# Patient Record
Sex: Female | Born: 1960 | Race: Black or African American | Hispanic: No | Marital: Married | Smoking: Never smoker
Health system: Southern US, Community
[De-identification: ages and names within clinical notes are randomized; demographics above are authoritative.]

## PROBLEM LIST (undated history)

## (undated) DIAGNOSIS — H9319 Tinnitus, unspecified ear: Secondary | ICD-10-CM

## (undated) DIAGNOSIS — E785 Hyperlipidemia, unspecified: Secondary | ICD-10-CM

## (undated) DIAGNOSIS — E559 Vitamin D deficiency, unspecified: Secondary | ICD-10-CM

## (undated) DIAGNOSIS — K5900 Constipation, unspecified: Secondary | ICD-10-CM

## (undated) DIAGNOSIS — I10 Essential (primary) hypertension: Secondary | ICD-10-CM

## (undated) DIAGNOSIS — G4730 Sleep apnea, unspecified: Secondary | ICD-10-CM

## (undated) DIAGNOSIS — R5383 Other fatigue: Secondary | ICD-10-CM

## (undated) DIAGNOSIS — M1990 Unspecified osteoarthritis, unspecified site: Secondary | ICD-10-CM

## (undated) DIAGNOSIS — R7303 Prediabetes: Secondary | ICD-10-CM

## (undated) DIAGNOSIS — M549 Dorsalgia, unspecified: Secondary | ICD-10-CM

## (undated) DIAGNOSIS — M7121 Synovial cyst of popliteal space [Baker], right knee: Secondary | ICD-10-CM

## (undated) DIAGNOSIS — I493 Ventricular premature depolarization: Secondary | ICD-10-CM

## (undated) DIAGNOSIS — E739 Lactose intolerance, unspecified: Secondary | ICD-10-CM

## (undated) DIAGNOSIS — L509 Urticaria, unspecified: Secondary | ICD-10-CM

## (undated) DIAGNOSIS — R002 Palpitations: Secondary | ICD-10-CM

## (undated) DIAGNOSIS — M791 Myalgia, unspecified site: Secondary | ICD-10-CM

## (undated) DIAGNOSIS — J45909 Unspecified asthma, uncomplicated: Secondary | ICD-10-CM

## (undated) DIAGNOSIS — R12 Heartburn: Secondary | ICD-10-CM

## (undated) DIAGNOSIS — K219 Gastro-esophageal reflux disease without esophagitis: Secondary | ICD-10-CM

## (undated) DIAGNOSIS — M2550 Pain in unspecified joint: Secondary | ICD-10-CM

## (undated) DIAGNOSIS — R0602 Shortness of breath: Secondary | ICD-10-CM

## (undated) DIAGNOSIS — K589 Irritable bowel syndrome without diarrhea: Secondary | ICD-10-CM

## (undated) HISTORY — DX: Unspecified asthma, uncomplicated: J45.909

## (undated) HISTORY — PX: TONSILLECTOMY: SUR1361

## (undated) HISTORY — DX: Tinnitus, unspecified ear: H93.19

## (undated) HISTORY — DX: Irritable bowel syndrome, unspecified: K58.9

## (undated) HISTORY — DX: Palpitations: R00.2

## (undated) HISTORY — DX: Myalgia, unspecified site: M79.10

## (undated) HISTORY — DX: Essential (primary) hypertension: I10

## (undated) HISTORY — DX: Other fatigue: R53.83

## (undated) HISTORY — PX: SEPTOPLASTY: SUR1290

## (undated) HISTORY — PX: OTHER SURGICAL HISTORY: SHX169

## (undated) HISTORY — PX: TUBAL LIGATION: SHX77

## (undated) HISTORY — DX: Constipation, unspecified: K59.00

## (undated) HISTORY — DX: Heartburn: R12

## (undated) HISTORY — DX: Vitamin D deficiency, unspecified: E55.9

## (undated) HISTORY — DX: Synovial cyst of popliteal space (Baker), right knee: M71.21

## (undated) HISTORY — DX: Lactose intolerance, unspecified: E73.9

## (undated) HISTORY — DX: Shortness of breath: R06.02

## (undated) HISTORY — DX: Prediabetes: R73.03

## (undated) HISTORY — DX: Pain in unspecified joint: M25.50

## (undated) HISTORY — PX: TOTAL ABDOMINAL HYSTERECTOMY W/ BILATERAL SALPINGOOPHORECTOMY: SHX83

## (undated) HISTORY — DX: Sleep apnea, unspecified: G47.30

## (undated) HISTORY — DX: Dorsalgia, unspecified: M54.9

## (undated) HISTORY — PX: NEUROMA SURGERY: SHX722

## (undated) HISTORY — PX: SINOSCOPY: SHX187

## (undated) HISTORY — DX: Hyperlipidemia, unspecified: E78.5

## (undated) HISTORY — DX: Ventricular premature depolarization: I49.3

## (undated) HISTORY — DX: Urticaria, unspecified: L50.9

## (undated) HISTORY — PX: APPENDECTOMY: SHX54

---

## 1998-11-14 ENCOUNTER — Other Ambulatory Visit: Admission: RE | Admit: 1998-11-14 | Discharge: 1998-11-14 | Payer: Self-pay | Admitting: Obstetrics and Gynecology

## 1999-11-15 ENCOUNTER — Other Ambulatory Visit: Admission: RE | Admit: 1999-11-15 | Discharge: 1999-11-15 | Payer: Self-pay | Admitting: Obstetrics and Gynecology

## 1999-11-23 ENCOUNTER — Ambulatory Visit (HOSPITAL_COMMUNITY): Admission: RE | Admit: 1999-11-23 | Discharge: 1999-11-23 | Payer: Self-pay | Admitting: Obstetrics and Gynecology

## 1999-11-23 ENCOUNTER — Encounter: Payer: Self-pay | Admitting: Obstetrics and Gynecology

## 2000-07-11 ENCOUNTER — Inpatient Hospital Stay (HOSPITAL_COMMUNITY): Admission: EM | Admit: 2000-07-11 | Discharge: 2000-07-13 | Payer: Self-pay | Admitting: Emergency Medicine

## 2000-07-11 ENCOUNTER — Encounter: Payer: Self-pay | Admitting: Internal Medicine

## 2000-07-11 ENCOUNTER — Encounter (INDEPENDENT_AMBULATORY_CARE_PROVIDER_SITE_OTHER): Payer: Self-pay | Admitting: Specialist

## 2000-12-18 ENCOUNTER — Other Ambulatory Visit: Admission: RE | Admit: 2000-12-18 | Discharge: 2000-12-18 | Payer: Self-pay | Admitting: Obstetrics and Gynecology

## 2001-01-14 ENCOUNTER — Encounter: Payer: Self-pay | Admitting: Obstetrics and Gynecology

## 2001-01-14 ENCOUNTER — Ambulatory Visit (HOSPITAL_COMMUNITY): Admission: RE | Admit: 2001-01-14 | Discharge: 2001-01-14 | Payer: Self-pay | Admitting: Obstetrics and Gynecology

## 2001-03-06 ENCOUNTER — Inpatient Hospital Stay (HOSPITAL_COMMUNITY): Admission: RE | Admit: 2001-03-06 | Discharge: 2001-03-08 | Payer: Self-pay | Admitting: Obstetrics and Gynecology

## 2001-03-06 ENCOUNTER — Encounter (INDEPENDENT_AMBULATORY_CARE_PROVIDER_SITE_OTHER): Payer: Self-pay

## 2002-02-17 ENCOUNTER — Other Ambulatory Visit: Admission: RE | Admit: 2002-02-17 | Discharge: 2002-02-17 | Payer: Self-pay | Admitting: Obstetrics and Gynecology

## 2002-02-19 ENCOUNTER — Ambulatory Visit (HOSPITAL_COMMUNITY): Admission: RE | Admit: 2002-02-19 | Discharge: 2002-02-19 | Payer: Self-pay | Admitting: Obstetrics and Gynecology

## 2002-02-19 ENCOUNTER — Encounter: Payer: Self-pay | Admitting: Obstetrics and Gynecology

## 2003-04-08 ENCOUNTER — Other Ambulatory Visit: Admission: RE | Admit: 2003-04-08 | Discharge: 2003-04-08 | Payer: Self-pay | Admitting: Obstetrics and Gynecology

## 2003-04-20 ENCOUNTER — Ambulatory Visit (HOSPITAL_COMMUNITY): Admission: RE | Admit: 2003-04-20 | Discharge: 2003-04-20 | Payer: Self-pay | Admitting: Obstetrics and Gynecology

## 2003-09-14 ENCOUNTER — Ambulatory Visit (HOSPITAL_COMMUNITY): Admission: RE | Admit: 2003-09-14 | Discharge: 2003-09-14 | Payer: Self-pay | Admitting: Otolaryngology

## 2003-09-14 ENCOUNTER — Ambulatory Visit (HOSPITAL_BASED_OUTPATIENT_CLINIC_OR_DEPARTMENT_OTHER): Admission: RE | Admit: 2003-09-14 | Discharge: 2003-09-14 | Payer: Self-pay | Admitting: Otolaryngology

## 2003-09-14 ENCOUNTER — Encounter (INDEPENDENT_AMBULATORY_CARE_PROVIDER_SITE_OTHER): Payer: Self-pay | Admitting: Specialist

## 2004-04-14 ENCOUNTER — Other Ambulatory Visit: Admission: RE | Admit: 2004-04-14 | Discharge: 2004-04-14 | Payer: Self-pay | Admitting: Obstetrics and Gynecology

## 2004-05-16 ENCOUNTER — Ambulatory Visit (HOSPITAL_COMMUNITY): Admission: RE | Admit: 2004-05-16 | Discharge: 2004-05-16 | Payer: Self-pay | Admitting: Obstetrics and Gynecology

## 2005-06-08 ENCOUNTER — Other Ambulatory Visit: Admission: RE | Admit: 2005-06-08 | Discharge: 2005-06-08 | Payer: Self-pay | Admitting: Obstetrics and Gynecology

## 2005-06-14 ENCOUNTER — Ambulatory Visit (HOSPITAL_COMMUNITY): Admission: RE | Admit: 2005-06-14 | Discharge: 2005-06-14 | Payer: Self-pay | Admitting: Obstetrics and Gynecology

## 2006-09-19 ENCOUNTER — Ambulatory Visit (HOSPITAL_COMMUNITY): Admission: RE | Admit: 2006-09-19 | Discharge: 2006-09-19 | Payer: Self-pay | Admitting: Obstetrics and Gynecology

## 2007-07-10 ENCOUNTER — Encounter: Admission: RE | Admit: 2007-07-10 | Discharge: 2007-07-10 | Payer: Self-pay | Admitting: Family Medicine

## 2008-04-30 ENCOUNTER — Emergency Department (HOSPITAL_COMMUNITY): Admission: EM | Admit: 2008-04-30 | Discharge: 2008-04-30 | Payer: Self-pay | Admitting: Emergency Medicine

## 2009-01-05 ENCOUNTER — Ambulatory Visit (HOSPITAL_COMMUNITY): Admission: RE | Admit: 2009-01-05 | Discharge: 2009-01-05 | Payer: Self-pay | Admitting: Obstetrics and Gynecology

## 2009-01-12 ENCOUNTER — Encounter: Admission: RE | Admit: 2009-01-12 | Discharge: 2009-01-12 | Payer: Self-pay | Admitting: Obstetrics and Gynecology

## 2010-02-14 ENCOUNTER — Ambulatory Visit (HOSPITAL_COMMUNITY)
Admission: RE | Admit: 2010-02-14 | Discharge: 2010-02-14 | Payer: Self-pay | Source: Home / Self Care | Admitting: Obstetrics and Gynecology

## 2010-04-11 ENCOUNTER — Other Ambulatory Visit (HOSPITAL_COMMUNITY): Payer: Self-pay | Admitting: Gastroenterology

## 2010-04-11 DIAGNOSIS — R110 Nausea: Secondary | ICD-10-CM

## 2010-04-24 ENCOUNTER — Other Ambulatory Visit (HOSPITAL_COMMUNITY): Payer: Self-pay

## 2010-07-04 LAB — BASIC METABOLIC PANEL
BUN: 11 mg/dL (ref 6–23)
Chloride: 102 mEq/L (ref 96–112)
Potassium: 3.6 mEq/L (ref 3.5–5.1)
Sodium: 138 mEq/L (ref 135–145)

## 2010-07-04 LAB — POCT CARDIAC MARKERS
CKMB, poc: <1 ng/mL — ABNORMAL LOW (ref 1.0–8.0)
Troponin i, poc: <0.05 ng/mL (ref 0.00–0.09)

## 2010-07-04 LAB — DIFFERENTIAL
Eosinophils Absolute: 0.4 10*3/uL (ref 0.0–0.7)
Eosinophils Relative: 3 % (ref 0–5)
Lymphs Abs: 1.7 10*3/uL (ref 0.7–4.0)
Monocytes Absolute: 0.9 10*3/uL (ref 0.1–1.0)
Monocytes Relative: 6 % (ref 3–12)

## 2010-07-04 LAB — CBC
HCT: 38.5 % (ref 36.0–46.0)
Hemoglobin: 13.1 g/dL (ref 12.0–15.0)
MCV: 91.5 fL (ref 78.0–100.0)
Platelets: 377 10*3/uL (ref 150–400)
WBC: 14.8 10*3/uL — ABNORMAL HIGH (ref 4.0–10.5)

## 2010-07-04 LAB — MAGNESIUM: Magnesium: 2.2 mg/dL (ref 1.5–2.5)

## 2010-08-04 NOTE — Op Note (Signed)
[facility]  Patient:    [name], [name]                       MRN: [id number] Proc. Date: 07/11/00 Adm. Date:  [id number] Disc. Date: [id number] Attending:  Chevis Pretty S                           Operative Report  PREOPERATIVE DIAGNOSIS:  Acute appendicitis.  POSTOPERATIVE DIAGNOSIS:  Acute appendicitis.  PROCEDURE:  Laparoscopic appendectomy.  SURGEON:  Ollen Gross. Vernell Morgans, M.D.  ANESTHESIA:  General endotracheal.  DESCRIPTION OF PROCEDURE:  After informed consent was obtained, the patient was brought to the operating room and placed in a supine position on the operating table.  After induction of general anesthesia, the patients abdomen was prepped with Betadine and draped in the usual sterile manner.  A small vertically oriented supraumbilical incision was made with a 15 blade knife after infiltrating this area with 0.25% Marcaine.  The incision was carried down through the subcutaneous tissue bluntly using the Kelly clamp and Army-Navy retractors until the linea alba was identified.  The linea alba was incised with the 15 blade knife and each side was grasped with Kocher clamps and elevated.  The preperitoneal space was then probed bluntly with a hemostat until the peritoneum was opened and access was gained to the abdominal cavity. A finger was inserted through this hole, and palpation of the abdominal wall did not indicate there were any adhesions to the anterior abdominal wall.  A 0 Vicryl pursestring stitch was placed in the fascia around this opening.  A Hasson cannula was placed through this opening into the abdominal cavity and anchored with the previously placed Vicryl pursestring stitch.  The abdomen was then insufflated with carbon dioxide.  The patient was placed in the Trendelenburg position and a laparoscope was inserted through the Hasson cannula.  Next a site was chosen on the lower abdominal wall just to the left of midline for a 12  mm port.  This area was infiltrated with 0.25% Marcaine. An incision was made in the skin overlying this area.  A 12 mm port was then placed bluntly through this incision into the abdominal cavity under direct vision.  A Glassman grasper was then placed through this port, and the right lower quadrant was inspected until the appendix was found.  The appendix had several attachments to the lateral abdominal wall and retroperitoneum. Another site just to the left of midline, just below the umbilicus was chosen again for a 5 mm port, and this area was infiltrated with 0.25% Marcaine.  A small incision was made in the skin, and a 5 mm port was placed bluntly through this incision into the abdominal cavity under direct vision.  The Kingsley Spittle was then used to elevate the appendix, and the attachments of the appendix to the retroperitoneum and lateral abdominal wall were taken down with a harmonic scalpel until the appendix was freed and its connection with the cecum was identified.  The mesoappendix was also taken down with the harmonic scalpel.  Once this was complete and the base of the appendix was clear of any other tissue, the Kingsley Spittle was moved to the 5 mm port, and an endoscopic stapler was placed through the 12 mm port.  The stapler was placed across the base of the appendix and clamped and fired.  The endoscopic stapler was then removed, and an endoscopic bag  was placed through the 12 mm port. The appendix was placed within the bag and enclosed.  The anchoring string was then cut with the laparoscopic scissors.  The gallbladder grasper was then placed through the Hasson port, and the camera was moved to the 12 mm port. The bag was grasped with the gallbladder grasper and removed through the supraumbilical port with the Hasson cannula.  The site cannula was then replaced into the abdominal cavity.  The abdomen was then inspected, and the cecum where the appendix had been was also inspected  and found to be hemostatic.  The abdomen was then irrigated with copious amounts of saline until the effluent was clear.  The ports were then removed under direct vision, and the fascia at the supraumbilical port was closed with the previously placed Vicryl pursestring stitch.  The patient tolerated the procedure well.  The skin was closed with 4-0 Monocryl and subcuticular stitches.  Benzoin and Steri-Strips were applied.  The patient tolerated the procedure well.  At the end of the case, all needle, sponge, and instrument counts were correct.  The patient was awakened and taken to the recovery room in stable condition. DD:  07/16/00 TD:  07/16/00 Job: [id number]

## 2010-08-04 NOTE — Discharge Summary (Signed)
[facility]  Patient:    [name], [name] Visit Number: [id number] MRN: [id number]          Service Type: GYN Location: 9300 [id number] Attending Physician:  Shaune Spittle Dictated by:   Henreitta Leber, P.A. Admit Date:  03/06/2001 Discharge Date: 03/08/2001                             Discharge Summary  DISCHARGE DIAGNOSES:          1. Symptomatic uterine fibroids.                               2. Menorrhagia.                               3. Dysmenorrhea.                               4. Dyspareunia.  OPERATIONS:                   On the date of admission the patient underwent a total abdominal hysterectomy, tolerating the procedure well.  HISTORY OF PRESENT ILLNESS:   [name] is a 50 year old African-American female, para 3-0-3-3, who was admitted for definitive therapy of symptomatic uterine fibroids in the form of a total abdominal hysterectomy.  Please see the patients dictated history and physical exam for details.  PHYSICAL EXAMINATION:  VITAL SIGNS:                  Blood pressure 120/80.  GENERAL:                      Within normal limits with the exception of her abdominal exam, which revealed a soft mass that extends halfway between the symphysis pubis and the umbilicus.  PELVIC:                       EGBUS within normal limits.  Vagina is rugous. Cervix is without gross lesions.  Uterus is approximately 14-16 weeks size, mobile, nontender, and irregular.  Adnexa without any separable masses.  RECTOVAGINAL:                 No masses.  HOSPITAL COURSE:              On the date of admission the patient underwent aforementioned procedure, tolerating it well.  Postoperative course was unremarkable, with the patient quickly resuming bowel and bladder function by postoperative day #2, and deemed ready for discharge home.  Postoperative hemoglobin 8.3 (preoperative hemoglobin 9.5).  DISCHARGE MEDICATIONS:        1.  Vicodin 1-2 tablets every four hours as                                  needed for pain.                               2. Ibuprofen 600 mg 1 tablet with food every  six hours for five days, then as needed.                               3. Iron 325 mg 1 tablet twice daily for six                                  weeks.                               4. Colace 100 mg 1 tablet twice daily until                                  bowel movements are regular.                               5. Phenergan 12.5 mg 1 tablet four times a day                                  if needed for nausea.  FOLLOW-UP:                    The patient is to call [facility] for staple removal appointment on March 11, 2001, and a six-week postoperative exam with Dr. Pennie Rushing.  DISCHARGE INSTRUCTIONS:       The patient was given a copy of [facility] postoperative instruction sheet.  She was further advised to avoid driving for two weeks, heavy lifting for four weeks, and intercourse for six weeks.  PATHOLOGY:                    Final pathology of uterus and cervix:  Cervix with no pathologic abnormalities identified.  Benign proliferative endometrium.  Leiomyomata uterine intramural and subserosal.  Separately submitted fragments of benign squamous mucosa. Dictated by:   Henreitta Leber, P.A. Attending Physician:  Shaune Spittle DD:  03/21/01 TD:  03/22/01 Job: [id number]

## 2010-08-04 NOTE — Op Note (Signed)
[name], [name]                          ACCOUNT NO.:  [id number]   MEDICAL RECORD NO.:  [id number]                   PATIENT TYPE:  AMB   LOCATION:  DSC                                  FACILITY:  [facility]   PHYSICIAN:  Hermelinda Medicus, M.D.                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   OPERATION:  Septal reconstruction, turbinate reduction, and a laser assisted  uvulopalatal plasty.   SURGEON:  Hermelinda Medicus, M.D.   ANESTHESIA:  Local 1% Xylocaine with epinephrine and topical cocaine 200 mg,  Cetacaine oral spray with MAC standby with Dr. Randa Evens.   SURGEON:  Hermelinda Medicus, M.D.   PROCEDURE:  The patient was placed in the supine position.  Under local  anesthesia, she was prepped and draped in the usual manner using Hibiclens  and the usual head drape.  The nose was first approached where, once  anesthesia was instilled, a hemitransfixion incision was made on the right  side, carried around the columella to the left, and back along the septal  quadrilateral cartilage going back to the posterior quadrilateral cartilage  and took a strip of posterior cartilage.  We also used the open and closed  Jansen-Middleton to take a strip of the ethmoid septal deviation and once  this was brought under control, we then approached the inferior aspect where  we took a strip of cartilage off the premaxillary crest as it was off the  crest into the left side of the nose.  She also had a vomerine septal  deviation which will be corrected.  We brought the septum  back to the  midline but could not  completely do so until we reduced the middle turbinate  which was a concha bullosa on the right side gaining further space for her  septum.  We also reduced her inferior turbinates by out fracturing using the  butter knife but did not have to remove any mucous membrane.  We were then  able to bring the septum to the midline and then closure was with 5-0 plain  catgut and through and through septal suture x 2 using 4-0 plain catgut as a  blanket stitch.  Once this was completed, we then approached the oral cavity  where the oral cavity was sprayed with Cetacaine and 1% Xylocaine with  epinephrine was used and then lateral incisions were made using the laser at  10 watts for four minutes.  We also trimmed the excessive size uvula to its  more normal size.  Once these incisions were made and the  uvula was trimmed  and all hemostasis was established, we then discontinued the procedure.  During this procedure, the patient's eyes were covered with wet 4 by 4s and  a wet towel and all members of the operating room wore special  glasses.  The attention was turned back to the nose where the nasopharynx  was again suctioned and Telfa intranasal dressing was placed.  The patient  tolerated the procedure well and is doing well postop.  Her follow up will  be tomorrow in the office and then in ten days, three weeks, six weeks,  three months, six months, and a year.                                               Hermelinda Medicus, M.D.    JC/MEDQ  D:  09/14/2003  T:  09/14/2003  Job:  [id number]   cc:   Otilio Connors. Gerri Spore, M.D.  [address]  [place]  [place] [address]  Fax: [phone]   Meade Maw, M.D.  [address]  [place]  [place] [address]  Fax: [phone]

## 2010-08-04 NOTE — Op Note (Signed)
[facility]  Patient:    [name], [name] Visit Number: [id number] MRN: [id number]          Service Type: GYN Location: 9300 [id number] Attending Physician:  Shaune Spittle Dictated by:   Maris Berger. Pennie Rushing, M.D. Proc. Date: 03/06/01 Admit Date:  03/06/2001                             Operative Report  PREOPERATIVE DIAGNOSES:       Symptomatic uterine fibroids, menorrhagia, dysmenorrhea, dyspareunia, anemia.  POSTOPERATIVE DIAGNOSES:      Symptomatic uterine fibroids, menorrhagia, dysmenorrhea, dyspareunia, anemia.  OPERATION:                    Total abdominal hysterectomy.  ANESTHESIA:                   General orotracheal.  ESTIMATED BLOOD LOSS:         300 cc.  COMPLICATIONS:                None.  SURGEON:                      Vanessa P. Pennie Rushing, M.D.  FIRST ASSISTANT:              Elmira J. Lowell Guitar, PA-C  FINDINGS:                     The uterus was enlarged to approximately 14 weeks size with a 10 cm posterior uterine fibroid.  The ovaries were normal bilaterally.  The tubes were status post interruption for tubal sterilization.  PROCEDURE:                    The patient was taken to the operating room after appropriate identification and placed on the operating table.  After the attainment of adequate general anesthesia, the abdomen, perineum, and the vagina were prepped in multiple layers of Betadine and a Foley catheter inserted into the bladder, then connected to straight drainage.  The abdomen was draped as a sterile field.  A transverse incision was made in the abdomen and the abdomen opened in layers.  Peritoneum was entered.  Examination of the abdomen revealed the above noted findings.  A self retaining OConnor OSullivan retractor was placed and the bowel packed cephalad.  The uterus was elevated with Kelly clamps at each cornual region.  The left round ligament was then suture ligated and incised and that incision  taken anteriorly on the anterior leaf of the broad ligament.  The utero-ovarian ligament was then isolated, clamped, cut, suture ligated, and tied with a free tie.  This procedure was carried out on the right side with the round ligament and utero-ovarian ligament.  The bladder was dissected off the anterior cervix with a combination of blunt and sharp dissection.  The uterine arteries were skeletonized, clamped, cut, and suture ligated on the right and left sides. The uterine fundus was then excised from the cervix and removed from the operative field.  The paracervical tissues were clamped, cut, and suture ligated down to the level of the uterosacral ligaments.  The uterosacral ligaments were then clamped, cut, and suture ligated and those sutures held. The vaginal angles were clamped, cut, and suture ligated and the cervix excised from the upper vagina.  The vaginal cuff was closed with figure-of-eight sutures.  Hemostasis was achieved  with a figure-of-eight suture of 0 Vicryl in the right pelvic side wall.  Copious irrigation was carried out.  Moschcowitz sutures were placed in the posterior cul-de-sac connecting the two uterosacral ligaments and tying them together with the intervening posterior pelvic peritoneum.  The round ligaments which had been held were then tied to the uterosacral ligaments and all instruments removed from the abdominal cavity as hemostasis was noted to be adequate and copious irrigation was carried out.  All sutures up to this point had been 0 Vicryl. The abdominal peritoneum was then closed with a running suture of 2-0 Vicryl. The rectus muscles were irrigated and made hemostatic with Bovie cautery.  The fascia was closed with running sutures of 0 Vicryl from each apex to the midline and tied in the midline.  The subcutaneous tissue was irrigated and made hemostatic with Bovie cautery.  Skin staples were applied to the skin incision.  A sterile dressing was  applied.  The patient was awakened from general anesthesia and taken to the recovery room in satisfactory condition having tolerated the procedure well with sponge and instrument counts correct.  SPECIMEN:                     Uterus and cervix. Dictated by:   Maris Berger. Pennie Rushing, M.D. Attending Physician:  Shaune Spittle DD:  03/06/01 TD:  03/07/01 Job: [id number]

## 2010-08-04 NOTE — H&P (Signed)
[facility]  Patient:    [name], [name] Visit Number: [id number] MRN: [id number]          Service Type: Attending:  Maris Berger. Pennie Rushing, M.D. Dictated by:   Maris Berger. Haygood, M.D. Adm. Date:  03/06/01                           History and Physical  HISTORY OF PRESENT ILLNESS:   The patient is a 50 year old black female, para 3-0-3-3, admitted for definitive therapy of symptomatic uterine fibroids.  The patient has a diagnosis of uterine fibroids made in 2000 at which time she was noted to have a uterus that was approximately 12-week size.  She was, at that time, asymptomatic, but did note that she had a strong family history of uterine fibroids.  Over the last year, the patient has had significant increases in her symptoms with increasingly severe menometrorrhagia, dysmenorrhea and dyspareunia.  She was evaluated in October 2002 with a pelvic examination and pelvic ultrasound showing the uterine length to be 15.2 cm with a large fibroid in the posterior uterine fundus measuring approximately 7 cm and thought to be intramural.  Since that time, the patient has made the decision that she wants to undergo definitive therapy for her uterine fibroids.  She has also been noted to be anemic with a hemoglobin of 10.2 on December 18, 2000 and has been placed on iron therapy for this.  PAST HISTORY:                 Gynecologic - the patient had a tubal ligation in 1997 for surgical sterilization.                                Obstetrical - the patient has had three spontaneous abortions and has had one cesarean section, followed by two vaginal deliveries.  MEDICAL HISTORY:              The patient does have chronic hypertension.  SURGICAL HISTORY:             The patient had an appendectomy in 2001 and had a cesarean section in 1987 for breech presentation.  CURRENT MEDICATIONS:          Hydrochlorothiazide and Toprol, vitamin E.  DRUG  SENSITIVITIES:           None known.  FAMILY HISTORY:               Positive for several female members with uterine fibroids all of whom are status post hysterectomy.  The patient also has a positive family history for diabetes mellitus and hypertension.  Her eldest daughter has a cleft lip and palate.  REVIEW OF SYSTEMS:            As mentioned above with dysmenorrhea, menorrhagia, and dyspareunia.  The patient specifically denies unusual fatigue.  The patient does have a history of mitral valve prolapse.  PHYSICAL EXAMINATION:  VITAL SIGNS:                  Blood pressure is 120/80.  LUNGS:                        Clear.  HEART:                        Regular  rate and rhythm.  ABDOMEN:                      Soft with a mass that extends halfway between the symphysis pubis and the umbilicus.  EXTREMITIES:                  No clubbing, cyanosis, or edema.  PELVIC:                       EGBUS within normal limits.  The vagina is rugous.  The cervix is without gross lesions.  The uterus is approximately 14-16 weeks size, mobile, nontender, and irregular.  Adnexa no ______ masses. Rectovaginal - no masses.  IMPRESSION:                   1. Symptomatic uterine fibroids.                               2. Menorrhagia.                               3. Dysmenorrhea.                               4. Dyspareunia.  DISPOSITION:                  Several discussions had been held with the patient concerning options for management of her uterine fibroids, including observation, uterine artery embolization, Depo Lupron, and myomectomy.  The patient has decided she wants definitive therapy of her uterine fibroids and opted for hysterectomy.  She will thus undergo a total abdominal hysterectomy at [facility] on March 06, 2001. Dictated by:   Maris Berger. Pennie Rushing, M.D. Attending:  Maris Berger. Pennie Rushing, M.D. DD:  03/05/01 TD:  03/05/01 Job: [id number]

## 2010-08-04 NOTE — H&P (Signed)
[name], [name]                          ACCOUNT NO.:  [id number]   MEDICAL RECORD NO.:  [id number]                   PATIENT TYPE:  AMB   LOCATION:  DSC                                  FACILITY:  [facility]   PHYSICIAN:  Hermelinda Medicus, M.D.                DATE OF BIRTH:  1960-05-03   DATE OF ADMISSION:  09/14/2003  DATE OF DISCHARGE:                                HISTORY & PHYSICAL   This patient is a 50 year old female who entered with a history of severe  snoring.  She has been awakened in her sleep but her husband stating that  she is not obstructing, she just has a very loud snoring situation.  She has  a nasal obstruction also secondary to septal deviation, turbinate  hypertrophy, and she has not had a sleep study but by history, I think we  can establish that she is not a sleep apnea patient.  She now is planning to  have a septal reconstruction, turbinate reduction, with an LAUP, a laser-  assisted uvulopalatoplasty.   Her past history is one of no allergies to medications.   She takes Toprol 100 mg daily and Oxytrol patch 3.9 mg.   She does not drink and has never smoked.  She has had a hysterectomy,  appendectomy, and foot surgery x2.  She does have hypertension and a murmur  with Dr. Fraser Din.  She wears glasses.  She has no pulmonary or neurologic  problems.  The remainder of her review of symptoms is completely  unremarkable.   PHYSICAL EXAMINATION:  VITAL SIGNS:  Blood pressure of 159/89.  She weighs  199 and is 5 feet 4 inches.  Her heart rate is 58.  HEENT:  Her ears are clear.  The tympanic membranes are clear.  The nose  shows a septal deviation primary to the right but then swings back to the  left, and then she has a concha bullosa on the right side with increased-  size turbinates inferiorly.  NECK:  Free of any thyromegaly or cervical adenopathy or mass.  CARDIAC:  She was seen at one time having a murmur but then was evaluated by  Dr. Fraser Din, and heard  no murmurs.  On examinations here, I heard no murmur,  no opening snaps or gallops.  CHEST:  Clear, no rales, rhonchi, or wheezes.  ABDOMEN:  Unremarkable.   The EKG was unremarkable and within normal limits.  The history with her  cardiac status was having some palpitations with some arrhythmias.  She went  through a complete workup for her palpitations, had an ejection fraction of  72%, normal chamber dimension, normal systolic function, and normal valvular  morphology.   FINAL DIAGNOSIS:  Septal deviation, turbinate hypertrophy, with redundant  uvula and palate.  Her larynx is also clear; true cords, false cords,  epiglottis, base of tongue are clear on physical examination.   Our plan is  to do an LAUP, septal reconstruction, and turbinate reduction.                                                Hermelinda Medicus, M.D.    JC/MEDQ  D:  09/14/2003  T:  09/14/2003  Job:  [id number]   cc:   Meade Maw, M.D.  [address]  [place]  [place] [address]  Fax: [phone]   Otilio Connors. Gerri Spore, M.D.  [address]  [place]  [place] [address]  Fax: [phone]

## 2011-11-27 ENCOUNTER — Other Ambulatory Visit: Payer: Self-pay | Admitting: Obstetrics and Gynecology

## 2011-11-27 DIAGNOSIS — Z1231 Encounter for screening mammogram for malignant neoplasm of breast: Secondary | ICD-10-CM

## 2011-12-07 ENCOUNTER — Ambulatory Visit (HOSPITAL_COMMUNITY)
Admission: RE | Admit: 2011-12-07 | Discharge: 2011-12-07 | Disposition: A | Discharge status: Home / Self Care | Payer: Self-pay | Source: Ambulatory Visit | Attending: Obstetrics and Gynecology | Admitting: Obstetrics and Gynecology

## 2011-12-07 DIAGNOSIS — Z1231 Encounter for screening mammogram for malignant neoplasm of breast: Secondary | ICD-10-CM

## 2013-11-02 ENCOUNTER — Other Ambulatory Visit: Payer: Self-pay | Admitting: Obstetrics and Gynecology

## 2013-11-02 DIAGNOSIS — Z1231 Encounter for screening mammogram for malignant neoplasm of breast: Secondary | ICD-10-CM

## 2013-11-10 ENCOUNTER — Ambulatory Visit (HOSPITAL_COMMUNITY)
Admission: RE | Admit: 2013-11-10 | Discharge: 2013-11-10 | Disposition: A | Discharge status: Home / Self Care | Payer: BC Managed Care – PPO | Source: Ambulatory Visit | Attending: Obstetrics and Gynecology | Admitting: Obstetrics and Gynecology

## 2013-11-10 DIAGNOSIS — Z1231 Encounter for screening mammogram for malignant neoplasm of breast: Secondary | ICD-10-CM

## 2014-02-05 ENCOUNTER — Encounter: Payer: Self-pay | Admitting: Cardiology

## 2014-02-05 ENCOUNTER — Ambulatory Visit (INDEPENDENT_AMBULATORY_CARE_PROVIDER_SITE_OTHER): Payer: BC Managed Care – PPO | Admitting: Cardiology

## 2014-02-05 ENCOUNTER — Other Ambulatory Visit: Payer: Self-pay | Admitting: Cardiology

## 2014-02-05 VITALS — BP 160/84 | HR 78 | Ht 64.0 in | Wt 215.0 lb

## 2014-02-05 DIAGNOSIS — I10 Essential (primary) hypertension: Secondary | ICD-10-CM | POA: Insufficient documentation

## 2014-02-05 DIAGNOSIS — I493 Ventricular premature depolarization: Secondary | ICD-10-CM

## 2014-02-05 DIAGNOSIS — E785 Hyperlipidemia, unspecified: Secondary | ICD-10-CM

## 2014-02-05 LAB — BASIC METABOLIC PANEL
BUN: 10 mg/dL (ref 6–23)
CALCIUM: 9.2 mg/dL (ref 8.4–10.5)
CO2: 28 mEq/L (ref 19–32)
CREATININE: 0.72 mg/dL (ref 0.50–1.10)
Chloride: 98 mEq/L (ref 96–112)
GLUCOSE: 98 mg/dL (ref 70–99)
Potassium: 3.4 mEq/L — ABNORMAL LOW (ref 3.5–5.3)
Sodium: 138 mEq/L (ref 135–145)

## 2014-02-05 MED ORDER — NEBIVOLOL HCL 2.5 MG PO TABS: 2.5000 mg | ORAL_TABLET | Freq: Every day | ORAL | Status: DC

## 2014-02-05 NOTE — Patient Instructions (Addendum)
Your physician has recommended you make the following change in your medication:  1) START Bystolic 2.5 mg daily  Your physician recommends that you have lab work TODAY (BMET).  Your physician recommends that you schedule a follow-up appointment in: 4 weeks with Dr. Mayford Knifeurner.

## 2014-02-05 NOTE — Progress Notes (Signed)
[address] [place], [place]  [address] Phone: [phone] Fax:  [phone]  Date:  02/05/2014   ID:  [name], DOB 12/10/1960, MRN [id number]  PCP:  Frederich ChickWEBB, CAROL, D, MD  Cardiologist:  Armanda Magicraci Jyaire Koudelka, MD    History of Present Illness: [name] is a 53 y.o. female with a history of HTN, dyslipidemia and palpitations with history of PVC's who presents today for followup.  She started taking estrogen replacement for menopause symptoms which triggered her PVC's so her dose was decreased and she continues to have palpitations.  She denies any chest pain, SOB, DOE, LE edema, dizziness or syncope.   Wt Readings from Last 3 Encounters:  02/05/14 215 lb (97.523 kg)     Past Medical History  Diagnosis Date  . Hypertension   . Hyperlipidemia   . Palpitations   . PVC (premature ventricular contraction)     Current Outpatient Prescriptions  Medication Sig Dispense Refill  . aspirin 81 MG chewable tablet Chew by mouth daily.    . cholecalciferol (VITAMIN D) 1000 UNITS tablet Take 1,000 Units by mouth daily.    . cyclobenzaprine (FLEXERIL) 10 MG tablet Take 10 mg by mouth at bedtime.   0  . diltiazem (CARTIA XT) 240 MG 24 hr capsule Take 240 mg by mouth daily.    Marland Kitchen. estradiol (VIVELLE-DOT) 0.05 MG/24HR patch Place 1 patch onto the skin 2 (two) times a week.   0  . gabapentin (NEURONTIN) 600 MG tablet Take 600 mg by mouth 2 (two) times daily.   0  . hydrochlorothiazide (HYDRODIURIL) 25 MG tablet Take 25 mg by mouth daily.   0  . simvastatin (ZOCOR) 40 MG tablet Take 40 mg by mouth daily. TAKE A HALF OF TABLET    . venlafaxine XR (EFFEXOR-XR) 37.5 MG 24 hr capsule Take 37.5 mg by mouth.   2   No current facility-administered medications for this visit.    Allergies:    Allergies  Allergen Reactions  . Morphine And Related Itching    Social History:  The patient  reports that she has never smoked. She does not have any smokeless tobacco history on file. She  reports that she drinks alcohol.   Family History:  The patient's family history includes Hyperlipidemia in her brother and sister.   ROS:  Please see the history of present illness.      All other systems reviewed and negative.   PHYSICAL EXAM: VS:  BP 160/84 mmHg  Pulse 78  Ht 5\' 4"  (1.626 m)  Wt 215 lb (97.523 kg)  BMI 36.89 kg/m2 Well nourished, well developed, in no acute distress HEENT: normal Neck: no JVD Cardiac:  normal S1, S2; RRR; no murmur Lungs:  clear to auscultation bilaterally, no wheezing, rhonchi or rales Abd: soft, nontender, no hepatomegaly Ext: no edema Skin: warm and dry Neuro:  CNs 2-12 intact, no focal abnormalities noted  EKG:  NSR with occasional PVC's, nonspecific T wave abnormality     ASSESSMENT AND PLAN:  1. PVC's - increased after going on estrogen replacement.  I have recommended that she stop the estrogen replacement therapy especially in light of her elevated BP.  Check BMET to make sure potassium is ok.  2. HTN with elevated BP - may be due to estrogen replacement therapy. Continue HCTZ and Diltiazem.  I will add Bystolic 2.5mg  daily which will help with HTN as well as PVC suppression. 3. Dyslipidemia.  Continue simvastatin.  Followup with me  in 4 weeks  Signed, Armanda Magicraci Kepler Mccabe, MD [facility] 02/05/2014 3:58 PM

## 2014-02-08 ENCOUNTER — Telehealth: Payer: Self-pay

## 2014-02-08 DIAGNOSIS — I10 Essential (primary) hypertension: Secondary | ICD-10-CM

## 2014-02-08 MED ORDER — POTASSIUM CHLORIDE ER 20 MEQ PO TBCR: 20.0000 meq | EXTENDED_RELEASE_TABLET | Freq: Every day | ORAL | Status: DC

## 2014-02-08 NOTE — Telephone Encounter (Signed)
-----   Message from Quintella Reichertraci R Turner, MD sent at 02/06/2014  8:28 PM EST ----- Please add Kdur 20meq daily and recheck BMET In 1 week

## 2014-02-08 NOTE — Telephone Encounter (Signed)
Instructed patient to START 20 meq Kdur daily. Confirmed lab appointment for Monday, November 30. Patient agreeable.

## 2014-02-15 ENCOUNTER — Encounter: Payer: Self-pay | Admitting: Cardiology

## 2014-02-15 ENCOUNTER — Other Ambulatory Visit (INDEPENDENT_AMBULATORY_CARE_PROVIDER_SITE_OTHER): Payer: BC Managed Care – PPO

## 2014-02-15 DIAGNOSIS — I10 Essential (primary) hypertension: Secondary | ICD-10-CM

## 2014-02-16 LAB — BASIC METABOLIC PANEL
BUN: 14 mg/dL (ref 6–23)
CO2: 28 meq/L (ref 19–32)
Calcium: 9.4 mg/dL (ref 8.4–10.5)
Chloride: 104 mEq/L (ref 96–112)
Creatinine, Ser: 0.9 mg/dL (ref 0.4–1.2)
GFR: 80.63 mL/min (ref >60.00)
Glucose, Bld: 110 mg/dL — ABNORMAL HIGH (ref 70–99)
Potassium: 3.6 mEq/L (ref 3.5–5.1)
SODIUM: 140 meq/L (ref 135–145)

## 2014-02-25 ENCOUNTER — Telehealth: Payer: Self-pay | Admitting: Cardiology

## 2014-02-25 NOTE — Telephone Encounter (Signed)
Forwarding to Dr. Mayford Knifeurner

## 2014-02-25 NOTE — Telephone Encounter (Signed)
New Msg   Dr. Pennie RushingHaygood calling for Dr. Mayford Knifeurner in regards to patient. Please contact Dr. Pennie RushingHaygood at [phone].

## 2014-02-26 NOTE — Telephone Encounter (Signed)
Dr. Pennie RushingHaygood called to state that patient's dose of Cardizem was decreased from 300mg  daily to 240mg  daily which she did not relay to me when she saw me last.  Dr. Pennie RushingHaygood would like to put her back on transdermal estrogen which I told her was fine.  I started her on low dose bystolic and will see how her BP does on that.  Please get a copy of most recent stress test and echo [facility] the [facility].

## 2014-02-26 NOTE — Telephone Encounter (Signed)
To Medical Records to receive stress [facility].

## 2014-03-01 NOTE — Telephone Encounter (Signed)
[facility]

## 2014-03-23 ENCOUNTER — Ambulatory Visit: Payer: BC Managed Care – PPO | Admitting: Cardiology

## 2014-05-02 ENCOUNTER — Emergency Department (HOSPITAL_COMMUNITY): Payer: No Typology Code available for payment source

## 2014-05-02 ENCOUNTER — Emergency Department (HOSPITAL_COMMUNITY)
Admission: EM | Admit: 2014-05-02 | Discharge: 2014-05-03 | Disposition: A | Discharge status: Home / Self Care | Payer: No Typology Code available for payment source | Attending: Emergency Medicine | Admitting: Emergency Medicine

## 2014-05-02 ENCOUNTER — Encounter (HOSPITAL_COMMUNITY): Payer: Self-pay | Admitting: Vascular Surgery

## 2014-05-02 DIAGNOSIS — E785 Hyperlipidemia, unspecified: Secondary | ICD-10-CM | POA: Diagnosis not present

## 2014-05-02 DIAGNOSIS — R002 Palpitations: Secondary | ICD-10-CM | POA: Diagnosis present

## 2014-05-02 DIAGNOSIS — R42 Dizziness and giddiness: Secondary | ICD-10-CM | POA: Diagnosis not present

## 2014-05-02 DIAGNOSIS — Z7982 Long term (current) use of aspirin: Secondary | ICD-10-CM | POA: Diagnosis not present

## 2014-05-02 DIAGNOSIS — Z79899 Other long term (current) drug therapy: Secondary | ICD-10-CM | POA: Diagnosis not present

## 2014-05-02 DIAGNOSIS — I10 Essential (primary) hypertension: Secondary | ICD-10-CM | POA: Diagnosis not present

## 2014-05-02 LAB — I-STAT CHEM 8, ED
BUN: 16 mg/dL (ref 6–23)
CALCIUM ION: 1.14 mmol/L (ref 1.12–1.23)
CHLORIDE: 100 mmol/L (ref 96–112)
Creatinine, Ser: 0.9 mg/dL (ref 0.50–1.10)
GLUCOSE: 178 mg/dL — AB (ref 70–99)
HCT: 41 % (ref 36.0–46.0)
HEMOGLOBIN: 13.9 g/dL (ref 12.0–15.0)
POTASSIUM: 3 mmol/L — AB (ref 3.5–5.1)
Sodium: 139 mmol/L (ref 135–145)
TCO2: 23 mmol/L (ref 0–100)

## 2014-05-02 LAB — CBC WITH DIFFERENTIAL/PLATELET
Basophils Absolute: 0 10*3/uL (ref 0.0–0.1)
Basophils Relative: 0 % (ref 0–1)
Eosinophils Absolute: 0.4 10*3/uL (ref 0.0–0.7)
Eosinophils Relative: 4 % (ref 0–5)
HEMATOCRIT: 38.2 % (ref 36.0–46.0)
Hemoglobin: 12.5 g/dL (ref 12.0–15.0)
LYMPHS PCT: 40 % (ref 12–46)
Lymphs Abs: 4 10*3/uL (ref 0.7–4.0)
MCH: 29.9 pg (ref 26.0–34.0)
MCHC: 32.7 g/dL (ref 30.0–36.0)
MCV: 91.4 fL (ref 78.0–100.0)
MONOS PCT: 6 % (ref 3–12)
Monocytes Absolute: 0.6 10*3/uL (ref 0.1–1.0)
NEUTROS ABS: 5 10*3/uL (ref 1.7–7.7)
Neutrophils Relative %: 50 % (ref 43–77)
Platelets: 380 10*3/uL (ref 150–400)
RBC: 4.18 MIL/uL (ref 3.87–5.11)
RDW: 13.5 % (ref 11.5–15.5)
WBC: 10.1 10*3/uL (ref 4.0–10.5)

## 2014-05-02 LAB — I-STAT TROPONIN, ED: Troponin i, poc: 0 ng/mL (ref 0.00–0.08)

## 2014-05-02 MED ORDER — POTASSIUM CHLORIDE CRYS ER 20 MEQ PO TBCR
40.0000 meq | EXTENDED_RELEASE_TABLET | Freq: Two times a day (BID) | ORAL | Status: DC
  Administered 2014-05-02: 40 meq via ORAL
  Filled 2014-05-02: qty 2

## 2014-05-02 NOTE — ED Notes (Signed)
Patient transported to CT 

## 2014-05-02 NOTE — ED Notes (Signed)
Pt reports to the ED via [facility] for eval of dizziness, lightheadedness, palpitations, and SOB that occurred while she was watching TV. Per EMS she was sinus tach in the 120s at home. Reports she has a hx of atrial fibrillation but this was worse than her previous episodes. Pt NSR on the monitor. Denies any full LOC. Reports she attempted to have a BM at home but she was unable to do so. Denies any palpitations at this time but she does reports some SOB. Pt reports some chest pressure at this time. Pt A&Ox4, resp e/u, and skin warm and dry.

## 2014-05-02 NOTE — ED Provider Notes (Signed)
CSN: [id number]     Arrival date & time 05/02/14  2221 History   First MD Initiated Contact with Patient 05/02/14 2227     Chief Complaint  Patient presents with  . Palpitations     (Consider location/radiation/quality/duration/timing/severity/associated sxs/prior Treatment) HPI Comments: [name] states she was sitting at her desk and developed some dizziness and on knees in her abdomen.  She thought she had to use the restroom.  She walked to the restroom without incident.  Did not have a bowel movement and did not urinate so she walked to her bed where she sat on the edge of the bed and developed lightheadedness and palpitations.  This was present for approximately 30 minutes prior to calling EMS. She reports that she has had palpitations in the past.  She seen by Dr. Mayford Knife.  She does take hydrochlorothiazide by systolic and diltiazem, which she, states she's been taking a regular basis.  She last saw Dr. Mayford Knife in December 2015 with no change in her medications. Denies any recent illnesses, sinus congestion, URI symptoms, nausea, vomiting, diarrhea, been eating well.  She has not traveled recently.  She denies any peripheral edema Patient has recently started using her hormone patch, again with Dr. Norris Cross okay.  Does not feel that this is affecting her palpitations are  Patient is a 54 y.o. female presenting with palpitations. The history is provided by the patient.  Palpitations Palpitations quality:  Unable to specify Onset quality:  Sudden Duration:  1 hour Timing:  Constant Progression:  Unchanged Chronicity:  Recurrent Context: not anxiety, not bronchodilators, not dehydration, not exercise, not hyperventilation, not nicotine and not stimulant use   Relieved by:  Nothing Worsened by:  Nothing Ineffective treatments:  None tried Associated symptoms: chest pressure, dizziness and weakness   Associated symptoms: no chest pain, no cough, no diaphoresis, no leg pain, no lower  extremity edema, no nausea, no numbness, no orthopnea, no PND, no shortness of breath, no syncope and no vomiting   Dizziness:    Severity:  Moderate   Duration:  1 hour   Timing:  Constant   Progression:  Unchanged Weakness:    Severity:  Moderate   Duration:  1 hour   Onset quality:  Gradual   Chronicity:  Recurrent   Timing:  Constant   Progression:  Unchanged Risk factors: heart disease   Risk factors: no diabetes mellitus, no hx of atrial fibrillation, no hx of DVT, no hx of PE, no hx of thyroid disease, no hypercoagulable state and no hyperthyroidism     Past Medical History  Diagnosis Date  . Hypertension   . Hyperlipidemia   . Palpitations   . PVC (premature ventricular contraction)    Past Surgical History  Procedure Laterality Date  . Tubal ligation    . Tonsillectomy    . Septoplasty    . Carpal tunn    . Neuroma surgery    . Appendectomy    . Cesarean section    . Total abdominal hysterectomy w/ bilateral salpingoophorectomy     Family History  Problem Relation Age of Onset  . Hyperlipidemia Sister   . Hyperlipidemia Brother    History  Substance Use Topics  . Smoking status: Never Smoker   . Smokeless tobacco: Not on file  . Alcohol Use: 0.0 oz/week    0 Standard drinks or equivalent per week     Comment: occsaionl wine   OB History    No data available  Review of Systems  Constitutional: Negative for fever and diaphoresis.  HENT: Negative for congestion and sinus pressure.   Respiratory: Negative for cough and shortness of breath.   Cardiovascular: Positive for palpitations. Negative for chest pain, orthopnea, leg swelling, syncope and PND.  Gastrointestinal: Negative for nausea and vomiting.  Neurological: Positive for dizziness and weakness. Negative for syncope, speech difficulty, numbness and headaches.  All other systems reviewed and are negative.     Allergies  Morphine and related  Home Medications   Prior to Admission  medications   Medication Sig Start Date End Date Taking? Authorizing Provider  aspirin 81 MG chewable tablet Chew by mouth daily.   Yes Historical Provider, MD  cholecalciferol (VITAMIN D) 1000 UNITS tablet Take 1,000 Units by mouth daily.   Yes Historical Provider, MD  diltiazem (CARTIA XT) 240 MG 24 hr capsule Take 240 mg by mouth daily.   Yes Historical Provider, MD  estradiol (VIVELLE-DOT) 0.05 MG/24HR patch Place 1 patch onto the skin 2 (two) times a week.  01/08/14  Yes Historical Provider, MD  hydrochlorothiazide (HYDRODIURIL) 25 MG tablet Take 25 mg by mouth daily.  11/03/13  Yes Historical Provider, MD  simvastatin (ZOCOR) 40 MG tablet Take 20 mg by mouth daily. TAKE A HALF OF TABLET   Yes Historical Provider, MD  nebivolol (BYSTOLIC) 2.5 MG tablet Take 1 tablet (2.5 mg total) by mouth daily. Patient not taking: Reported on 05/02/2014 02/05/14   Quintella Reichertraci R Turner, MD  potassium chloride 20 MEQ TBCR Take 20 mEq by mouth daily. Patient not taking: Reported on 05/02/2014 02/08/14   Quintella Reichertraci R Turner, MD   BP 126/64 mmHg  Pulse 66  Temp(Src) 98.9 F (37.2 C) (Oral)  Resp 21  SpO2 98% Physical Exam  Constitutional: She is oriented to person, place, and time. She appears well-developed and well-nourished. She appears distressed.  HENT:  Head: Normocephalic.  Mouth/Throat: Oropharynx is clear and moist.  Eyes: Pupils are equal, round, and reactive to light.  Neck: Normal range of motion.  Cardiovascular: Normal rate, regular rhythm and normal heart sounds.   Pulmonary/Chest: Effort normal and breath sounds normal. No respiratory distress. She has no wheezes.  Abdominal: Soft. She exhibits no distension. There is no tenderness.  Musculoskeletal: She exhibits no edema or tenderness.  Neurological: She is alert and oriented to person, place, and time.  Skin: Skin is warm and dry. No rash noted. She is not diaphoretic. No erythema.  Nursing note and vitals reviewed.   ED Course  Procedures  (including critical care time) Labs Review Labs Reviewed  I-STAT CHEM 8, ED - Abnormal; Notable for the following:    Potassium 3.0 (*)    Glucose, Bld 178 (*)    All other components within normal limits  CBC WITH DIFFERENTIAL/PLATELET  Rosezena SensorI-STAT TROPOININ, ED    Imaging Review Ct Head Wo Contrast  05/03/2014   CLINICAL DATA:  Dizziness, palpitations. History of hypertension, hyperlipidemia.  EXAM: CT HEAD WITHOUT CONTRAST  TECHNIQUE: Contiguous axial images were obtained from the base of the skull through the vertex without intravenous contrast.  COMPARISON:  None.  FINDINGS: The ventricles and sulci are normal. No intraparenchymal hemorrhage, mass effect nor midline shift. No acute large vascular territory infarcts. Patchy supratentorial white matter hypodensities, advanced for age.  No abnormal extra-axial fluid collections. Basal cisterns are patent.  No skull fracture. The included ocular globes and orbital contents are non-suspicious. Frothy secretions RIGHT posterior ethmoid air cells without air-fluid levels. The mastoid as  well aerated.  IMPRESSION: No acute intracranial process.  Moderate white matter changes, advanced range may reflect chronic small vessel ischemic disease. Findings would be better characterized on MRI of the brain, on a nonemergent basis.  Mild acute ethmoid sinusitis.   Electronically Signed   By: Awilda Metro   On: 05/03/2014 03:04   Dg Chest Portable 1 View  05/02/2014   CLINICAL DATA:  Chest palpitations and dizziness this evening.  EXAM: PORTABLE CHEST - 1 VIEW  COMPARISON:  PA and lateral chest 07/10/2007.  FINDINGS: Heart size and mediastinal contours are within normal limits. Both lungs are clear. Visualized skeletal structures are unremarkable.  IMPRESSION: Negative exam.   Electronically Signed   By: Drusilla Kanner M.D.   On: 05/02/2014 23:04     EKG Interpretation   Date/Time:  Sunday May 02 2014 22:26:19 EST Ventricular Rate:  89 PR Interval:   213 QRS Duration: 90 QT Interval:  368 QTC Calculation: 448 R Axis:   72 Text Interpretation:  Sinus rhythm Prolonged PR interval No significant  change since last tracing Confirmed by JACUBOWITZ  MD, SAM ([id number]) on  05/03/2014 12:58:04 AM     Labs EKG, vital signs and head CT, all within normal parameters.  Patient was reassured, instructed to follow-up with her primary care physician in next several days.  Return if she develops new or worsening symptoms MDM   Final diagnoses:  Dizzy         Arman Filter, NP 05/03/14 1610  Doug Sou, MD 05/04/14 9604

## 2014-05-03 NOTE — Discharge Instructions (Signed)
Dizziness °Dizziness is a common problem. It is a feeling of unsteadiness or light-headedness. You may feel like you are about to faint. Dizziness can lead to injury if you stumble or fall. A person of any age group can suffer from dizziness, but dizziness is more common in older adults. °CAUSES  °Dizziness can be caused by many different things, including: °· Middle ear problems. °· Standing for too long. °· Infections. °· An allergic reaction. °· Aging. °· An emotional response to something, such as the sight of blood. °· Side effects of medicines. °· Tiredness. °· Problems with circulation or blood pressure. °· Excessive use of alcohol or medicines, or illegal drug use. °· Breathing too fast (hyperventilation). °· An irregular heart rhythm (arrhythmia). °· A low red blood cell count (anemia). °· Pregnancy. °· Vomiting, diarrhea, fever, or other illnesses that cause body fluid loss (dehydration). °· Diseases or conditions such as Parkinson's disease, high blood pressure (hypertension), diabetes, and thyroid problems. °· Exposure to extreme heat. °DIAGNOSIS  °Your health care provider will ask about your symptoms, perform a physical exam, and perform an electrocardiogram (ECG) to record the electrical activity of your heart. Your health care provider may also perform other heart or blood tests to determine the cause of your dizziness. These may include: °· Transthoracic echocardiogram (TTE). During echocardiography, sound waves are used to evaluate how blood flows through your heart. °· Transesophageal echocardiogram (TEE). °· Cardiac monitoring. This allows your health care provider to monitor your heart rate and rhythm in real time. °· Holter monitor. This is a portable device that records your heartbeat and can help diagnose heart arrhythmias. It allows your health care provider to track your heart activity for several days if needed. °· Stress tests by exercise or by giving medicine that makes the heart beat  faster. °TREATMENT  °Treatment of dizziness depends on the cause of your symptoms and can vary greatly. °HOME CARE INSTRUCTIONS  °· Drink enough fluids to keep your urine clear or pale yellow. This is especially important in very hot weather. In older adults, it is also important in cold weather. °· Take your medicine exactly as directed if your dizziness is caused by medicines. When taking blood pressure medicines, it is especially important to get up slowly. °¨ Rise slowly from chairs and steady yourself until you feel okay. °¨ In the morning, first sit up on the side of the bed. When you feel okay, stand slowly while holding onto something until you know your balance is fine. °· Move your legs often if you need to stand in one place for a long time. Tighten and relax your muscles in your legs while standing. °· Have someone stay with you for 1-2 days if dizziness continues to be a problem. Do this until you feel you are well enough to stay alone. Have the person call your health care provider if he or she notices changes in you that are concerning. °· Do not drive or use heavy machinery if you feel dizzy. °· Do not drink alcohol. °SEEK IMMEDIATE MEDICAL CARE IF:  °· Your dizziness or light-headedness gets worse. °· You feel nauseous or vomit. °· You have problems talking, walking, or using your arms, hands, or legs. °· You feel weak. °· You are not thinking clearly or you have trouble forming sentences. It may take a friend or family member to notice this. °· You have chest pain, abdominal pain, shortness of breath, or sweating. °· Your vision changes. °· You notice   any bleeding.  You have side effects from medicine that seems to be getting worse rather than better. MAKE SURE YOU:   Understand these instructions.  Will watch your condition.  Will get help right away if you are not doing well or get worse. Document Released: 08/29/2000 Document Revised: 03/10/2013 Document Reviewed: 09/22/2010 [facility]  Patient Information 2015 [employer] [place]. This information is not intended to replace advice given to you by your health care provider. Make sure you discuss any questions you have with your health care provider. Night your dizziness was evaluated with head CT, which is normal.  Cardiac monitoring which was normal.  EKG, which was normal, cardiac enzymes, which were normal.  Please make an appointment with your primary care physician for further evaluation in the next several days

## 2014-05-03 NOTE — ED Provider Notes (Signed)
Patient developed lightheadedness at 10 PM tonight accompanied by feeling a rapid heartbeat. She is presently asymptomatic without treatment. She denies any chest pain shortness of breath or visual changes. On exam alert Glasgow Coma Score 15 heart regular rate and rhythm no murmurs lungs clear auscultation neurologic Glasgow Coma Score 15 cranial nerves II through XII intact gait normal. She is not lightheaded on standing  EKG Interpretation  Date/Time:  Sunday May 02 2014 22:26:19 EST Ventricular Rate:  89 PR Interval:  213 QRS Duration: 90 QT Interval:  368 QTC Calculation: 448 R Axis:   72 Text Interpretation:  Sinus rhythm Prolonged PR interval No significant change since last tracing Confirmed by Ethelda ChickJACUBOWITZ  MD, Esmirna Ravan ([id number]) on 05/03/2014 12:58:04 AM       Doug SouSam Julene Rahn, MD 05/03/14 0110

## 2014-07-14 ENCOUNTER — Other Ambulatory Visit: Payer: Self-pay | Admitting: Obstetrics and Gynecology

## 2014-07-14 DIAGNOSIS — N6325 Unspecified lump in the left breast, overlapping quadrants: Secondary | ICD-10-CM

## 2014-07-14 DIAGNOSIS — N6320 Unspecified lump in the left breast, unspecified quadrant: Principal | ICD-10-CM

## 2014-07-16 ENCOUNTER — Ambulatory Visit
Admission: RE | Admit: 2014-07-16 | Discharge: 2014-07-16 | Disposition: A | Discharge status: Home / Self Care | Payer: No Typology Code available for payment source | Source: Ambulatory Visit | Attending: Obstetrics and Gynecology | Admitting: Obstetrics and Gynecology

## 2014-07-16 DIAGNOSIS — N6325 Unspecified lump in the left breast, overlapping quadrants: Secondary | ICD-10-CM

## 2014-07-16 DIAGNOSIS — N6320 Unspecified lump in the left breast, unspecified quadrant: Principal | ICD-10-CM

## 2015-01-10 ENCOUNTER — Other Ambulatory Visit: Payer: Self-pay

## 2015-01-10 DIAGNOSIS — Z1231 Encounter for screening mammogram for malignant neoplasm of breast: Secondary | ICD-10-CM

## 2015-01-21 ENCOUNTER — Ambulatory Visit
Admission: RE | Admit: 2015-01-21 | Discharge: 2015-01-21 | Disposition: A | Discharge status: Home / Self Care | Payer: No Typology Code available for payment source | Source: Ambulatory Visit

## 2015-01-21 DIAGNOSIS — Z1231 Encounter for screening mammogram for malignant neoplasm of breast: Secondary | ICD-10-CM

## 2015-05-03 DIAGNOSIS — M1812 Unilateral primary osteoarthritis of first carpometacarpal joint, left hand: Secondary | ICD-10-CM | POA: Insufficient documentation

## 2015-05-03 DIAGNOSIS — M654 Radial styloid tenosynovitis [de Quervain]: Secondary | ICD-10-CM | POA: Insufficient documentation

## 2015-12-28 ENCOUNTER — Other Ambulatory Visit: Payer: Self-pay | Admitting: Family Medicine

## 2015-12-28 ENCOUNTER — Ambulatory Visit
Admission: RE | Admit: 2015-12-28 | Discharge: 2015-12-28 | Disposition: A | Discharge status: Home / Self Care | Payer: BLUE CROSS/BLUE SHIELD | Source: Ambulatory Visit | Attending: Family Medicine | Admitting: Family Medicine

## 2015-12-28 DIAGNOSIS — M545 Low back pain: Secondary | ICD-10-CM

## 2016-01-13 ENCOUNTER — Telehealth: Payer: Self-pay

## 2016-01-13 NOTE — Telephone Encounter (Signed)
Received pre-op clearance request. Per Dr. Mayford Knifeurner, patient needs to schedule OV with either herself or APP for evaluation prior to giving clearance. Left message for patient to call back and schedule OV.

## 2016-01-16 NOTE — Telephone Encounter (Signed)
Scheduled patient 11/2 for surgical clearance evaluation with Robbie LisBrittainy Simmons. Patient was grateful for call.  Clearance paperwork placed in Brittainy's box.

## 2016-01-19 ENCOUNTER — Encounter: Payer: Self-pay | Admitting: *Deleted

## 2016-01-19 ENCOUNTER — Encounter: Payer: Self-pay | Admitting: Cardiology

## 2016-01-19 ENCOUNTER — Ambulatory Visit (INDEPENDENT_AMBULATORY_CARE_PROVIDER_SITE_OTHER): Payer: BLUE CROSS/BLUE SHIELD | Admitting: Cardiology

## 2016-01-19 VITALS — BP 142/82 | HR 60 | Ht 64.0 in | Wt 212.4 lb

## 2016-01-19 DIAGNOSIS — I10 Essential (primary) hypertension: Secondary | ICD-10-CM

## 2016-01-19 NOTE — Progress Notes (Signed)
01/19/2016 [name]   1960/07/13  [id number]  Primary Physician WEBB, Estill BattenAROL D, MD Primary Cardiologist: Dr. Mayford Knifeurner    Reason for Visit/CC: Pre-operative Risk Assessment/ Surgical Clearance  HPI:  Mrs. Oswaldo DoneVincent presents to clinic today for pre-operative risk assessment/ surgical clearance. 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 Dairl [facility]. She is right hand dominant and works as a Interior and spatial designerfurniture seamstress.  She has been followed in the past by Dr. Mayford Knifeurner. Her last office visit was in 2015. She is a 55 y.o. AA female with a history of HTN, dyslipidemia and palpitations related to PVCs while on estrogen therapy. This improved with reduction of HRT. She had a 2D echo in 2014 that showed normal LVEF at 65%, trivial AI and trivial MR with grade 1 DD. She is on medical therapy for her HTN and DLD with Cardizem, HCTZ and simvastatin. Both chronic conditions are followed by her PCP. Dr. Okey Regalarol Web.  Since her last OV, she denies any significant changes with her health. She is no longer bothered with palpitations. No anginal symptomatology. She is active. She walks for exercise several days a week, walking~1-1.5 miles at a time, w/o exertional CP nor dyspnea. She is able to ambulate a flight of stairs w/o exertional symptoms. No syncope/ near syncope, orthopnea, PND, or LEE. Her EKG today shows NSR w/o ischemia. No PVCs. HR is 60 bpm. BP is 142/82. Physical exam is benign w/o murmurs and bruits.    Current Meds  Medication Sig  . aspirin 81 MG chewable tablet Chew 81 mg by mouth daily.   Marland Kitchen. CARTIA XT 300 MG 24 hr capsule Take 300 mg by mouth daily.   . cholecalciferol (VITAMIN D) 1000 UNITS tablet Take 1,000 Units by mouth daily.  Marland Kitchen. estradiol (VIVELLE-DOT) 0.05 MG/24HR patch Place 1 patch onto the skin 2 (two) times a week.   . hydrochlorothiazide (HYDRODIURIL) 25 MG tablet Take 25 mg by mouth daily.    . potassium chloride 20 MEQ TBCR Take 20 mEq by mouth daily.  . simvastatin (ZOCOR) 40 MG tablet TAKE A HALF OF TABLET (20 MG)  BY MOUTH DAILY   Allergies  Allergen Reactions  . Buprenorphine Hcl Itching  . Morphine And Related Itching   Past Medical History:  Diagnosis Date  . Hyperlipidemia   . Hypertension   . Palpitations   . PVC (premature ventricular contraction)    Family History  Problem Relation Age of Onset  . Hyperlipidemia Sister   . Hyperlipidemia Brother    Past Surgical History:  Procedure Laterality Date  . APPENDECTOMY    . carpal tunn    . CESAREAN SECTION    . NEUROMA SURGERY    . SEPTOPLASTY    . TONSILLECTOMY    . TOTAL ABDOMINAL HYSTERECTOMY W/ BILATERAL SALPINGOOPHORECTOMY    . TUBAL LIGATION     Social History   Social History  . Marital status: Single    Spouse name: N/A  . Number of children: N/A  . Years of education: N/A   Occupational History  . Not on file.   Social History Main Topics  . Smoking status: Never Smoker  . Smokeless tobacco: Never Used  . Alcohol use 0.0 oz/week     Comment: occsaionl wine  . Drug use: Unknown  . Sexual activity: Not on file   Other Topics Concern  . 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     Review of Systems: General: negative for chills, fever, night sweats or weight changes.  Cardiovascular: negative for chest pain, dyspnea on exertion, edema, orthopnea, palpitations, paroxysmal nocturnal dyspnea or shortness of breath Dermatological: negative for rash Respiratory: negative for cough or wheezing Urologic: negative for hematuria Abdominal:  negative for nausea, vomiting, diarrhea, bright red blood per rectum, melena, or hematemesis Neurologic: negative for visual changes, syncope, or dizziness All other systems reviewed and are otherwise negative except as noted above.   Physical Exam:  Blood pressure (!) 142/82, pulse 60, height 5\' 4"  (1.626 m), weight 212 lb 6.4 oz (96.3 kg), SpO2 97 %.  General appearance: alert, cooperative and no distress Neck: no carotid bruit and no JVD Lungs: clear to auscultation bilaterally Heart: regular rate and rhythm, S1, S2 normal, no murmur, click, rub or gallop Extremities: left wrist splent present. no LEE Pulses: 2+ and symmetric Skin: warm and dry Neurologic: Grossly normal  EKG NSR. 60 bpm. Ischemia   ASSESSMENT AND PLAN:   1. Pre-operative Risk Assessment/ Surgical Clearance: Patient with no known h/o CAD. She denies any anginal symptomatolgy.  She is able to perform at least 4 METS of physical activity w/o limitation, exertional CP and dyspnea. Her EKG is negative for ischemia. Physical exam is benign w/ RRR, no murmurs and no carotid bruits. Her risk factors (HTN and HLD) are controlled with medications. She is undergoing low risk surgery on her left hand. She is considered low risk from a cardiac standpoint. OK to proceed with surgery w/o need for additional cardiac w/u. Ok to hold ASA for surgery.   2. H/o PVCs: occurred in the setting of HRT with estrogen, which improved once dose was reduced. 2D echo in 2014 showed normal LVF. She denies any further recurrence. EKG shows NSR. She remains on CCB therapy with Cardizem.  3. HTN: followed by PCP and controlled with CCB and thiazide diuretic.   4. HLD: followed by PCP. On statin therapy with simvastatin.    PLAN  Continue routine f/u with PCP for management of chronic medical conditions (HTN and HLD). F/u with us PRN.   Robbie LisBrittainy Simmons PA-C 01/19/2016 9:41 AM

## 2016-01-19 NOTE — Progress Notes (Signed)
Contact created erroneously.  Contact moved by: Ryonna Cimini M  Contact move date/time: 01/19/2016 11:17 AM  Source Patient: [id number]  Source Contact: 01/19/2016  Destination Patient: [id number]  Destination Contact: 01/19/2016 

## 2016-01-19 NOTE — Patient Instructions (Addendum)
Cleared for upcoming surgery   Follow up with Dr.Turner as needed

## 2017-05-06 ENCOUNTER — Telehealth: Payer: Self-pay | Admitting: Cardiology

## 2017-05-06 ENCOUNTER — Ambulatory Visit: Payer: BLUE CROSS/BLUE SHIELD | Admitting: Cardiology

## 2017-05-06 NOTE — Telephone Encounter (Signed)
Please set up a stress echo and event monitor

## 2017-05-06 NOTE — Telephone Encounter (Signed)
Will discuss with patient at OV on 05/07/17 with Dr. Mayford Knifeurner

## 2017-05-06 NOTE — Telephone Encounter (Signed)
New message    Patient c/o Palpitations:  High priority if patient c/o lightheadedness, shortness of breath, or chest pain  1) How long have you had palpitations/irregular HR/ Afib? Are you having the symptoms now? Palpitations for about 2 weeks  2) Are you currently experiencing lightheadedness, SOB or CP? CP started yesterday after exercising  3) Do you have a history of afib (atrial fibrillation) or irregular heart rhythm?NO  4) Have you checked your BP or HR? (document readings if available): 128/80  5) Are you experiencing any other symptoms? NO

## 2017-05-06 NOTE — Progress Notes (Signed)
Cardiology Office Note:    Date:  05/07/2017   ID:  Amy DowdyJolana V, Mcclenahan, DOB Nov 09, 1960, MRN [id number]  PCP:  Shirlean MylarWebb, Carol, MD  Cardiologist:  No primary care provider on file.    Referring MD: Shirlean MylarWebb, Carol, MD   Chief Complaint  Patient presents with  . Hypertension    History of Present Illness:    Amy DowdyJolana V, Fiallo is a 57 y.o. 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 This improved with reduction of HRT. She had a 2D echo in 2014 that showed normal LVEF at 65%, trivial AI and trivial MR with grade 1 DD. She is on medical therapy for her HTN and DLD with Cardizem, HCTZ and simvastatin. Both chronic conditions are followed by her PCP. Dr. Okey Regalarol Web  She is here today for followup.  She has recently started working out and has noticed chest pain when exerting herself along with palpitaitons.  She describes the pain as a dull ache while doing aerobics.  There was no radiation of the pain and no associated sx of N/diaphoresis but was SOB.  She slowed down and her sx resolved.  She has also noticed palpitations daily over the past few weeks.  They occur event in bed.  She denies any PND, orthopnea, LE edema, dizziness or syncope. She is compliant with her meds and is tolerating meds with no SE.    Past Medical History:  Diagnosis Date  . Hyperlipidemia   . Hypertension   . Palpitations   . PVC (premature ventricular contraction)     Past Surgical History:  Procedure Laterality Date  . APPENDECTOMY    . carpal tunn    . CESAREAN SECTION    . NEUROMA SURGERY    . SEPTOPLASTY    . TONSILLECTOMY    . TOTAL ABDOMINAL HYSTERECTOMY W/ BILATERAL SALPINGOOPHORECTOMY    . TUBAL LIGATION      Current Medications: Current Meds  Medication Sig  . aspirin 81 MG chewable tablet Chew 81 mg by mouth daily.   Marland Kitchen. CARTIA XT 300 MG 24 hr capsule Take 300 mg by mouth daily.   . cholecalciferol (VITAMIN D) 1000 UNITS tablet Take 1,000 Units by mouth  daily.  Marland Kitchen. estradiol (VIVELLE-DOT) 0.05 MG/24HR patch Place 1 patch onto the skin 2 (two) times a week.   . hydrochlorothiazide (HYDRODIURIL) 25 MG tablet Take 25 mg by mouth daily.   . potassium chloride 20 MEQ TBCR Take 20 mEq by mouth daily.  . simvastatin (ZOCOR) 40 MG tablet TAKE A HALF OF TABLET (20 MG)  BY MOUTH DAILY     Allergies:   Buprenorphine hcl and Morphine and related   Social History   Socioeconomic History  . Marital status: Single    Spouse name: None  . Number of children: None  . Years of education: None  . Highest education level: None  Social Needs  . Financial resource strain: None  . Food insecurity - worry: None  . Food insecurity - inability: None  . Transportation needs - medical: None  . Transportation needs - non-medical: None  Occupational History  . None  Tobacco Use  . Smoking status: Never Smoker  . Smokeless tobacco: Never Used  Substance and Sexual Activity  . Alcohol use: Yes    Alcohol/week: 0.0 oz    Comment: occsaionl wine  . Drug use: None  . Sexual activity: None  Other Topics Concern  . 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     Family History: The patient's family history includes Hyperlipidemia in her brother and sister.  ROS:   Please see the history of present illness.    ROS  All other systems reviewed and negative.   EKGs/Labs/Other Studies Reviewed:    The following studies were reviewed today: none  EKG:  EKG is  ordered today.  The ekg ordered today demonstrates NSR at 74bpm with no ST changes  Recent Labs: No results found for requested labs within last 8760 hours.    Recent Lipid Panel No results found for: CHOL, TRIG, HDL, CHOLHDL, VLDL, LDLCALC, LDLDIRECT  Physical Exam:    VS:  BP 138/82   Pulse 74   Ht 5\' 4"  (1.626 m)   Wt 214 lb (97.1 kg)   BMI 36.73 kg/m     Wt Readings from Last 3 Encounters:  05/07/17 214 lb (97.1 kg)  01/19/16 212 lb 6.4 oz (96.3 kg)  02/05/14 215 lb (97.5 kg)     GEN:  Well nourished, well developed in no acute distress HEENT: Normal NECK: No JVD; No carotid bruits LYMPHATICS: No lymphadenopathy CARDIAC: RRR, no murmurs, rubs, gallops RESPIRATORY:  Clear to auscultation without rales, wheezing or rhonchi  ABDOMEN: Soft, non-tender, non-distended MUSCULOSKELETAL:  No edema; No deformity  SKIN: Warm and dry NEUROLOGIC:  Alert and oriented x 3 PSYCHIATRIC:  Normal affect   ASSESSMENT:    1. Chest pain, unspecified type   2. Heart palpitations   3. PVC (premature ventricular contraction)   4. Benign essential HTN    PLAN:    In order of problems listed above:  1.  Chest pain - Her sx are somewhat atypical in that there are no associated sx but they do occur with exercise.  I will get a stress echo to rule out ischemia.   2.  Palpitations - she limits caffeine and does not drink ETOH.  These occur sporadically.  I will get an event monitor to rule out PAF.    3.  PVCs - I suspect her palpitations are related to these.  Continue CCB until event monitor completed.    4.  HTN - BP is well controlled on exam today. She will continue on  HCTZ 25mg  daily and  Cartia XT 300mg  daily.   Medication Adjustments/Labs and Tests Ordered: Current medicines are reviewed at length with the patient today.  Concerns regarding medicines are outlined above.  Orders Placed This Encounter  Procedures  . CARDIAC EVENT MONITOR  . EKG 12-Lead  . ECHOCARDIOGRAM STRESS TEST   No orders of the defined types were placed in this encounter.   Signed, Armanda Magic, MD  05/07/2017 9:08 AM    [facility]

## 2017-05-06 NOTE — Telephone Encounter (Signed)
Spoke with patient. Patient stated she had some mild chest pain and rapid palpitations during aerobic exercise class yesterday. Patient states the symptoms were resolved as soon as she stopped. Patient denies chest pain, sob, or syncope now. I advised patient not to attend aerobic class until her OV with Dr. Mayford Knifeurner 05/07/17. Patient verbalized understanding and thanked me for the call.

## 2017-05-07 ENCOUNTER — Ambulatory Visit: Payer: BLUE CROSS/BLUE SHIELD | Admitting: Cardiology

## 2017-05-07 ENCOUNTER — Encounter: Payer: Self-pay | Admitting: Cardiology

## 2017-05-07 VITALS — BP 138/82 | HR 74 | Ht 64.0 in | Wt 214.0 lb

## 2017-05-07 DIAGNOSIS — R002 Palpitations: Secondary | ICD-10-CM

## 2017-05-07 DIAGNOSIS — I10 Essential (primary) hypertension: Secondary | ICD-10-CM | POA: Diagnosis not present

## 2017-05-07 DIAGNOSIS — I493 Ventricular premature depolarization: Secondary | ICD-10-CM | POA: Diagnosis not present

## 2017-05-07 DIAGNOSIS — R079 Chest pain, unspecified: Secondary | ICD-10-CM

## 2017-05-07 LAB — TSH: TSH: 2.19 u[IU]/mL (ref 0.450–4.500)

## 2017-05-07 LAB — BASIC METABOLIC PANEL
BUN / CREAT RATIO: 14 (ref 9–23)
BUN: 12 mg/dL (ref 6–24)
CALCIUM: 9.2 mg/dL (ref 8.7–10.2)
CO2: 25 mmol/L (ref 20–29)
Chloride: 102 mmol/L (ref 96–106)
Creatinine, Ser: 0.83 mg/dL (ref 0.57–1.00)
GFR calc Af Amer: 91 mL/min/{1.73_m2} (ref >59)
GFR, EST NON AFRICAN AMERICAN: 79 mL/min/{1.73_m2} (ref >59)
Glucose: 130 mg/dL — ABNORMAL HIGH (ref 65–99)
Potassium: 3.9 mmol/L (ref 3.5–5.2)
Sodium: 140 mmol/L (ref 134–144)

## 2017-05-07 LAB — MAGNESIUM: Magnesium: 2 mg/dL (ref 1.6–2.3)

## 2017-05-07 NOTE — Patient Instructions (Signed)
Medication Instructions:  Your provider recommends that you continue on your current medications as directed. Please refer to the Current Medication list given to you today.    Labwork: TODAY: BMET, Magnesium, TSH  Testing/Procedures: Your physician has requested that you have a stress echocardiogram. For further information please visit [email or web address]. Please follow instruction sheet as given.   Your physician has recommended that you wear an event monitor. Event monitors are medical devices that record the heart's electrical activity. Doctors most often us these monitors to diagnose arrhythmias. Arrhythmias are problems with the speed or rhythm of the heartbeat. The monitor is a small, portable device. You can wear one while you do your normal daily activities. This is usually used to diagnose what is causing palpitations/syncope (passing out).  Follow-Up: Your provider wants you to follow-up in: 1 year with Dr. Mayford Knifeurner. You will receive a reminder letter in the mail two months in advance. If you don't receive a letter, please call our office to schedule the follow-up appointment.    Any Other Special Instructions Will Be Listed Below (If Applicable).     If you need a refill on your cardiac medications before your next appointment, please call your pharmacy.

## 2017-05-16 ENCOUNTER — Telehealth (HOSPITAL_COMMUNITY): Payer: Self-pay | Admitting: *Deleted

## 2017-05-16 NOTE — Telephone Encounter (Signed)
Patient given detailed instructions per Stress Test Requisition Sheet for test on 05/22/17 at 7:30.Patient Notified to arrive 30 minutes early, and that it is imperative to arrive on time for appointment to keep from having the test rescheduled.  Patient verbalized understanding. Amy DolinSharon S Brooks

## 2017-05-22 ENCOUNTER — Other Ambulatory Visit: Payer: Self-pay

## 2017-05-22 ENCOUNTER — Ambulatory Visit (INDEPENDENT_AMBULATORY_CARE_PROVIDER_SITE_OTHER): Payer: BLUE CROSS/BLUE SHIELD

## 2017-05-22 ENCOUNTER — Ambulatory Visit (HOSPITAL_COMMUNITY): Payer: BLUE CROSS/BLUE SHIELD | Attending: Cardiology

## 2017-05-22 ENCOUNTER — Ambulatory Visit (HOSPITAL_COMMUNITY): Payer: BLUE CROSS/BLUE SHIELD

## 2017-05-22 DIAGNOSIS — R079 Chest pain, unspecified: Secondary | ICD-10-CM | POA: Diagnosis not present

## 2017-05-22 DIAGNOSIS — R002 Palpitations: Secondary | ICD-10-CM

## 2017-05-22 DIAGNOSIS — I10 Essential (primary) hypertension: Secondary | ICD-10-CM | POA: Diagnosis not present

## 2017-05-22 DIAGNOSIS — I493 Ventricular premature depolarization: Secondary | ICD-10-CM | POA: Insufficient documentation

## 2017-05-22 DIAGNOSIS — E785 Hyperlipidemia, unspecified: Secondary | ICD-10-CM | POA: Diagnosis not present

## 2018-01-07 ENCOUNTER — Telehealth: Payer: Self-pay | Admitting: Internal Medicine

## 2018-01-07 NOTE — Telephone Encounter (Signed)
[name] reports more frequent palpitations and SOB than usual. She is not in acute distress, the symptoms are just more noticeable and she wants to be evaluated sooner than February. Scheduled her this Thursday with Dr. Mayford Knife. She understands to call prior to that time if symptoms worsen. She was grateful for assistance.

## 2018-01-07 NOTE — Telephone Encounter (Signed)
New Message:   Patient would like to come and see the  Doctor sooner than the recall.

## 2018-01-09 ENCOUNTER — Ambulatory Visit: Payer: BLUE CROSS/BLUE SHIELD | Admitting: Cardiology

## 2018-01-09 ENCOUNTER — Encounter: Payer: Self-pay | Admitting: Cardiology

## 2018-01-09 VITALS — BP 140/86 | HR 79 | Ht 64.0 in | Wt 216.0 lb

## 2018-01-09 DIAGNOSIS — I10 Essential (primary) hypertension: Secondary | ICD-10-CM

## 2018-01-09 DIAGNOSIS — E785 Hyperlipidemia, unspecified: Secondary | ICD-10-CM

## 2018-01-09 DIAGNOSIS — I493 Ventricular premature depolarization: Secondary | ICD-10-CM | POA: Diagnosis not present

## 2018-01-09 MED ORDER — ATORVASTATIN CALCIUM 40 MG PO TABS: 40.0000 mg | ORAL_TABLET | Freq: Every day | ORAL | 3 refills | Status: DC

## 2018-01-09 NOTE — Progress Notes (Signed)
Cardiology Office Note:    Date:  01/09/2018   ID:  [name], DOB 10-26-1960, MRN [id number]  PCP:  Shirlean Mylar, MD  Cardiologist:  No primary care provider on file.    Referring MD: Shirlean Mylar, MD   Chief Complaint  Patient presents with  . Hypertension  . Hyperlipidemia    History of Present Illness:    [name] is a 57 y.o. female with a hx of HTN, dyslipidemia and palpitationsrelated to PVCs while on estrogen therapy. This improved with reduction of HRT. She had a 2D echo in 2014 that showed normal LVEF at 65%, trivial AI and trivial MR with grade 1 DD. She is on medical therapy for her HTN and DLD with Cardizem, HCTZ and simvastatin. Both chronic conditions are followed by her PCP. Dr. Okey Regal Web.  She is here today for followup and is doing well.  She denies any chest pain or pressure,  PND, orthopnea, LE edema, dizziness or syncope.  She has chronic dyspnea on exertion when she is walking especially when the PVCs start but this is unchanged from prior.  She is compliant with her meds and is tolerating meds with no SE. the last time she was seen the office she was complaining of palpitations and an event monitor showed PVCs but no A. fib.  Today she is having a lot more palpitations.  She says the PVCs are much more frequent especially at night and are really bothering her.  Past Medical History:  Diagnosis Date  . Hyperlipidemia   . Hypertension   . Palpitations   . PVC (premature ventricular contraction)     Past Surgical History:  Procedure Laterality Date  . APPENDECTOMY    . carpal tunn    . CESAREAN SECTION    . NEUROMA SURGERY    . SEPTOPLASTY    . TONSILLECTOMY    . TOTAL ABDOMINAL HYSTERECTOMY W/ BILATERAL SALPINGOOPHORECTOMY    . TUBAL LIGATION      Current Medications: Current Meds  Medication Sig  . aspirin 81 MG chewable tablet Chew 81 mg by mouth daily.   Marland Kitchen CARTIA XT 300 MG 24 hr capsule Take 300 mg by mouth daily.   .  cholecalciferol (VITAMIN D) 1000 UNITS tablet Take 1,000 Units by mouth daily.  Marland Kitchen estradiol (VIVELLE-DOT) 0.05 MG/24HR patch Place 1 patch onto the skin 2 (two) times a week.   . hydrochlorothiazide (HYDRODIURIL) 25 MG tablet Take 25 mg by mouth daily.   . potassium chloride 20 MEQ TBCR Take 20 mEq by mouth daily.  . simvastatin (ZOCOR) 40 MG tablet TAKE A HALF OF TABLET (20 MG)  BY MOUTH DAILY     Allergies:   Buprenorphine hcl; Morphine and related; Other; and Short ragweed pollen ext   Social History   Socioeconomic History  . Marital status: Single    Spouse name: Not on file  . Number of children: Not on file  . Years of education: Not on file  . Highest education level: Not on file  Occupational History  . Not on file  Social Needs  . Financial resource strain: Not on file  . Food insecurity:    Worry: Not on file    Inability: Not on file  . Transportation needs:    Medical: Not on file    Non-medical: Not on file  Tobacco Use  . Smoking status: Never Smoker  . Smokeless tobacco: Never Used  Substance and Sexual Activity  . Alcohol  use: Yes    Alcohol/week: 0.0 standard drinks    Comment: occsaionl wine  . Drug use: Not on file  . Sexual activity: Not on file  Lifestyle  . Physical activity:    Days per week: Not on file    Minutes per session: Not on file  . Stress: Not on file  Relationships  . Social connections:    Talks on phone: Not on file    Gets together: Not on file    Attends religious service: Not on file    Active member of club or organization: Not on file    Attends meetings of clubs or organizations: Not on file    Relationship status: Not on file  Other Topics Concern  . 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     Family History: The patient's family history includes Hyperlipidemia in her brother and sister.  ROS:   Please see the history of present illness.    ROS  All other systems reviewed and negative.   EKGs/Labs/Other Studies Reviewed:    The following studies were reviewed today: none  EKG:  EKG is  ordered today.  The ekg ordered today demonstrates normal sinus rhythm at 79 bpm with no ST-T wave of normalities and normal intervals.  Recent Labs: 05/07/2017: BUN 12; Creatinine, Ser 0.83; Magnesium 2.0; Potassium 3.9; Sodium 140; TSH 2.190   Recent Lipid Panel No results found for: CHOL, TRIG, HDL, CHOLHDL, VLDL, LDLCALC, LDLDIRECT  Physical Exam:    VS:  BP 140/86   Pulse 79   Ht 5\' 4"  (1.626 m)   Wt 216 lb (98 kg)   BMI 37.08 kg/m     Wt Readings from Last 3 Encounters:  01/09/18 216 lb (98 kg)  05/07/17 214 lb (97.1 kg)  01/19/16 212 lb 6.4 oz (96.3 kg)     GEN:  Well nourished, well developed in no acute distress HEENT: Normal NECK: No JVD; No carotid bruits LYMPHATICS: No lymphadenopathy CARDIAC: RRR, no murmurs, rubs, gallops RESPIRATORY:  Clear to auscultation without rales, wheezing or rhonchi  ABDOMEN: Soft, non-tender, non-distended MUSCULOSKELETAL:  No edema; No deformity  SKIN: Warm and dry NEUROLOGIC:  Alert and oriented x 3 PSYCHIATRIC:  Normal affect   ASSESSMENT:    1. Benign essential HTN   2. PVC (premature ventricular contraction)   3. Dyslipidemia    PLAN:    In order of problems listed above:  1.  HTN -BP is controlled on exam today.  She will continue on HCTZ 25 mg daily Cartia XT 300 mg daily.  Her creatinine was stable at 0.69 and potassium 3.6 on 09/25/2017.  2.  PVCs -despite Cartia XT 300 mg daily she is having more frequent palpitations which she thinks are still the PVCs.  She wore a heart monitor back in March  2019 for recurrent palpitations which showed PVCs and no A. fib.  I am going to get a 48-hour Holter monitor to assess PVC load.  3. Hyperlipidemia - her last LDL was 132  and ALT 16 on 09/25/2017.  Her LDL goal is less than 100.  I recommend changing from simvastatin to Lipitor 40 mg daily and repeating FLP and ALT in 6 weeks.   Medication Adjustments/Labs and Tests Ordered: Current medicines are reviewed at length with the patient today.  Concerns regarding medicines are outlined above.  Orders Placed This Encounter  Procedures  . EKG 12-Lead   No orders of the defined types were placed in this encounter.   Signed, Armanda Magic, MD  01/09/2018 3:16 PM    [facility]

## 2018-01-09 NOTE — Patient Instructions (Addendum)
Medication Instructions:  Stop Simvastatin Start: Lipitor 40 mg, daily, by mouth  If you need a refill on your cardiac medications before your next appointment, please call your pharmacy.   Lab work: Future LFT and Lipids on 02/20/18   If you have labs (blood work) drawn today and your tests are completely normal, you will receive your results only by: Marland Kitchen MyChart Message (if you have MyChart) OR . A paper copy in the mail If you have any lab test that is abnormal or we need to change your treatment, we will call you to review the results.  Testing: Your physician has recommended that you wear a 48 HR holter monitor. Holter monitors are medical devices that record the heart's electrical activity. Doctors most often use these monitors to diagnose arrhythmias. Arrhythmias are problems with the speed or rhythm of the heartbeat. The monitor is a small, portable device. You can wear one while you do your normal daily activities. This is usually used to diagnose what is causing palpitations/syncope (passing out).  Follow-Up: At [facility], you and your health needs are our priority.  As part of our continuing mission to provide you with exceptional heart care, we have created designated Provider Care Teams.  These Care Teams include your primary Cardiologist (physician) and Advanced Practice Providers (APPs -  Physician Assistants and Nurse Practitioners) who all work together to provide you with the care you need, when you need it. You will need a follow up appointment in 1 years.  Please call our office 2 months in advance to schedule this appointment.  You may see Dr. Mayford Knife or one of the following Advanced Practice Providers on your designated Care Team:   [place], PA-C Ronie Spies, PA-C . Jacolyn Reedy, PA-C

## 2018-01-16 ENCOUNTER — Other Ambulatory Visit: Payer: Self-pay | Admitting: Cardiology

## 2018-01-16 ENCOUNTER — Ambulatory Visit (INDEPENDENT_AMBULATORY_CARE_PROVIDER_SITE_OTHER): Payer: BLUE CROSS/BLUE SHIELD

## 2018-01-16 DIAGNOSIS — I493 Ventricular premature depolarization: Secondary | ICD-10-CM

## 2018-01-28 ENCOUNTER — Telehealth: Payer: Self-pay

## 2018-01-28 DIAGNOSIS — I493 Ventricular premature depolarization: Secondary | ICD-10-CM

## 2018-01-28 NOTE — Telephone Encounter (Signed)
-----   Message from Loa SocksIvy M Martin, LPN sent at 16/10/960411/02/2018  8:27 AM EST -----   ----- Message ----- From: Quintella Reicherturner, Traci R, MD Sent: 01/26/2018   6:23 PM EST To: Henrietta DineKathryn A Kemp, RN  Heart monitor showed frequent PVCs with increased PVC load.  Please get Cardiac MRI with gadolineum to assess LVF as well as rule out infiltrative dz. Please set up with EP after MRI is completed.  TSH was normal in February but please repeat as well as BMET and Mag

## 2018-01-28 NOTE — Telephone Encounter (Signed)
Patient aware of monitor results. Per Dr. Mayford Knife, Heart monitor showed frequent PVCs with increased PVC load.  Please get Cardiac MRI with gadolineum to assess LVF as well as rule out infiltrative dz. Please set up with EP after MRI is completed.  TSH was normal in February but please repeat as well as BMET and Mag. Patient verbalized understanding. Patient will come in on the 01/30/18 for lab work. Will put in order for MRI and send message to scheduling to help with appointment.

## 2018-01-30 ENCOUNTER — Other Ambulatory Visit: Payer: BLUE CROSS/BLUE SHIELD | Admitting: *Deleted

## 2018-01-30 DIAGNOSIS — I493 Ventricular premature depolarization: Secondary | ICD-10-CM

## 2018-01-30 LAB — BASIC METABOLIC PANEL
BUN/Creatinine Ratio: 17 (ref 9–23)
BUN: 14 mg/dL (ref 6–24)
CALCIUM: 9.8 mg/dL (ref 8.7–10.2)
CO2: 25 mmol/L (ref 20–29)
CREATININE: 0.84 mg/dL (ref 0.57–1.00)
Chloride: 103 mmol/L (ref 96–106)
GFR calc non Af Amer: 77 mL/min/{1.73_m2} (ref >59)
GFR, EST AFRICAN AMERICAN: 89 mL/min/{1.73_m2} (ref >59)
Glucose: 121 mg/dL — ABNORMAL HIGH (ref 65–99)
Potassium: 3.9 mmol/L (ref 3.5–5.2)
Sodium: 142 mmol/L (ref 134–144)

## 2018-01-30 LAB — MAGNESIUM: Magnesium: 1.9 mg/dL (ref 1.6–2.3)

## 2018-01-30 LAB — TSH: TSH: 2.2 u[IU]/mL (ref 0.450–4.500)

## 2018-02-05 NOTE — Addendum Note (Signed)
Addended by: Virl AxePATE INGALLS, Krystyne Tewksbury L on: 02/05/2018 03:27 PM   Modules accepted: Orders

## 2018-02-06 ENCOUNTER — Telehealth: Payer: Self-pay | Admitting: Cardiology

## 2018-02-06 NOTE — Telephone Encounter (Signed)
New Message:    Patient calling because she is to have a MRI and patient states no one has called her. Please call patient.

## 2018-02-06 NOTE — Telephone Encounter (Signed)
Spoke with the patient, she was waiting on a call for scheduling. Advised the patient that I sent the schedulers a message.

## 2018-02-17 ENCOUNTER — Encounter: Payer: Self-pay | Admitting: *Deleted

## 2018-02-20 ENCOUNTER — Other Ambulatory Visit: Payer: BLUE CROSS/BLUE SHIELD | Admitting: *Deleted

## 2018-02-20 DIAGNOSIS — E785 Hyperlipidemia, unspecified: Secondary | ICD-10-CM

## 2018-02-20 LAB — HEPATIC FUNCTION PANEL
ALT: 15 IU/L (ref 0–32)
AST: 12 IU/L (ref 0–40)
Albumin: 4.4 g/dL (ref 3.5–5.5)
Alkaline Phosphatase: 65 IU/L (ref 39–117)
Bilirubin Total: 0.7 mg/dL (ref 0.0–1.2)
Bilirubin, Direct: 0.18 mg/dL (ref 0.00–0.40)
Total Protein: 6.8 g/dL (ref 6.0–8.5)

## 2018-02-20 LAB — LIPID PANEL
Chol/HDL Ratio: 3.2 ratio (ref 0.0–4.4)
Cholesterol, Total: 168 mg/dL (ref 100–199)
HDL: 52 mg/dL (ref >39)
LDL Calculated: 105 mg/dL — ABNORMAL HIGH (ref 0–99)
Triglycerides: 55 mg/dL (ref 0–149)
VLDL Cholesterol Cal: 11 mg/dL (ref 5–40)

## 2018-03-03 ENCOUNTER — Ambulatory Visit (HOSPITAL_COMMUNITY)
Admission: RE | Admit: 2018-03-03 | Discharge: 2018-03-03 | Disposition: A | Discharge status: Home / Self Care | Payer: BLUE CROSS/BLUE SHIELD | Source: Ambulatory Visit | Attending: Cardiology | Admitting: Cardiology

## 2018-03-03 DIAGNOSIS — I493 Ventricular premature depolarization: Secondary | ICD-10-CM | POA: Diagnosis not present

## 2018-03-03 MED ORDER — GADOBUTROL 1 MMOL/ML IV SOLN
10.0000 mL | Freq: Once | INTRAVENOUS | Status: AC | PRN
  Administered 2018-03-03: 9 mL via INTRAVENOUS

## 2018-03-04 ENCOUNTER — Encounter: Payer: Self-pay | Admitting: Internal Medicine

## 2018-03-04 ENCOUNTER — Ambulatory Visit: Payer: BLUE CROSS/BLUE SHIELD | Admitting: Internal Medicine

## 2018-03-04 VITALS — BP 132/84 | HR 80 | Ht 64.0 in | Wt 217.0 lb

## 2018-03-04 DIAGNOSIS — R002 Palpitations: Secondary | ICD-10-CM | POA: Diagnosis not present

## 2018-03-04 DIAGNOSIS — I493 Ventricular premature depolarization: Secondary | ICD-10-CM

## 2018-03-04 MED ORDER — METOPROLOL SUCCINATE ER 50 MG PO TB24: ORAL_TABLET | ORAL | 3 refills | Status: DC

## 2018-03-04 NOTE — Patient Instructions (Addendum)
Medication Instructions:  Your physician has recommended you make the following change in your medication:   1.  Start taking TOPROL XL 50 mg---  A.  Start taking Toprol XL 50 mg--Take 1/2 tablet tomorrow 03/05/2018 and 03/06/2018  B.  On 03/07/2018 increase the Toprol XL 50 mg --Take one tablet by mouth daily.  2.  On 03/07/2018 when you increase your TOPROL XL 50 mg to one tablet by mouth daily-- STOP TAKING YOUR CARTIA  Labwork: None ordered.  Testing/Procedures: None ordered.  Follow-Up: Your physician wants you to follow-up in: January 2020 with Dr. Ladona Ridgelaylor  April 09, 2018 at 11:00 am.  Please arrive 15 minutes early to check in.   Any Other Special Instructions Will Be Listed Below (If Applicable).  If you need a refill on your cardiac medications before your next appointment, please call your pharmacy.

## 2018-03-04 NOTE — Progress Notes (Signed)
HPI [name] is referred today for evaluation of PVC's. She is a pleasant 57 yo woman with HTN. She has known HTN. She began to experience palpitations several months ago. She wore a cardiac monitor demonstrating over 90% PVC's, mostly monomorphic. She was placed on cardizem. She continues to be symptomatic. She denies excess caffeine or ETOH use. She has not had syncope. No anginal symptoms.  Allergies  Allergen Reactions  . Buprenorphine Hcl Itching  . Morphine And Related Itching  . Other   . Short Ragweed Pollen Ext      Current Outpatient Medications  Medication Sig Dispense Refill  . aspirin 81 MG chewable tablet Chew 81 mg by mouth daily.     Marland Kitchen. atorvastatin (LIPITOR) 40 MG tablet Take 1 tablet (40 mg total) by mouth daily. 90 tablet 3  . cholecalciferol (VITAMIN D) 1000 UNITS tablet Take 1,000 Units by mouth daily.    Marland Kitchen. estradiol (VIVELLE-DOT) 0.05 MG/24HR patch Place 1 patch onto the skin 2 (two) times a week.   0  . hydrochlorothiazide (HYDRODIURIL) 25 MG tablet Take 25 mg by mouth daily.   0  . potassium chloride 20 MEQ TBCR Take 20 mEq by mouth daily. 30 tablet 3  . metoprolol succinate (TOPROL XL) 50 MG 24 hr tablet Take 1/2 tablet by mouth for 2 days.  Then increase to one tablet by mouth daily. 90 tablet 3  . PROAIR HFA 108 (90 Base) MCG/ACT inhaler Inhale 2 puffs into the lungs as needed for shortness of breath.  5   No current facility-administered medications for this visit.      Past Medical History:  Diagnosis Date  . Hyperlipidemia   . Hypertension   . Palpitations   . PVC (premature ventricular contraction)     ROS:   All systems reviewed and negative except as noted in the HPI.   Past Surgical History:  Procedure Laterality Date  . APPENDECTOMY    . carpal tunn    . CESAREAN SECTION    . NEUROMA SURGERY    . SEPTOPLASTY    . TONSILLECTOMY    . TOTAL ABDOMINAL HYSTERECTOMY W/ BILATERAL SALPINGOOPHORECTOMY    . TUBAL LIGATION        Family History  Problem Relation Age of Onset  . Hyperlipidemia Sister   . Hyperlipidemia Brother      Social History   Socioeconomic History  . Marital status: Married    Spouse name: Not on file  . Number of children: Not on file  . Years of education: Not on file  . Highest education level: Not on file  Occupational History  . Not on file  Social Needs  . Financial resource strain: Not on file  . Food insecurity:    Worry: Not on file    Inability: Not on file  . Transportation needs:    Medical: Not on file    Non-medical: Not on file  Tobacco Use  . Smoking status: Never Smoker  . Smokeless tobacco: Never Used  Substance and Sexual Activity  . Alcohol use: Yes    Alcohol/week: 0.0 standard drinks    Comment: occsaionl wine  . Drug use: Not on file  . Sexual activity: Not on file  Lifestyle  . Physical activity:    Days per week: Not on file    Minutes per session: Not on file  . Stress: Not on file  Relationships  . Social connections:    Talks on  phone: Not on file    Gets together: Not on file    Attends religious service: Not on file    Active member of club or organization: Not on file    Attends meetings of clubs or organizations: Not on file    Relationship status: Not on file  . Intimate partner violence:    Fear of current or ex partner: Not on file    Emotionally abused: Not on file    Physically abused: Not on file    Forced sexual activity: Not on file  Other Topics Concern  . 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     BP 132/84   Pulse 80   Ht 5\' 4"  (1.626 m)   Wt 217 lb (98.4 kg)   SpO2  99%   BMI 37.25 kg/m   Physical Exam:  Well appearing 57 yo woman, NAD HEENT: Unremarkable Neck:  6 cm JVD, no thyromegally Lymphatics:  No adenopathy Back:  No CVA tenderness Lungs:  Clear with no wheezes HEART:  IRegular rate rhythm, no murmurs, no rubs, no clicks Abd:  soft, positive bowel sounds, no organomegally, no rebound, no guarding Ext:  2 plus pulses, no edema, no cyanosis, no clubbing Skin:  No rashes no nodules Neuro:  CN II through XII intact, motor grossly intact  EKG - nsr  With PVC's in a trigeminal distribution.   Assess/Plan: 1. 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 I have recommended we switch from a calcium channel to a beta blocker. We also discussed both flecainide and catheter ablation. She will try medical therapy. 2. HTN - her blood pressure is well controlled. She may require other treatment when we switch from her calcium blocker to a beta blocker  Leonia Reeves.D.

## 2018-03-13 ENCOUNTER — Encounter: Payer: Self-pay | Admitting: Internal Medicine

## 2018-04-09 ENCOUNTER — Ambulatory Visit: Payer: BLUE CROSS/BLUE SHIELD | Admitting: Internal Medicine

## 2018-04-09 ENCOUNTER — Encounter: Payer: Self-pay | Admitting: Internal Medicine

## 2018-04-09 VITALS — BP 172/86 | HR 68 | Ht 64.0 in | Wt 219.4 lb

## 2018-04-09 DIAGNOSIS — I493 Ventricular premature depolarization: Secondary | ICD-10-CM

## 2018-04-09 DIAGNOSIS — R002 Palpitations: Secondary | ICD-10-CM | POA: Diagnosis not present

## 2018-04-09 MED ORDER — FLECAINIDE ACETATE 150 MG PO TABS: 75.0000 mg | ORAL_TABLET | Freq: Two times a day (BID) | ORAL | 3 refills | Status: DC

## 2018-04-09 NOTE — Patient Instructions (Addendum)
Medication Instructions:  Your physician has recommended you make the following change in your medication:   1.  Start taking flecainide 150 mg--Take 1/2 tablet (75 mg) by mouth twice a day  Labwork: None ordered.  Testing/Procedures: [facility]3 weeks for a 12 lead EKG and blood pressure check--please schedule  Follow-Up: Your physician wants you to follow-up in: 6-8 weeks from today with Dr. Iva Boopaylor-office visit  Any Other Special Instructions Will Be Listed Below (If Applicable).  If you need a refill on your cardiac medications before your next appointment, please call your pharmacy.   Flecainide tablets What is this medicine? FLECAINIDE (FLEK a nide) is an antiarrhythmic drug. This medicine is used to prevent irregular heart rhythm. It can also slow down fast heartbeats called tachycardia. This medicine may be used for other purposes; ask your health care provider or pharmacist if you have questions. COMMON BRAND NAME(S): Tambocor What should I tell my health care provider before I take this medicine? They need to know if you have any of these conditions: -abnormal levels of potassium in the blood -heart disease including heart rhythm and heart rate problems -kidney or liver disease -recent heart attack -an unusual or allergic reaction to flecainide, local anesthetics, other medicines, foods, dyes, or preservatives -pregnant or trying to get pregnant -breast-feeding How should I use this medicine? Take this medicine by mouth with a glass of water. Follow the directions on the prescription label. You can take this medicine with or without food. Take your doses at regular intervals. Do not take your medicine more often than directed. Do not stop taking this medicine suddenly. This may cause serious, heart-related side effects. If your doctor wants you to stop the medicine, the dose may be slowly lowered over time to avoid any side  effects. Talk to your pediatrician regarding the use of this medicine in children. While this drug may be prescribed for children as young as 1 year of age for selected conditions, precautions do apply. Overdosage: If you think you have taken too much of this medicine contact a poison control center or emergency room at once. NOTE: This medicine is only for you. Do not share this medicine with others. What if I miss a dose? If you miss a dose, take it as soon as you can. If it is almost time for your next dose, take only that dose. Do not take double or extra doses. What may interact with this medicine? Do not take this medicine with any of the following medications: -amoxapine -arsenic trioxide -certain antibiotics like clarithromycin, erythromycin, gatifloxacin, gemifloxacin, levofloxacin, moxifloxacin, sparfloxacin, or troleandomycin -certain antidepressants called tricyclic antidepressants like amitriptyline, imipramine, or nortriptyline -certain medicines to control heart rhythm like disopyramide, dofetilide, encainide, moricizine, procainamide, propafenone, and quinidine -cisapride -cyclobenzaprine -delavirdine -droperidol -haloperidol -hawthorn -imatinib -levomethadyl -maprotiline -medicines for malaria like chloroquine and halofantrine -pentamidine -phenothiazines like chlorpromazine, mesoridazine, prochlorperazine, thioridazine -pimozide -quinine -ranolazine -ritonavir -sertindole -ziprasidone This medicine may also interact with the following medications: -cimetidine -medicines for angina or high blood pressure -medicines to control heart rhythm like amiodarone and digoxin This list may not describe all possible interactions. Give your health care provider a list of all the medicines, herbs, non-prescription drugs, or dietary supplements you use. Also tell them if you smoke, drink alcohol, or use illegal drugs. Some items may interact with your medicine. What should I  watch for while using this medicine? Visit your doctor or health care professional for  regular checks on your progress. Because your condition and the use of this medicine carries some risk, it is a good idea to carry an identification card, necklace or bracelet with details of your condition, medications and doctor or health care professional. Check your blood pressure and pulse rate regularly. Ask your health care professional what your blood pressure and pulse rate should be, and when you should contact him or her. Your doctor or health care professional also may schedule regular blood tests and electrocardiograms to check your progress. You may get drowsy or dizzy. Do not drive, use machinery, or do anything that needs mental alertness until you know how this medicine affects you. Do not stand or sit up quickly, especially if you are an older patient. This reduces the risk of dizzy or fainting spells. Alcohol can make you more dizzy, increase flushing and rapid heartbeats. Avoid alcoholic drinks. What side effects may I notice from receiving this medicine? Side effects that you should report to your doctor or health care professional as soon as possible: -chest pain, continued irregular heartbeats -difficulty breathing -swelling of the legs or feet -trembling, shaking -unusually weak or tired Side effects that usually do not require medical attention (report to your doctor or health care professional if they continue or are bothersome): -blurred vision -constipation -headache -nausea, vomiting -stomach pain This list may not describe all possible side effects. Call your doctor for medical advice about side effects. You may report side effects to FDA at 1-800-FDA-1088. Where should I keep my medicine? Keep out of the reach of children. Store at room temperature between 15 and 30 degrees C (59 and 86 degrees F). Protect from light. Keep container tightly closed. Throw away any unused medicine  after the expiration date. NOTE: This sheet is a summary. It may not cover all possible information. If you have questions about this medicine, talk to your doctor, pharmacist, or health care provider.  2019 Elsevier/Gold Standard (2007-07-09 16:46:09)

## 2018-04-09 NOTE — Progress Notes (Signed)
HPI [name] returns today for followup of her PVC's. She was switched from a calcium channel blocker to a beta blocker when I saw her last for her 10%PVC's. In the interim she has done continued to have palpitations. She cannot tell much of a difference in her density of palpitations. No syncope. No chest pain. Minimal dyspnea with exertion. No edema.  Allergies  Allergen Reactions  . Buprenorphine Hcl Itching  . Morphine And Related Itching  . Other   . Short Ragweed Pollen Ext      Current Outpatient Medications  Medication Sig Dispense Refill  . aspirin 81 MG chewable tablet Chew 81 mg by mouth daily.     Marland Kitchen atorvastatin (LIPITOR) 40 MG tablet Take 1 tablet (40 mg total) by mouth daily. 90 tablet 3  . cholecalciferol (VITAMIN D) 1000 UNITS tablet Take 1,000 Units by mouth daily.    Marland Kitchen estradiol (VIVELLE-DOT) 0.05 MG/24HR patch Place 1 patch onto the skin 2 (two) times a week.   0  . hydrochlorothiazide (HYDRODIURIL) 25 MG tablet Take 25 mg by mouth daily.   0  . metoprolol succinate (TOPROL XL) 50 MG 24 hr tablet Take 1/2 tablet by mouth for 2 days.  Then increase to one tablet by mouth daily. 90 tablet 3  . potassium chloride 20 MEQ TBCR Take 20 mEq by mouth daily. 30 tablet 3  . PROAIR HFA 108 (90 Base) MCG/ACT inhaler Inhale 2 puffs into the lungs as needed for shortness of breath.  5   No current facility-administered medications for this visit.      Past Medical History:  Diagnosis Date  . Hyperlipidemia   . Hypertension   . Palpitations   . PVC (premature ventricular contraction)     ROS:   All systems reviewed and negative except as noted in the HPI.   Past Surgical History:  Procedure Laterality Date  . APPENDECTOMY    . carpal tunn    . CESAREAN SECTION    . NEUROMA SURGERY    . SEPTOPLASTY    . TONSILLECTOMY    . TOTAL ABDOMINAL HYSTERECTOMY W/ BILATERAL SALPINGOOPHORECTOMY    . TUBAL LIGATION       Family History  Problem Relation Age of  Onset  . Hyperlipidemia Sister   . Hyperlipidemia Brother      Social History   Socioeconomic History  . Marital status: Married    Spouse name: Not on file  . Number of children: Not on file  . Years of education: Not on file  . Highest education level: Not on file  Occupational History  . Not on file  Social Needs  . Financial resource strain: Not on file  . Food insecurity:    Worry: Not on file    Inability: Not on file  . Transportation needs:    Medical: Not on file    Non-medical: Not on file  Tobacco Use  . Smoking status: Never Smoker  . Smokeless tobacco: Never Used  Substance and Sexual Activity  . Alcohol use: Yes    Alcohol/week: 0.0 standard drinks    Comment: occsaionl wine  . Drug use: Never  . Sexual activity: Not on file  Lifestyle  . Physical activity:    Days per week: Not on file    Minutes per session: Not on file  . Stress: Not on file  Relationships  . Social connections:    Talks on phone: Not on file  Gets together: Not on file    Attends religious service: Not on file    Active member of club or organization: Not on file    Attends meetings of clubs or organizations: Not on file    Relationship status: Not on file  . Intimate partner violence:    Fear of current or ex partner: Not on file    Emotionally abused: Not on file    Physically abused: Not on file    Forced sexual activity: Not on file  Other Topics Concern  . 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     BP (!) 172/86   Pulse 68   Ht 5\' 4"  (1.626 m)   Wt 219 lb 6.4 oz (99.5 kg)   SpO2 98%   BMI 37.66 kg/m   Physical  Exam:  Well appearing NAD HEENT: Unremarkable Neck:  No JVD, no thyromegally Lymphatics:  No adenopathy Back:  No CVA tenderness Lungs:  Clear with no wheezes HEART:  Regular rate rhythm, no murmurs, no rubs, no clicks Abd:  soft, positive bowel sounds, no organomegally, no rebound, no guarding Ext:  2 plus pulses, no edema, no cyanosis, no clubbing Skin:  No rashes no nodules Neuro:  CN II through XII intact, motor grossly intact  EKG - nsr with pvc's   Assess/Plan: 1. Symptomatic PVC's - she is not improved. I have recommended she start low dose flecainide. She will start 75 mg twice daily and return in a couple of weeks for an ECG.  2. Uncontrolled HTN - I plan to switch her from toprol to coreg when I see her back in a couple of weeks. We will uptitrate the coreg to help with blood pressure control. 3. Obesity - her bmi is 37. She is encouraged to lose weight.   I spent over 25 minutes including over 50% face to face time with the patient.   Leonia Reeves.D.

## 2018-04-30 ENCOUNTER — Ambulatory Visit (INDEPENDENT_AMBULATORY_CARE_PROVIDER_SITE_OTHER): Payer: BLUE CROSS/BLUE SHIELD

## 2018-04-30 VITALS — BP 122/76 | HR 50 | Ht 64.0 in | Wt 220.0 lb

## 2018-04-30 DIAGNOSIS — I493 Ventricular premature depolarization: Secondary | ICD-10-CM | POA: Diagnosis not present

## 2018-04-30 MED ORDER — METOPROLOL SUCCINATE ER 50 MG PO TB24: 50.0000 mg | ORAL_TABLET | Freq: Every day | ORAL | 3 refills | Status: DC

## 2018-04-30 NOTE — Patient Instructions (Signed)
Medication Instructions:  Decrease Toprol to 50 mg, daily, by mouth If you need a refill on your cardiac medications before your next appointment, please call your pharmacy.   Lab work: None If you have labs (blood work) drawn today and your tests are completely normal, you will receive your results only by: Marland Kitchen MyChart Message (if you have MyChart) OR . A paper copy in the mail If you have any lab test that is abnormal or we need to change your treatment, we will call you to review the results.  Testing/Procedures: None

## 2018-04-30 NOTE — Progress Notes (Signed)
1.) Reason for visit: EKG and BP  2.) Name of MD requesting visit: Dr. Ladona Ridgel  3.) H&P: PVCs  4.) ROS related to problem: Fatigue, SOB  5.) Assessment and plan per MD: The patient has been fatigued since going up on her metoprolol. Spoke with Dr. Johney Frame (DOD), he stated that the patient should continue flecainide and decrease Toprol to 50 mg, daily, by mouth.

## 2018-05-01 ENCOUNTER — Telehealth: Payer: Self-pay

## 2018-05-01 MED ORDER — METOPROLOL SUCCINATE ER 50 MG PO TB24: 50.0000 mg | ORAL_TABLET | Freq: Every day | ORAL | 3 refills | Status: DC

## 2018-05-01 NOTE — Telephone Encounter (Signed)
Spoke to patient and informed her that we will refill her Metoprolol.  She thanked [place] for the call.

## 2018-05-22 ENCOUNTER — Ambulatory Visit: Payer: BLUE CROSS/BLUE SHIELD | Admitting: Internal Medicine

## 2018-05-22 ENCOUNTER — Encounter: Payer: Self-pay | Admitting: Internal Medicine

## 2018-05-22 VITALS — BP 128/80 | HR 57 | Ht 64.0 in | Wt 217.0 lb

## 2018-05-22 DIAGNOSIS — R002 Palpitations: Secondary | ICD-10-CM

## 2018-05-22 DIAGNOSIS — I493 Ventricular premature depolarization: Secondary | ICD-10-CM

## 2018-05-22 MED ORDER — METOPROLOL SUCCINATE ER 50 MG PO TB24: 50.0000 mg | ORAL_TABLET | Freq: Every day | ORAL | 3 refills | Status: DC

## 2018-05-22 NOTE — Patient Instructions (Addendum)
Medication Instructions:  Your physician recommends that you continue on your current medications as directed. Please refer to the Current Medication list given to you today.  Labwork: None ordered.  Testing/Procedures: None ordered.  Follow-Up: Your physician wants you to follow-up in: 4-5 months with Dr. Taylor.   You will receive a reminder letter in the mail two months in advance. If you don't receive a letter, please call our office to schedule the follow-up appointment.   Any Other Special Instructions Will Be Listed Below (If Applicable).  If you need a refill on your cardiac medications before your next appointment, please call your pharmacy.   

## 2018-05-22 NOTE — Progress Notes (Signed)
HPI Mrs. Kammeyer returns today for followup of her symptomatic PVC's, HTN, and obesity. In the interim she has done well. She has had a marked reduction in her PVC's. She has few palpitations. No chest pain or sob. She is walking 5 miles a week. Allergies  Allergen Reactions  . Buprenorphine Hcl Itching  . Morphine And Related Itching  . Other   . Short Ragweed Pollen Ext      Current Outpatient Medications  Medication Sig Dispense Refill  . aspirin 81 MG chewable tablet Chew 81 mg by mouth daily.     Marland Kitchen atorvastatin (LIPITOR) 40 MG tablet Take 1 tablet (40 mg total) by mouth daily. 90 tablet 3  . cholecalciferol (VITAMIN D) 1000 UNITS tablet Take 1,000 Units by mouth daily.    Marland Kitchen estradiol (VIVELLE-DOT) 0.05 MG/24HR patch Place 1 patch onto the skin 2 (two) times a week.   0  . flecainide (TAMBOCOR) 150 MG tablet Take 0.5 tablets (75 mg total) by mouth 2 (two) times daily. 90 tablet 3  . hydrochlorothiazide (HYDRODIURIL) 25 MG tablet Take 25 mg by mouth daily.   0  . metoprolol succinate (TOPROL-XL) 50 MG 24 hr tablet Take 1 tablet (50 mg total) by mouth daily. Take with or immediately following a meal. 90 tablet 3  . potassium chloride 20 MEQ TBCR Take 20 mEq by mouth daily. 30 tablet 3  . PROAIR HFA 108 (90 Base) MCG/ACT inhaler Inhale 2 puffs into the lungs as needed for shortness of breath.  5   No current facility-administered medications for this visit.      Past Medical History:  Diagnosis Date  . Hyperlipidemia   . Hypertension   . Palpitations   . PVC (premature ventricular contraction)     ROS:   All systems reviewed and negative except as noted in the HPI.   Past Surgical History:  Procedure Laterality Date  . APPENDECTOMY    . carpal tunn    . CESAREAN SECTION    . NEUROMA SURGERY    . SEPTOPLASTY    . TONSILLECTOMY    . TOTAL ABDOMINAL HYSTERECTOMY W/ BILATERAL SALPINGOOPHORECTOMY    . TUBAL LIGATION       Family History  Problem Relation Age  of Onset  . Hyperlipidemia Sister   . Hyperlipidemia Brother      Social History   Socioeconomic History  . Marital status: Married    Spouse name: Not on file  . Number of children: Not on file  . Years of education: Not on file  . Highest education level: Not on file  Occupational History  . Not on file  Social Needs  . Financial resource strain: Not on file  . Food insecurity:    Worry: Not on file    Inability: Not on file  . Transportation needs:    Medical: Not on file    Non-medical: Not on file  Tobacco Use  . Smoking status: Never Smoker  . Smokeless tobacco: Never Used  Substance and Sexual Activity  . Alcohol use: Yes    Alcohol/week: 0.0 standard drinks    Comment: occsaionl wine  . Drug use: Never  . Sexual activity: Not on file  Lifestyle  . Physical activity:    Days per week: Not on file    Minutes per session: Not on file  . Stress: Not on file  Relationships  . Social connections:    Talks on phone: Not on file  Gets together: Not on file    Attends religious service: Not on file    Active member of club or organization: Not on file    Attends meetings of clubs or organizations: Not on file    Relationship status: Not on file  . Intimate partner violence:    Fear of current or ex partner: Not on file    Emotionally abused: Not on file    Physically abused: Not on file    Forced sexual activity: Not on file  Other Topics Concern  . 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     BP 128/80   Pulse (!) 57   Ht 5\' 4"  (1.626 m)   Wt 217 lb (98.4 kg)   SpO2 99%   BMI 37.25 kg/m   Physical  Exam:  overweight appearing NAD HEENT: Unremarkable Neck:  No JVD, no thyromegally Lymphatics:  No adenopathy Back:  No CVA tenderness Lungs:  Clear HEART:  Regular rate rhythm, no murmurs, no rubs, no clicks Abd:  soft, positive bowel sounds, no organomegally, no rebound, no guarding Ext:  2 plus pulses, no edema, no cyanosis, no clubbing Skin:  No rashes no nodules Neuro:  CN II through XII intact, motor grossly intact  EKG - sinus brady   Assess/Plan: 1. PVC's - she has done much better on flecainide and her ECG is reassuring. She will continue flecainide 75 bid and toprol. 2. HTN - she will continue her diuretic and beta blocker. I encouraged her to lose weight. 3. Obesity - Again, I asked her to increase her physical activity. 4. Dyslipidemia - she will continue her statin. No muscle pain or aches.   Leonia Reeves.D.

## 2018-05-27 NOTE — Addendum Note (Signed)
Addended by: Christiana Gurevich on: 05/27/2018 12:26 PM   Modules accepted: Orders  

## 2018-11-07 ENCOUNTER — Telehealth: Payer: Self-pay

## 2018-11-07 NOTE — Telephone Encounter (Signed)
Spoke with pt regarding covid-19 screening prior to appt. Pt stated she has not been in contact with anyone who have covid-19 and has no symptoms.

## 2018-11-10 ENCOUNTER — Other Ambulatory Visit: Payer: Self-pay

## 2018-11-10 ENCOUNTER — Ambulatory Visit (INDEPENDENT_AMBULATORY_CARE_PROVIDER_SITE_OTHER): Payer: BLUE CROSS/BLUE SHIELD | Admitting: Internal Medicine

## 2018-11-10 ENCOUNTER — Encounter: Payer: Self-pay | Admitting: Internal Medicine

## 2018-11-10 VITALS — BP 126/72 | HR 60 | Ht 64.0 in | Wt 220.0 lb

## 2018-11-10 DIAGNOSIS — I493 Ventricular premature depolarization: Secondary | ICD-10-CM

## 2018-11-10 MED ORDER — METOPROLOL SUCCINATE ER 50 MG PO TB24: 50.0000 mg | ORAL_TABLET | Freq: Every day | ORAL | 3 refills | Status: DC

## 2018-11-10 MED ORDER — FLECAINIDE ACETATE 150 MG PO TABS: 75.0000 mg | ORAL_TABLET | Freq: Two times a day (BID) | ORAL | 3 refills | Status: DC

## 2018-11-10 NOTE — Progress Notes (Signed)
HPI Ms. Vandervort returns today for followup of her PVC"s. She is a pleasant 58 yo woman who I saw almost 6 months ago. At that time she had improvement in her PVC's on flecainide. In the interim, she has done well with almost no PVC"s. She is walking without difficulty. Her BP has been well controlled.   Allergies  Allergen Reactions  . Buprenorphine Hcl Itching  . Morphine And Related Itching  . Other   . Short Ragweed Pollen Ext      Current Outpatient Medications  Medication Sig Dispense Refill  . aspirin 81 MG chewable tablet Chew 81 mg by mouth daily.     Marland Kitchen atorvastatin (LIPITOR) 40 MG tablet Take 1 tablet (40 mg total) by mouth daily. 90 tablet 3  . cholecalciferol (VITAMIN D) 1000 UNITS tablet Take 1,000 Units by mouth daily.    Marland Kitchen estradiol (VIVELLE-DOT) 0.05 MG/24HR patch Place 1 patch onto the skin 2 (two) times a week.   0  . flecainide (TAMBOCOR) 150 MG tablet Take 0.5 tablets (75 mg total) by mouth 2 (two) times daily. 90 tablet 3  . hydrochlorothiazide (HYDRODIURIL) 25 MG tablet Take 25 mg by mouth daily.   0  . metoprolol succinate (TOPROL-XL) 50 MG 24 hr tablet Take 1 tablet (50 mg total) by mouth daily. Take with or immediately following a meal. 90 tablet 3  . potassium chloride 20 MEQ TBCR Take 20 mEq by mouth daily. 30 tablet 3  . PROAIR HFA 108 (90 Base) MCG/ACT inhaler Inhale 2 puffs into the lungs as needed for shortness of breath.  5   No current facility-administered medications for this visit.      Past Medical History:  Diagnosis Date  . Hyperlipidemia   . Hypertension   . Palpitations   . PVC (premature ventricular contraction)     ROS:   All systems reviewed and negative except as noted in the HPI.   Past Surgical History:  Procedure Laterality Date  . APPENDECTOMY    . carpal tunn    . CESAREAN SECTION    . NEUROMA SURGERY    . SEPTOPLASTY    . TONSILLECTOMY    . TOTAL ABDOMINAL HYSTERECTOMY W/ BILATERAL SALPINGOOPHORECTOMY    . TUBAL  LIGATION       Family History  Problem Relation Age of Onset  . Hyperlipidemia Sister   . Hyperlipidemia Brother      Social History   Socioeconomic History  . Marital status: Married    Spouse name: Not on file  . Number of children: Not on file  . Years of education: Not on file  . Highest education level: Not on file  Occupational History  . Not on file  Social Needs  . Financial resource strain: Not on file  . Food insecurity    Worry: Not on file    Inability: Not on file  . Transportation needs    Medical: Not on file    Non-medical: Not on file  Tobacco Use  . Smoking status: Never Smoker  . Smokeless tobacco: Never Used  Substance and Sexual Activity  . Alcohol use: Yes    Alcohol/week: 0.0 standard drinks    Comment: occsaionl wine  . Drug use: Never  . Sexual activity: Not on file  Lifestyle  . Physical activity    Days per week: Not on file    Minutes per session: Not on file  . Stress: Not on file  Relationships  .  Social Musicianconnections    Talks on phone: Not on file    Gets together: Not on file    Attends religious service: Not on file    Active member of club or organization: Not on file    Attends meetings of clubs or organizations: Not on file    Relationship status: Not on file  . Intimate partner violence    Fear of current or ex partner: Not on file    Emotionally abused: Not on file    Physically abused: Not on file    Forced sexual activity: Not on file  Other Topics Concern  . 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     BP 126/72   Pulse 60   Ht 5\' 4"  (1.626 m)   Wt 220 lb (99.8 kg)   SpO2  98%   BMI 37.76 kg/m   Physical Exam:  Well appearing 58 yo woman, NAD HEENT: Unremarkable Neck:  No JVD, no thyromegally Lymphatics:  No adenopathy Back:  No CVA tenderness Lungs:  Clear with no wheezes HEART:  Regular rate rhythm, no murmurs, no rubs, no clicks Abd:  soft, positive bowel sounds, no organomegally, no rebound, no guarding Ext:  2 plus pulses, no edema, no cyanosis, no clubbing Skin:  No rashes no nodules Neuro:  CN II through XII intact, motor grossly intact  EKG - nsr   Assess/Plan: 1. PVC"s - she is well controlled on flecainide and metoprolol. She will continue both. I will see her back in a year. 2. HTN - her bp is controlled on a diuretic and a beta blocker.  Leonia ReevesGregg Dung Prien,M.D.

## 2018-11-10 NOTE — Patient Instructions (Addendum)
Medication Instructions:  Your physician recommends that you continue on your current medications as directed. Please refer to the Current Medication list given to you today. Refills of your Metoprolol and Flecainide have been sent to your pharmacy   Lab work: None Ordered    Testing/Procedures: None Ordered    Follow-Up: Your physician recommends that you schedule a follow-up appointment in: 1 year with Dr. Lovena Le. You will receive a letter a few months before your appointment to call our office and schedule the visit.

## 2018-12-23 ENCOUNTER — Encounter: Payer: Self-pay | Admitting: Cardiology

## 2018-12-23 ENCOUNTER — Other Ambulatory Visit: Payer: Self-pay

## 2018-12-23 ENCOUNTER — Ambulatory Visit: Payer: BLUE CROSS/BLUE SHIELD | Admitting: Cardiology

## 2018-12-23 VITALS — BP 124/66 | HR 69 | Ht 64.0 in | Wt 221.8 lb

## 2018-12-23 DIAGNOSIS — I493 Ventricular premature depolarization: Secondary | ICD-10-CM | POA: Diagnosis not present

## 2018-12-23 DIAGNOSIS — I10 Essential (primary) hypertension: Secondary | ICD-10-CM | POA: Diagnosis not present

## 2018-12-23 DIAGNOSIS — E785 Hyperlipidemia, unspecified: Secondary | ICD-10-CM | POA: Diagnosis not present

## 2018-12-23 NOTE — Progress Notes (Signed)
Cardiology Office Note:    Date:  12/23/2018   ID:  [name], DOB [birth year], MRN [id number]  PCP:  Shirlean Mylar, MD  Cardiologist:  No primary care provider on file.    Referring MD: Shirlean Mylar, MD   Chief Complaint  Patient presents with  . Hypertension    History of Present Illness:    [name] is a 58 y.o. female with a hx of HTN, dyslipidemia and palpitationsrelated to PVCs while on estrogen therapy. This improved with reduction of HRT. She had a 2D echo in 2014 that showed normal LVEF at 65%, trivial AI and trivial MR with grade 1 DD. She is on medical therapy for her HTN and DLD with Toprol , HCTZ and simvastatin. Both chronic conditions are followed by her PCP. Dr. Okey Regal Web.  She is here today for followup and is doing well.  She is very active and walks several times a week with no problems with CP or SOB.  She did have one episode recently where her husband became concerned in the middle of the night that she was not breathing right and appeared to be gasping for breath.  She woke up and felt SOB.  She took her BP and the systolic BP was .  She toke it a while later and it was 189/30mmHg and then later in the am it was 120/15mmHg.  She has not had any other higher readings and BP otherwise has been under great control.  She says that she thinks she had [employer] barbecue the day before which she usually dose not eat. Otherwise she denies any chest pain or pressure, SOB, DOE, PND, orthopnea, LE edema, dizziness, palpitations or syncope. Her husband says that she snores and she has never had a sleep study. She is compliant with her meds and is tolerating meds with no SE.    Past Medical History:  Diagnosis Date  . Hyperlipidemia   . Hypertension   . Palpitations   . PVC (premature ventricular contraction)     Past Surgical History:  Procedure Laterality Date  . APPENDECTOMY    . carpal tunn    . CESAREAN SECTION    . NEUROMA SURGERY    .  SEPTOPLASTY    . TONSILLECTOMY    . TOTAL ABDOMINAL HYSTERECTOMY W/ BILATERAL SALPINGOOPHORECTOMY    . TUBAL LIGATION      Current Medications: No outpatient medications have been marked as taking for the 12/23/18 encounter (Office Visit) with Quintella Reichert, MD.     Allergies:   Buprenorphine hcl, Morphine and related, Other, and Short ragweed pollen ext   Social History   Socioeconomic History  . Marital status: Married    Spouse name: Not on file  . Number of children: Not on file  . Years of education: Not on file  . Highest education level: Not on file  Occupational History  . Not on file  Social Needs  . Financial resource strain: Not on file  . Food insecurity    Worry: Not on file    Inability: Not on file  . Transportation needs    Medical: Not on file    Non-medical: Not on file  Tobacco Use  . Smoking status: Never Smoker  . Smokeless tobacco: Never Used  Substance and Sexual Activity  . Alcohol use: Yes    Alcohol/week: 0.0 standard drinks    Comment: occsaionl wine  . Drug use: Never  . Sexual activity: Not on file  Lifestyle  . Physical activity    Days per week: Not on file    Minutes per session: Not on file  . Stress: Not on file  Relationships  . Social Musicianconnections    Talks on phone: Not on file    Gets together: Not on file    Attends religious service: Not on file    Active member of club or organization: Not on file    Attends meetings of clubs or organizations: Not on file    Relationship status: Not on file  Other Topics Concern  . 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     Family History: The  patient's family history includes Hyperlipidemia in her brother and sister.  ROS:   Please see the history of present illness.    ROS  All other systems reviewed and negative.   EKGs/Labs/Other Studies Reviewed:    The following studies were reviewed today: none  EKG:  EKG is not  ordered today.    Recent Labs: 01/30/2018: BUN 14; Creatinine, Ser 0.84; Magnesium 1.9; Potassium 3.9; Sodium 142; TSH 2.200 02/20/2018: ALT 15   Recent Lipid Panel    Component Value Date/Time   CHOL 168 02/20/2018 0914   TRIG 55 02/20/2018 0914   HDL 52 02/20/2018 0914   CHOLHDL 3.2 02/20/2018 0914   LDLCALC 105 (H) 02/20/2018 0914    Physical Exam:    VS:  There were no vitals taken for this visit.    Wt Readings from Last 3 Encounters:  11/10/18 220 lb (99.8 kg)  05/22/18 217 lb (98.4 kg)  04/30/18 220 lb (99.8 kg)     GEN:  Well nourished, well developed in no acute distress HEENT: Normal NECK: No JVD; No carotid bruits LYMPHATICS: No lymphadenopathy CARDIAC: RRR, no murmurs, rubs, gallops RESPIRATORY:  Clear to auscultation without rales, wheezing or rhonchi  ABDOMEN: Soft, non-tender, non-distended MUSCULOSKELETAL:  No edema; No deformity  SKIN: Warm and dry NEUROLOGIC:  Alert and oriented x 3 PSYCHIATRIC:  Normal affect   ASSESSMENT:    1. Benign essential HTN   2. PVC (premature ventricular contraction)   3. Dyslipidemia    PLAN:    In order of problems listed above:  1.  HTN -BP controlled on exam but had one morning recently where is was markedly elevated associated with SOB that awakened her from sleep - suspect this was related to eating too much salt the day before likely related to barbecue she ate. -BP has been well controlled otherwise -continue HCTZ 25mg  daily and Toprol XL 50mg  daily -I have recommended a sleep study to rule out sleep apnea given marked BP elevation in the setting of awakening gasping for breath and snoring.  2 . PVCs -controlled on  BB -denies any palpitations  3.  HLD -LDL goal <100 -LDL was 105 in 02/2018.  -Repeat FLp and ALT -continue atorvastatin 40mg  daily   Medication Adjustments/Labs and Tests Ordered: Current medicines are reviewed at length with the patient today.  Concerns regarding medicines are outlined  above.  No orders of the defined types were placed in this encounter.  No orders of the defined types were placed in this encounter.   Signed, Fransico Him, MD  12/23/2018 12:37 PM    [place]

## 2018-12-23 NOTE — Patient Instructions (Addendum)
Medication Instructions:   If you need a refill on your cardiac medications before your next appointment, please call your pharmacy.   Lab work: Your physician recommends that you have lab work today- Lipid panel and ALT  If you have labs (blood work) drawn today and your tests are completely normal, you will receive your results only by: Marland Kitchen MyChart Message (if you have MyChart) OR . A paper copy in the mail If you have any lab test that is abnormal or we need to change your treatment, we will call you to review the results.  Testing/Procedures: Your physician has recommended that you have a sleep study. This test records several body functions during sleep, including: brain activity, eye movement, oxygen and carbon dioxide blood levels, heart rate and rhythm, breathing rate and rhythm, the flow of air through your mouth and nose, snoring, body muscle movements, and chest and belly movement.  Follow-Up: At [facility], you and your health needs are our priority.  As part of our continuing mission to provide you with exceptional heart care, we have created designated Provider Care Teams.  These Care Teams include your primary Cardiologist (physician) and Advanced Practice Providers (APPs -  Physician Assistants and Nurse Practitioners) who all work together to provide you with the care you need, when you need it. You will need a follow up appointment in 12 months.  Please call our office 2 months in advance to schedule this appointment.  You may see Dr. Radford Pax or one of the following Advanced Practice Providers on your designated Care Team:   [place], PA-C Melina Copa, PA-C . Ermalinda Barrios, PA-C

## 2018-12-24 ENCOUNTER — Telehealth: Payer: Self-pay

## 2018-12-24 ENCOUNTER — Telehealth: Payer: Self-pay | Admitting: *Deleted

## 2018-12-24 LAB — LIPID PANEL
Chol/HDL Ratio: 3.3 ratio (ref 0.0–4.4)
Cholesterol, Total: 145 mg/dL (ref 100–199)
HDL: 44 mg/dL (ref >39)
LDL Chol Calc (NIH): 86 mg/dL (ref 0–99)
Triglycerides: 75 mg/dL (ref 0–149)
VLDL Cholesterol Cal: 15 mg/dL (ref 5–40)

## 2018-12-24 LAB — ALT: ALT: 13 IU/L (ref 0–32)

## 2018-12-24 NOTE — Telephone Encounter (Signed)
Staff message sent to [name] denied in lab sleep study. Approved HST. Valid DOS 12/24/18 to 06/22/19. Order # [id number].

## 2018-12-24 NOTE — Telephone Encounter (Signed)
Notes recorded by Frederik Schmidt, RN on 12/24/2018 at 9:18 AM EDT  The patient has been notified of the result and verbalized understanding. All questions (if any) were answered.  Frederik Schmidt, RN 12/24/2018 9:18 AM

## 2018-12-24 NOTE — Telephone Encounter (Signed)
-----   Message from Sueanne Margarita, MD sent at 12/24/2018  9:06 AM EDT ----- Lipids at goal continue current therapy and forward to PCP

## 2018-12-25 ENCOUNTER — Telehealth: Payer: Self-pay | Admitting: *Deleted

## 2018-12-25 DIAGNOSIS — I10 Essential (primary) hypertension: Secondary | ICD-10-CM

## 2018-12-25 DIAGNOSIS — R0683 Snoring: Secondary | ICD-10-CM

## 2018-12-25 DIAGNOSIS — R0689 Other abnormalities of breathing: Secondary | ICD-10-CM

## 2018-12-25 NOTE — Telephone Encounter (Signed)
yes

## 2018-12-25 NOTE — Telephone Encounter (Signed)
-----   Message from Lauralee Evener, [place] sent at 12/24/2018 11:58 AM EDT ----- Regarding: RE: precert [employer] denied in lab study. Approved HST. Auth # [id number] Valid dates 12/24/18 to 06/22/2019. ----- Message ----- From: Freada Bergeron, CMA Sent: 12/23/2018   2:01 PM EDT To: [facility] Sleep Studies Subject: precert                                        Split night ----- Message ----- From: Michaelyn Barter, RN Sent: 12/23/2018   1:25 PM EDT To: Freada Bergeron, CMA  Dr. Radford Pax wanted this patient to get a sleep study.  Thanks, Pam

## 2018-12-25 NOTE — Telephone Encounter (Signed)
Send this to her then ?

## 2018-12-29 NOTE — Telephone Encounter (Signed)
Patient is aware and of Home Sleep Study through [facility]. Patient is scheduled for 12/4 at 11am to pick up home sleep kit and meet with Respiratory therapist at [facility]. Patient is aware that if this appointment date and time does not work for them they should contact Artis Delay directly at [phone]. Patient is aware that a sleep packet will be sent from [facility] in week. Left detailed message on voicemail with date and time of titration and informed patient to call back to confirm or reschedule.

## 2019-01-13 ENCOUNTER — Other Ambulatory Visit: Payer: Self-pay | Admitting: Cardiology

## 2019-02-20 ENCOUNTER — Other Ambulatory Visit: Payer: Self-pay

## 2019-02-20 ENCOUNTER — Ambulatory Visit (HOSPITAL_BASED_OUTPATIENT_CLINIC_OR_DEPARTMENT_OTHER): Payer: BLUE CROSS/BLUE SHIELD

## 2019-04-10 ENCOUNTER — Other Ambulatory Visit: Payer: Self-pay

## 2019-04-10 ENCOUNTER — Emergency Department (HOSPITAL_COMMUNITY): Payer: BLUE CROSS/BLUE SHIELD

## 2019-04-10 ENCOUNTER — Encounter (HOSPITAL_COMMUNITY): Payer: Self-pay | Admitting: *Deleted

## 2019-04-10 ENCOUNTER — Other Ambulatory Visit: Payer: BLUE CROSS/BLUE SHIELD

## 2019-04-10 ENCOUNTER — Emergency Department (HOSPITAL_COMMUNITY)
Admission: EM | Admit: 2019-04-10 | Discharge: 2019-04-10 | Disposition: A | Discharge status: Home / Self Care | Payer: BLUE CROSS/BLUE SHIELD | Attending: Emergency Medicine | Admitting: Emergency Medicine

## 2019-04-10 DIAGNOSIS — U071 COVID-19: Secondary | ICD-10-CM | POA: Diagnosis not present

## 2019-04-10 DIAGNOSIS — R519 Headache, unspecified: Secondary | ICD-10-CM | POA: Diagnosis not present

## 2019-04-10 DIAGNOSIS — Z79899 Other long term (current) drug therapy: Secondary | ICD-10-CM | POA: Diagnosis not present

## 2019-04-10 DIAGNOSIS — R0609 Other forms of dyspnea: Secondary | ICD-10-CM | POA: Diagnosis not present

## 2019-04-10 DIAGNOSIS — R0602 Shortness of breath: Secondary | ICD-10-CM | POA: Insufficient documentation

## 2019-04-10 DIAGNOSIS — I10 Essential (primary) hypertension: Secondary | ICD-10-CM | POA: Insufficient documentation

## 2019-04-10 DIAGNOSIS — Z7982 Long term (current) use of aspirin: Secondary | ICD-10-CM | POA: Insufficient documentation

## 2019-04-10 DIAGNOSIS — R05 Cough: Secondary | ICD-10-CM | POA: Diagnosis present

## 2019-04-10 LAB — CBC WITH DIFFERENTIAL/PLATELET
Abs Immature Granulocytes: 0.02 10*3/uL (ref 0.00–0.07)
Basophils Absolute: 0 10*3/uL (ref 0.0–0.1)
Basophils Relative: 0 %
Eosinophils Absolute: 0 10*3/uL (ref 0.0–0.5)
Eosinophils Relative: 0 %
HCT: 37.1 % (ref 36.0–46.0)
Hemoglobin: 12.1 g/dL (ref 12.0–15.0)
Immature Granulocytes: 0 %
Lymphocytes Relative: 12 %
Lymphs Abs: 0.8 10*3/uL (ref 0.7–4.0)
MCH: 30.2 pg (ref 26.0–34.0)
MCHC: 32.6 g/dL (ref 30.0–36.0)
MCV: 92.5 fL (ref 80.0–100.0)
Monocytes Absolute: 0.5 10*3/uL (ref 0.1–1.0)
Monocytes Relative: 7 %
Neutro Abs: 5.3 10*3/uL (ref 1.7–7.7)
Neutrophils Relative %: 81 %
Platelets: 238 10*3/uL (ref 150–400)
RBC: 4.01 MIL/uL (ref 3.87–5.11)
RDW: 12.8 % (ref 11.5–15.5)
WBC: 6.6 10*3/uL (ref 4.0–10.5)
nRBC: 0 % (ref 0.0–0.2)

## 2019-04-10 LAB — COMPREHENSIVE METABOLIC PANEL
ALT: 25 U/L (ref 0–44)
AST: 36 U/L (ref 15–41)
Albumin: 3.5 g/dL (ref 3.5–5.0)
Alkaline Phosphatase: 39 U/L (ref 38–126)
Anion gap: 9 (ref 5–15)
BUN: 9 mg/dL (ref 6–20)
CO2: 29 mmol/L (ref 22–32)
Calcium: 8.7 mg/dL — ABNORMAL LOW (ref 8.9–10.3)
Chloride: 96 mmol/L — ABNORMAL LOW (ref 98–111)
Creatinine, Ser: 0.92 mg/dL (ref 0.44–1.00)
GFR calc Af Amer: >60 mL/min (ref >60)
GFR calc non Af Amer: >60 mL/min (ref >60)
Glucose, Bld: 128 mg/dL — ABNORMAL HIGH (ref 70–99)
Potassium: 3.6 mmol/L (ref 3.5–5.1)
Sodium: 134 mmol/L — ABNORMAL LOW (ref 135–145)
Total Bilirubin: 1.1 mg/dL (ref 0.3–1.2)
Total Protein: 7.1 g/dL (ref 6.5–8.1)

## 2019-04-10 LAB — POC SARS CORONAVIRUS 2 AG -  ED: SARS Coronavirus 2 Ag: POSITIVE — AB

## 2019-04-10 MED ORDER — ACETAMINOPHEN 325 MG PO TABS
650.0000 mg | ORAL_TABLET | Freq: Once | ORAL | Status: AC
  Administered 2019-04-10: 14:00:00 650 mg via ORAL
  Filled 2019-04-10: qty 2

## 2019-04-10 MED ORDER — SODIUM CHLORIDE 0.9 % IV BOLUS
1000.0000 mL | Freq: Once | INTRAVENOUS | Status: AC
  Administered 2019-04-10: 1000 mL via INTRAVENOUS

## 2019-04-10 MED ORDER — DEXAMETHASONE SODIUM PHOSPHATE 10 MG/ML IJ SOLN
10.0000 mg | Freq: Once | INTRAMUSCULAR | Status: AC
  Administered 2019-04-10: 14:00:00 10 mg via INTRAVENOUS
  Filled 2019-04-10: qty 1

## 2019-04-10 MED ORDER — SODIUM CHLORIDE 0.9 % IV SOLN: INTRAVENOUS | Status: DC

## 2019-04-10 NOTE — ED Provider Notes (Signed)
[facility] DEPT Provider Note   CSN: [id number] Arrival date & time: 04/10/19  1208     History Chief Complaint  Patient presents with  . Chills  . Diarrhea  . Shortness of Breath    [name] is a 59 y.o. female.  59 year old female diagnosed with Covid 2 days ago presents with cough and shortness of breath.  She has had no vomiting or abdominal pain or chest pain.  Has had watery diarrhea.  Notes weakness and feeling of dehydration.  Her weakness is diffuse and worse when she stands up.  Mild headache without neck pain or photophobia.  Patient notes slight dyspnea on exertion when she walks but overall denies shortness of breath        Past Medical History:  Diagnosis Date  . Hyperlipidemia   . Hypertension   . Palpitations   . PVC (premature ventricular contraction)     Patient Active Problem List   Diagnosis Date Noted  . Primary osteoarthritis of first carpometacarpal joint of left hand 05/03/2015  . Radial styloid tenosynovitis 05/03/2015  . PVC (premature ventricular contraction) 02/05/2014  . Benign essential HTN 02/05/2014  . Dyslipidemia 02/05/2014    Past Surgical History:  Procedure Laterality Date  . APPENDECTOMY    . carpal tunn    . CESAREAN SECTION    . NEUROMA SURGERY    . SEPTOPLASTY    . TONSILLECTOMY    . TOTAL ABDOMINAL HYSTERECTOMY W/ BILATERAL SALPINGOOPHORECTOMY    . TUBAL LIGATION       OB History   No obstetric history on file.     Family History  Problem Relation Age of Onset  . Hyperlipidemia Sister   . Hyperlipidemia Brother     Social History   Tobacco Use  . Smoking status: Never Smoker  . Smokeless tobacco: Never Used  Substance Use Topics  . Alcohol use: Yes    Alcohol/week: 0.0 standard drinks    Comment: occsaionl wine  . Drug use: Never    Home Medications Prior to Admission medications   Medication Sig Start Date End Date Taking? Authorizing Provider  aspirin  81 MG chewable tablet Chew 81 mg by mouth daily.     [provider]  atorvastatin (LIPITOR) 40 MG tablet Take 1 tablet by mouth once daily 01/13/19   Sueanne Margarita, MD  cholecalciferol (VITAMIN D) 1000 UNITS tablet Take 1,000 Units by mouth daily.    [provider]  estradiol (VIVELLE-DOT) 0.05 MG/24HR patch Place 1 patch onto the skin 2 (two) times a week.  01/08/14   [provider]  flecainide (TAMBOCOR) 150 MG tablet Take 0.5 tablets (75 mg total) by mouth 2 (two) times daily. 11/10/18   Evans Lance, MD  hydrochlorothiazide (HYDRODIURIL) 25 MG tablet Take 25 mg by mouth daily.  11/03/13   [provider]  metoprolol succinate (TOPROL-XL) 50 MG 24 hr tablet Take 1 tablet (50 mg total) by mouth daily. Take with or immediately following a meal. 11/10/18 11/10/19  Evans Lance, MD  potassium chloride 20 MEQ TBCR Take 20 mEq by mouth daily. 02/08/14   Sueanne Margarita, MD  PROAIR HFA 108 [phone] Base) MCG/ACT inhaler Inhale 2 puffs into the lungs as needed for shortness of breath. 01/30/18   [provider]    Allergies    Buprenorphine hcl, Morphine and related, Other, and Short ragweed pollen ext  Review of Systems   Review of Systems  All  other systems reviewed and are negative.   Physical Exam Updated Vital Signs BP 134/75 (BP Location: Right Arm)   Pulse 99   Temp 100.3 F (37.9 C) (Oral)   Resp 18   Ht 1.702 m (5\' 7" )   Wt 98.9 kg   SpO2 95%   BMI 34.14 kg/m   Physical Exam Vitals and nursing note reviewed.  Constitutional:      General: She is not in acute distress.    Appearance: Normal appearance. She is well-developed. She is not toxic-appearing.  HENT:     Head: Normocephalic and atraumatic.  Eyes:     General: Lids are normal.     Conjunctiva/sclera: Conjunctivae normal.     Pupils: Pupils are equal, round, and reactive to light.  Neck:     Thyroid: No thyroid mass.     Trachea: No tracheal deviation.    Cardiovascular:     Rate and Rhythm: Normal rate and regular rhythm.     Heart sounds: Normal heart sounds. No murmur. No gallop.   Pulmonary:     Effort: Pulmonary effort is normal. No respiratory distress.     Breath sounds: Normal breath sounds. No stridor. No decreased breath sounds, wheezing, rhonchi or rales.  Abdominal:     General: Bowel sounds are normal. There is no distension.     Palpations: Abdomen is soft.     Tenderness: There is no abdominal tenderness. There is no rebound.  Musculoskeletal:        General: No tenderness. Normal range of motion.     Cervical back: Normal range of motion and neck supple.  Skin:    General: Skin is warm and dry.     Findings: No abrasion or rash.  Neurological:     Mental Status: She is alert and oriented to person, place, and time.     GCS: GCS eye subscore is 4. GCS verbal subscore is 5. GCS motor subscore is 6.     Cranial Nerves: No cranial nerve deficit.     Sensory: No sensory deficit.  Psychiatric:        Speech: Speech normal.        Behavior: Behavior normal.     ED Results / Procedures / Treatments   Labs (all labs ordered are listed, but only abnormal results are displayed) Labs Reviewed  CBC WITH DIFFERENTIAL/PLATELET  COMPREHENSIVE METABOLIC PANEL  POC SARS CORONAVIRUS 2 AG -  ED    EKG EKG Interpretation  Date/Time:  Friday April 10 2019 12:29:05 EST Ventricular Rate:  99 PR Interval:    QRS Duration: 88 QT Interval:  372 QTC Calculation: 478 R Axis:   23 Text Interpretation: Sinus rhythm Nonspecific T abnormalities, anterior leads Confirmed by [id number] (Lorre Nick) on 04/10/2019 12:41:26 PM   Radiology No results found.  Procedures Procedures (including critical care time)  Medications Ordered in ED Medications  sodium chloride 0.9 % bolus 1,000 mL (has no administration in time range)  0.9 %  sodium chloride infusion (has no administration in time range)    ED Course  I have reviewed the  triage vital signs and the nursing notes.  Pertinent labs & imaging results that were available during my care of the patient were reviewed by me and considered in my medical decision making (see chart for details).    MDM Rules/Calculators/A&P                      Patient with IV  fluids here and also better.  Chest pain consistent with Covid pneumonia.  She is not hypoxic.  She feels safe to go home.  Return precautions given Final Clinical Impression(s) / ED Diagnoses Final diagnoses:  COVID-19    Rx / DC Orders ED Discharge Orders    None       Lorre Nick, MD 04/10/19 1426

## 2019-04-10 NOTE — ED Triage Notes (Signed)
Tested + Covid on Wednesday, states low 02 sats by watch, 95% RA today, chills and diarrhea

## 2019-04-10 NOTE — Discharge Instructions (Addendum)
Person Under Monitoring Name: [name]  Location: [address] [place] [address]   Infection Prevention Recommendations for Individuals Confirmed to have, or Being Evaluated for, 2019 Novel Coronavirus (COVID-19) Infection Who Receive Care at Home  Individuals who are confirmed to have, or are being evaluated for, COVID-19 should follow the prevention steps below until a healthcare provider or local or state health department says they can return to normal activities.  Stay home except to get medical care You should restrict activities outside your home, except for getting medical care. Do not go to work, school, or public areas, and do not use public transportation or taxis.  Call ahead before visiting your doctor Before your medical appointment, call the healthcare provider and tell them that you have, or are being evaluated for, COVID-19 infection. This will help the healthcare provider's office take steps to keep other people from getting infected. Ask your healthcare provider to call the local or state health department.  Monitor your symptoms Seek prompt medical attention if your illness is worsening (e.g., difficulty breathing). Before going to your medical appointment, call the healthcare provider and tell them that you have, or are being evaluated for, COVID-19 infection. Ask your healthcare provider to call the local or state health department.  Wear a facemask You should wear a facemask that covers your nose and mouth when you are in the same room with other people and when you visit a healthcare provider. People who live with or visit you should also wear a facemask while they are in the same room with you.  Separate yourself from other people in your home As much as possible, you should stay in a different room from other people in your home. Also, you should use a separate bathroom, if available.  Avoid sharing household items You should not  share dishes, drinking glasses, cups, eating utensils, towels, bedding, or other items with other people in your home. After using these items, you should wash them thoroughly with soap and water.  Cover your coughs and sneezes Cover your mouth and nose with a tissue when you cough or sneeze, or you can cough or sneeze into your sleeve. Throw used tissues in a lined trash can, and immediately wash your hands with soap and water for at least 20 seconds or use an alcohol-based hand rub.  Wash your [employer] your hands often and thoroughly with soap and water for at least 20 seconds. You can use an alcohol-based hand sanitizer if soap and water are not available and if your hands are not visibly dirty. Avoid touching your eyes, nose, and mouth with unwashed hands.   Prevention Steps for Caregivers and Household Members of Individuals Confirmed to have, or Being Evaluated for, COVID-19 Infection Being Cared for in the Home  If you live with, or provide care at home for, a person confirmed to have, or being evaluated for, COVID-19 infection please follow these guidelines to prevent infection:  Follow healthcare provider's instructions Make sure that you understand and can help the patient follow any healthcare provider instructions for all care.  Provide for the patient's basic needs You should help the patient with basic needs in the home and provide support for getting groceries, prescriptions, and other personal needs.  Monitor the patient's symptoms If they are getting sicker, call his or her medical provider and tell them that the patient has, or is being evaluated for, COVID-19 infection. This will help the healthcare provider's office  take steps to keep other people from getting infected. Ask the healthcare provider to call the local or state health department.  Limit the number of people who have contact with the patient If possible, have only one caregiver for the  patient. Other household members should stay in another home or place of residence. If this is not possible, they should stay in another room, or be separated from the patient as much as possible. Use a separate bathroom, if available. Restrict visitors who do not have an essential need to be in the home.  Keep older adults, very young children, and other sick people away from the patient Keep older adults, very young children, and those who have compromised immune systems or chronic health conditions away from the patient. This includes people with chronic heart, lung, or kidney conditions, diabetes, and cancer.  Ensure good ventilation Make sure that shared spaces in the home have good air flow, such as from an air conditioner or an opened window, weather permitting.  Wash your hands often Wash your hands often and thoroughly with soap and water for at least 20 seconds. You can use an alcohol based hand sanitizer if soap and water are not available and if your hands are not visibly dirty. Avoid touching your eyes, nose, and mouth with unwashed hands. Use disposable paper towels to dry your hands. If not available, use dedicated cloth towels and replace them when they become wet.  Wear a facemask and gloves Wear a disposable facemask at all times in the room and gloves when you touch or have contact with the patient's blood, body fluids, and/or secretions or excretions, such as sweat, saliva, sputum, nasal mucus, vomit, urine, or feces.  Ensure the mask fits over your nose and mouth tightly, and do not touch it during use. Throw out disposable facemasks and gloves after using them. Do not reuse. Wash your hands immediately after removing your facemask and gloves. If your personal clothing becomes contaminated, carefully remove clothing and launder. Wash your hands after handling contaminated clothing. Place all used disposable facemasks, gloves, and other waste in a lined container before  disposing them with other household waste. Remove gloves and wash your hands immediately after handling these items.  Do not share dishes, glasses, or other household items with the patient Avoid sharing household items. You should not share dishes, drinking glasses, cups, eating utensils, towels, bedding, or other items with a patient who is confirmed to have, or being evaluated for, COVID-19 infection. After the person uses these items, you should wash them thoroughly with soap and water.  Wash laundry thoroughly Immediately remove and wash clothes or bedding that have blood, body fluids, and/or secretions or excretions, such as sweat, saliva, sputum, nasal mucus, vomit, urine, or feces, on them. Wear gloves when handling laundry from the patient. Read and follow directions on labels of laundry or clothing items and detergent. In general, wash and dry with the warmest temperatures recommended on the label.  Clean all areas the individual has used often Clean all touchable surfaces, such as counters, tabletops, doorknobs, bathroom fixtures, toilets, phones, keyboards, tablets, and bedside tables, every day. Also, clean any surfaces that may have blood, body fluids, and/or secretions or excretions on them. Wear gloves when cleaning surfaces the patient has come in contact with. Use a diluted bleach solution (e.g., dilute bleach with 1 part bleach and 10 parts water) or a household disinfectant with a label that says EPA-registered for coronaviruses. To make a bleach  solution at home, add 1 tablespoon of bleach to 1 quart (4 cups) of water. For a larger supply, add  cup of bleach to 1 gallon (16 cups) of water. Read labels of cleaning products and follow recommendations provided on product labels. Labels contain instructions for safe and effective use of the cleaning product including precautions you should take when applying the product, such as wearing gloves or eye protection and making sure you  have good ventilation during use of the product. Remove gloves and wash hands immediately after cleaning.  Monitor yourself for signs and symptoms of illness Caregivers and household members are considered close contacts, should monitor their health, and will be asked to limit movement outside of the home to the extent possible. Follow the monitoring steps for close contacts listed on the symptom monitoring form.   ? If you have additional questions, contact your local health department or call the epidemiologist on call at [phone] (available 24/7). ? This guidance is subject to change. For the most up-to-date guidance from [facility], please refer to their website: [email or web address]

## 2019-04-11 ENCOUNTER — Telehealth: Payer: Self-pay | Admitting: Infectious Diseases

## 2019-04-11 NOTE — Telephone Encounter (Signed)
Called to discuss with patient about Covid symptoms and the use of bamlanivimab, a monoclonal antibody infusion for those with mild to moderate Covid symptoms and at a high risk of hospitalization.  Pt is qualified for this infusion at the [facility] infusion center due to Age >55 and Hypertension   Unable to leave message MyChart message sent

## 2019-04-12 ENCOUNTER — Other Ambulatory Visit: Payer: Self-pay | Admitting: Adult Health

## 2019-04-12 ENCOUNTER — Telehealth: Payer: Self-pay | Admitting: Infectious Diseases

## 2019-04-12 DIAGNOSIS — U071 COVID-19: Secondary | ICD-10-CM

## 2019-04-12 NOTE — Telephone Encounter (Signed)
Called to discuss with patient about Covid symptoms and the use of bamlanivimab, a monoclonal antibody infusion for those with mild to moderate Covid symptoms and at a high risk of hospitalization.  Pt is qualified for this infusion at the [facility] infusion center due to Age >55 and Hypertension.    Her symptoms started She had a fever most last night and just really really dry. She eats ice and drinks water to help keep her hydrated. She has not eaten much of an appetite   Her BPs have been 122/78. Has continued to take her BP medications including HCTZ daily. Suggested to continue with fluids but add some electrolytes including salt with Pedialyte, Chicken broth/soup considering she is not eating much of anything. Suggested to call her primary care team to also see if holding the HCTZ for a short period would be OK given her difficulty with fluid management and recent ER admission for IVF.   I will put her on the cancellation list for tomorrow 1/25 but this would be the last day she could get therapy.

## 2019-04-12 NOTE — Progress Notes (Signed)
  I connected by phone with [name] on 04/12/2019 at 4:44 PM to discuss the potential use of an new treatment for mild to moderate COVID-19 viral infection in non-hospitalized patients.  This patient is a 59 y.o. female that meets the FDA criteria for Emergency Use Authorization of bamlanivimab or casirivimab\imdevimab.  Has a (+) direct SARS-CoV-2 viral test result  Has mild or moderate COVID-19   Is ? 59 years of age and weighs ? 40 kg  Is NOT hospitalized due to COVID-19  Is NOT requiring oxygen therapy or requiring an increase in baseline oxygen flow rate due to COVID-19  Is within 10 days of symptom onset  Has at least one of the high risk factor(s) for progression to severe COVID-19 and/or hospitalization as defined in EUA.  Specific high risk criteria : Cardiovascular Disease; HTN at age 41  Symptom onset on 1/18   I have spoken and communicated the following to the patient or parent/caregiver:  1. FDA has authorized the emergency use of bamlanivimab and casirivimab\imdevimab for the treatment of mild to moderate COVID-19 in adults and pediatric patients with positive results of direct SARS-CoV-2 viral testing who are 15 years of age and older weighing at least 40 kg, and who are at high risk for progressing to severe COVID-19 and/or hospitalization.  2. The significant known and potential risks and benefits of bamlanivimab and casirivimab\imdevimab, and the extent to which such potential risks and benefits are unknown.  3. Information on available alternative treatments and the risks and benefits of those alternatives, including clinical trials.  4. Patients treated with bamlanivimab and casirivimab\imdevimab should continue to self-isolate and use infection control measures (e.g., wear mask, isolate, social distance, avoid sharing personal items, clean and disinfect "high touch" surfaces, and frequent handwashing) according to CDC guidelines.   5. The patient or  parent/caregiver has the option to accept or refuse bamlanivimab or casirivimab\imdevimab .  After reviewing this information with the patient, The patient agreed to proceed with receiving the bamlanimivab infusion and will be provided a copy of the Fact sheet prior to receiving the infusion.[name] 04/12/2019 4:44 PM

## 2019-04-13 ENCOUNTER — Other Ambulatory Visit: Payer: Self-pay

## 2019-04-13 ENCOUNTER — Ambulatory Visit (HOSPITAL_COMMUNITY)
Admission: RE | Admit: 2019-04-13 | Discharge: 2019-04-13 | Disposition: A | Discharge status: Home / Self Care | Payer: BLUE CROSS/BLUE SHIELD | Source: Ambulatory Visit | Attending: Pulmonary Disease | Admitting: Pulmonary Disease

## 2019-04-13 ENCOUNTER — Encounter (HOSPITAL_COMMUNITY): Payer: Self-pay | Admitting: Emergency Medicine

## 2019-04-13 ENCOUNTER — Emergency Department (HOSPITAL_COMMUNITY): Payer: BLUE CROSS/BLUE SHIELD

## 2019-04-13 ENCOUNTER — Inpatient Hospital Stay (HOSPITAL_COMMUNITY)
Admission: EM | Admit: 2019-04-13 | Discharge: 2019-04-18 | DRG: 177 | Disposition: A | Discharge status: Home / Self Care | Payer: BLUE CROSS/BLUE SHIELD | Attending: Internal Medicine | Admitting: Internal Medicine

## 2019-04-13 DIAGNOSIS — J9601 Acute respiratory failure with hypoxia: Secondary | ICD-10-CM

## 2019-04-13 DIAGNOSIS — Z885 Allergy status to narcotic agent status: Secondary | ICD-10-CM

## 2019-04-13 DIAGNOSIS — J1282 Pneumonia due to coronavirus disease 2019: Secondary | ICD-10-CM | POA: Diagnosis present

## 2019-04-13 DIAGNOSIS — Z79899 Other long term (current) drug therapy: Secondary | ICD-10-CM

## 2019-04-13 DIAGNOSIS — Z9079 Acquired absence of other genital organ(s): Secondary | ICD-10-CM | POA: Diagnosis not present

## 2019-04-13 DIAGNOSIS — U071 COVID-19: Principal | ICD-10-CM

## 2019-04-13 DIAGNOSIS — E876 Hypokalemia: Secondary | ICD-10-CM | POA: Diagnosis present

## 2019-04-13 DIAGNOSIS — Z6834 Body mass index (BMI) 34.0-34.9, adult: Secondary | ICD-10-CM

## 2019-04-13 DIAGNOSIS — E669 Obesity, unspecified: Secondary | ICD-10-CM | POA: Diagnosis present

## 2019-04-13 DIAGNOSIS — I10 Essential (primary) hypertension: Secondary | ICD-10-CM | POA: Diagnosis present

## 2019-04-13 DIAGNOSIS — I493 Ventricular premature depolarization: Secondary | ICD-10-CM

## 2019-04-13 DIAGNOSIS — Z8349 Family history of other endocrine, nutritional and metabolic diseases: Secondary | ICD-10-CM | POA: Diagnosis not present

## 2019-04-13 DIAGNOSIS — Z90722 Acquired absence of ovaries, bilateral: Secondary | ICD-10-CM

## 2019-04-13 DIAGNOSIS — E785 Hyperlipidemia, unspecified: Secondary | ICD-10-CM

## 2019-04-13 DIAGNOSIS — Z888 Allergy status to other drugs, medicaments and biological substances status: Secondary | ICD-10-CM | POA: Diagnosis not present

## 2019-04-13 DIAGNOSIS — Z90710 Acquired absence of both cervix and uterus: Secondary | ICD-10-CM

## 2019-04-13 DIAGNOSIS — Z7982 Long term (current) use of aspirin: Secondary | ICD-10-CM

## 2019-04-13 LAB — CBC WITH DIFFERENTIAL/PLATELET
Abs Immature Granulocytes: 0.12 10*3/uL — ABNORMAL HIGH (ref 0.00–0.07)
Basophils Absolute: 0 10*3/uL (ref 0.0–0.1)
Basophils Relative: 0 %
Eosinophils Absolute: 0 10*3/uL (ref 0.0–0.5)
Eosinophils Relative: 0 %
HCT: 39.6 % (ref 36.0–46.0)
Hemoglobin: 12.5 g/dL (ref 12.0–15.0)
Immature Granulocytes: 1 %
Lymphocytes Relative: 6 %
Lymphs Abs: 0.7 10*3/uL (ref 0.7–4.0)
MCH: 29.5 pg (ref 26.0–34.0)
MCHC: 31.6 g/dL (ref 30.0–36.0)
MCV: 93.4 fL (ref 80.0–100.0)
Monocytes Absolute: 0.5 10*3/uL (ref 0.1–1.0)
Monocytes Relative: 4 %
Neutro Abs: 9.1 10*3/uL — ABNORMAL HIGH (ref 1.7–7.7)
Neutrophils Relative %: 89 %
Platelets: 361 10*3/uL (ref 150–400)
RBC: 4.24 MIL/uL (ref 3.87–5.11)
RDW: 13.1 % (ref 11.5–15.5)
WBC: 10.4 10*3/uL (ref 4.0–10.5)
nRBC: 0 % (ref 0.0–0.2)

## 2019-04-13 LAB — LACTIC ACID, PLASMA: Lactic Acid, Venous: 2.5 mmol/L (ref 0.5–1.9)

## 2019-04-13 LAB — COMPREHENSIVE METABOLIC PANEL
ALT: 32 U/L (ref 0–44)
AST: 44 U/L — ABNORMAL HIGH (ref 15–41)
Albumin: 3.3 g/dL — ABNORMAL LOW (ref 3.5–5.0)
Alkaline Phosphatase: 45 U/L (ref 38–126)
Anion gap: 12 (ref 5–15)
BUN: 11 mg/dL (ref 6–20)
CO2: 26 mmol/L (ref 22–32)
Calcium: 8.9 mg/dL (ref 8.9–10.3)
Chloride: 101 mmol/L (ref 98–111)
Creatinine, Ser: 0.8 mg/dL (ref 0.44–1.00)
GFR calc Af Amer: >60 mL/min (ref >60)
GFR calc non Af Amer: >60 mL/min (ref >60)
Glucose, Bld: 139 mg/dL — ABNORMAL HIGH (ref 70–99)
Potassium: 3.2 mmol/L — ABNORMAL LOW (ref 3.5–5.1)
Sodium: 139 mmol/L (ref 135–145)
Total Bilirubin: 1.2 mg/dL (ref 0.3–1.2)
Total Protein: 7.2 g/dL (ref 6.5–8.1)

## 2019-04-13 LAB — C-REACTIVE PROTEIN: CRP: 15.2 mg/dL — ABNORMAL HIGH (ref <1.0)

## 2019-04-13 LAB — ABO/RH: ABO/RH(D): O POS

## 2019-04-13 LAB — I-STAT BETA HCG BLOOD, ED (MC, WL, AP ONLY): I-stat hCG, quantitative: <5 m[IU]/mL (ref <5)

## 2019-04-13 LAB — MAGNESIUM: Magnesium: 2 mg/dL (ref 1.7–2.4)

## 2019-04-13 LAB — LACTATE DEHYDROGENASE: LDH: 431 U/L — ABNORMAL HIGH (ref 98–192)

## 2019-04-13 LAB — TRIGLYCERIDES: Triglycerides: 94 mg/dL (ref <150)

## 2019-04-13 LAB — PROCALCITONIN: Procalcitonin: 0.17 ng/mL

## 2019-04-13 LAB — D-DIMER, QUANTITATIVE: D-Dimer, Quant: 2.2 ug/mL-FEU — ABNORMAL HIGH (ref 0.00–0.50)

## 2019-04-13 LAB — FERRITIN: Ferritin: 1561 ng/mL — ABNORMAL HIGH (ref 11–307)

## 2019-04-13 LAB — FIBRINOGEN: Fibrinogen: 623 mg/dL — ABNORMAL HIGH (ref 210–475)

## 2019-04-13 MED ORDER — ZINC SULFATE 220 (50 ZN) MG PO CAPS
220.0000 mg | ORAL_CAPSULE | Freq: Every day | ORAL | Status: DC
  Administered 2019-04-13 – 2019-04-18 (×6): 220 mg via ORAL
  Filled 2019-04-13 (×5): qty 1

## 2019-04-13 MED ORDER — EPINEPHRINE 0.3 MG/0.3ML IJ SOAJ: 0.3000 mg | Freq: Once | INTRAMUSCULAR | Status: DC | PRN

## 2019-04-13 MED ORDER — POTASSIUM CHLORIDE IN NACL 20-0.9 MEQ/L-% IV SOLN
INTRAVENOUS | Status: DC
  Filled 2019-04-13 (×2): qty 1000

## 2019-04-13 MED ORDER — ENOXAPARIN SODIUM 40 MG/0.4ML ~~LOC~~ SOLN
40.0000 mg | SUBCUTANEOUS | Status: DC
  Administered 2019-04-13 – 2019-04-17 (×5): 40 mg via SUBCUTANEOUS
  Filled 2019-04-13 (×5): qty 0.4

## 2019-04-13 MED ORDER — IPRATROPIUM-ALBUTEROL 20-100 MCG/ACT IN AERS
1.0000 | INHALATION_SPRAY | Freq: Four times a day (QID) | RESPIRATORY_TRACT | Status: DC
  Administered 2019-04-13 – 2019-04-18 (×21): 1 via RESPIRATORY_TRACT
  Filled 2019-04-13 (×2): qty 4

## 2019-04-13 MED ORDER — DEXAMETHASONE SODIUM PHOSPHATE 10 MG/ML IJ SOLN
6.0000 mg | Freq: Once | INTRAMUSCULAR | Status: AC
  Administered 2019-04-13: 6 mg via INTRAVENOUS
  Filled 2019-04-13: qty 1

## 2019-04-13 MED ORDER — DEXAMETHASONE SODIUM PHOSPHATE 10 MG/ML IJ SOLN
6.0000 mg | INTRAMUSCULAR | Status: DC
  Administered 2019-04-14 – 2019-04-17 (×5): 6 mg via INTRAVENOUS
  Filled 2019-04-13 (×6): qty 1

## 2019-04-13 MED ORDER — ATORVASTATIN CALCIUM 40 MG PO TABS
40.0000 mg | ORAL_TABLET | Freq: Every day | ORAL | Status: DC
  Administered 2019-04-13 – 2019-04-18 (×6): 40 mg via ORAL
  Filled 2019-04-13 (×7): qty 1

## 2019-04-13 MED ORDER — ALBUTEROL SULFATE HFA 108 (90 BASE) MCG/ACT IN AERS: 2.0000 | INHALATION_SPRAY | Freq: Once | RESPIRATORY_TRACT | Status: DC | PRN

## 2019-04-13 MED ORDER — SODIUM CHLORIDE 0.9 % IV SOLN: 700.0000 mg | Freq: Once | INTRAVENOUS | Status: DC

## 2019-04-13 MED ORDER — DIPHENHYDRAMINE HCL 50 MG/ML IJ SOLN: 50.0000 mg | Freq: Once | INTRAMUSCULAR | Status: DC | PRN

## 2019-04-13 MED ORDER — METHYLPREDNISOLONE SODIUM SUCC 125 MG IJ SOLR: 125.0000 mg | Freq: Once | INTRAMUSCULAR | Status: DC | PRN

## 2019-04-13 MED ORDER — POLYETHYLENE GLYCOL 3350 17 G PO PACK: 17.0000 g | PACK | Freq: Every day | ORAL | Status: DC | PRN

## 2019-04-13 MED ORDER — ACETAMINOPHEN 325 MG PO TABS: 650.0000 mg | ORAL_TABLET | Freq: Four times a day (QID) | ORAL | Status: DC | PRN

## 2019-04-13 MED ORDER — HYDROCHLOROTHIAZIDE 25 MG PO TABS
25.0000 mg | ORAL_TABLET | Freq: Every day | ORAL | Status: DC
  Administered 2019-04-13 – 2019-04-14 (×2): 25 mg via ORAL
  Filled 2019-04-13 (×2): qty 1

## 2019-04-13 MED ORDER — ASCORBIC ACID 500 MG PO TABS
500.0000 mg | ORAL_TABLET | Freq: Every day | ORAL | Status: DC
  Administered 2019-04-13 – 2019-04-18 (×6): 500 mg via ORAL
  Filled 2019-04-13 (×6): qty 1

## 2019-04-13 MED ORDER — SODIUM CHLORIDE 0.9 % IV SOLN
100.0000 mg | Freq: Every day | INTRAVENOUS | Status: AC
  Administered 2019-04-14 – 2019-04-17 (×4): 100 mg via INTRAVENOUS
  Filled 2019-04-13 (×4): qty 20

## 2019-04-13 MED ORDER — ACETAMINOPHEN 650 MG RE SUPP: 650.0000 mg | Freq: Four times a day (QID) | RECTAL | Status: DC | PRN

## 2019-04-13 MED ORDER — POTASSIUM CHLORIDE CRYS ER 20 MEQ PO TBCR
40.0000 meq | EXTENDED_RELEASE_TABLET | Freq: Once | ORAL | Status: AC
  Administered 2019-04-13: 40 meq via ORAL
  Filled 2019-04-13: qty 2

## 2019-04-13 MED ORDER — ONDANSETRON HCL 4 MG/2ML IJ SOLN
4.0000 mg | Freq: Four times a day (QID) | INTRAMUSCULAR | Status: DC | PRN
  Administered 2019-04-18: 4 mg via INTRAVENOUS
  Filled 2019-04-13: qty 2

## 2019-04-13 MED ORDER — SODIUM CHLORIDE 0.9 % IV SOLN
200.0000 mg | Freq: Once | INTRAVENOUS | Status: AC
  Administered 2019-04-13: 200 mg via INTRAVENOUS
  Filled 2019-04-13: qty 200

## 2019-04-13 MED ORDER — ONDANSETRON HCL 4 MG PO TABS: 4.0000 mg | ORAL_TABLET | Freq: Four times a day (QID) | ORAL | Status: DC | PRN

## 2019-04-13 MED ORDER — FLECAINIDE ACETATE 50 MG PO TABS
75.0000 mg | ORAL_TABLET | Freq: Two times a day (BID) | ORAL | Status: DC
  Administered 2019-04-13 – 2019-04-18 (×10): 75 mg via ORAL
  Filled 2019-04-13 (×12): qty 2

## 2019-04-13 MED ORDER — GUAIFENESIN-DM 100-10 MG/5ML PO SYRP: 10.0000 mL | ORAL_SOLUTION | ORAL | Status: DC | PRN

## 2019-04-13 MED ORDER — ASPIRIN 81 MG PO CHEW
81.0000 mg | CHEWABLE_TABLET | Freq: Every day | ORAL | Status: DC
  Administered 2019-04-13 – 2019-04-18 (×6): 81 mg via ORAL
  Filled 2019-04-13 (×6): qty 1

## 2019-04-13 MED ORDER — SODIUM CHLORIDE 0.9 % IV SOLN: INTRAVENOUS | Status: DC | PRN

## 2019-04-13 MED ORDER — FAMOTIDINE IN NACL 20-0.9 MG/50ML-% IV SOLN: 20.0000 mg | Freq: Once | INTRAVENOUS | Status: DC | PRN

## 2019-04-13 MED ORDER — METOPROLOL SUCCINATE ER 25 MG PO TB24
50.0000 mg | ORAL_TABLET | Freq: Every day | ORAL | Status: DC
  Administered 2019-04-13 – 2019-04-18 (×6): 50 mg via ORAL
  Filled 2019-04-13 (×2): qty 1
  Filled 2019-04-13: qty 2
  Filled 2019-04-13: qty 1
  Filled 2019-04-13: qty 2
  Filled 2019-04-13 (×2): qty 1

## 2019-04-13 NOTE — ED Provider Notes (Signed)
[name] Provider Note   CSN: [id number] Arrival date & time: 04/13/19  4270     History Chief Complaint  Patient presents with  . low oxygen saturation  . COVID positive (+)    [name] is a 59 y.o. female.  Patient diagnosed with Covid 5 days ago.  She was seen at the infusion center this morning to get antibiotic infusion.  She was sent here instead with hypoxia and shortness of breath.  She reports O2 saturation was in the 70s.  EMS brought her in on 3 L saturating 96%.  She was seen in the ED on January 22 and feels that she is more short of breath and coughing over the past several days.  She has had intermittent fever and chills at home with nausea but no vomiting.  No chest pain.  Has had several episodes of diarrhea.  Multiple sick contacts at home.  No pain with urination or blood in the urine. Feels like she is breathing at baseline now.  Does not have any history of cardiac or pulmonary problems.  The history is provided by the patient and the EMS personnel.       Past Medical History:  Diagnosis Date  . Hyperlipidemia   . Hypertension   . Palpitations   . PVC (premature ventricular contraction)     Patient Active Problem List   Diagnosis Date Noted  . Primary osteoarthritis of first carpometacarpal joint of left hand 05/03/2015  . Radial styloid tenosynovitis 05/03/2015  . PVC (premature ventricular contraction) 02/05/2014  . Benign essential HTN 02/05/2014  . Dyslipidemia 02/05/2014    Past Surgical History:  Procedure Laterality Date  . APPENDECTOMY    . carpal tunn    . CESAREAN SECTION    . NEUROMA SURGERY    . SEPTOPLASTY    . TONSILLECTOMY    . TOTAL ABDOMINAL HYSTERECTOMY W/ BILATERAL SALPINGOOPHORECTOMY    . TUBAL LIGATION       OB History   No obstetric history on file.     Family History  Problem Relation Age of Onset  . Hyperlipidemia Sister   . Hyperlipidemia Brother     Social  History   Tobacco Use  . Smoking status: Never Smoker  . Smokeless tobacco: Never Used  Substance Use Topics  . Alcohol use: Yes    Alcohol/week: 0.0 standard drinks    Comment: occsaionl wine  . Drug use: Never    Home Medications Prior to Admission medications   Medication Sig Start Date End Date Taking? Authorizing Provider  amoxicillin-clavulanate (AUGMENTIN) 875-125 MG tablet Take 1 tablet by mouth 2 (two) times daily. 04/03/19   [provider]  aspirin 81 MG chewable tablet Chew 81 mg by mouth daily.     [provider]  atorvastatin (LIPITOR) 40 MG tablet Take 1 tablet by mouth once daily 01/13/19   Quintella Reichert, MD  cholecalciferol (VITAMIN D) 1000 UNITS tablet Take 1,000 Units by mouth daily.    [provider]  estradiol (VIVELLE-DOT) 0.05 MG/24HR patch Place 1 patch onto the skin 2 (two) times a week.  01/08/14   [provider]  flecainide (TAMBOCOR) 150 MG tablet Take 0.5 tablets (75 mg total) by mouth 2 (two) times daily. 11/10/18   Marinus Maw, MD  fluconazole (DIFLUCAN) 150 MG tablet Take 150 mg by mouth once a week. 04/03/19   [provider]  hydrochlorothiazide (HYDRODIURIL) 25 MG tablet Take 25 mg  by mouth daily.  11/03/13   [provider]  metoprolol succinate (TOPROL-XL) 50 MG 24 hr tablet Take 1 tablet (50 mg total) by mouth daily. Take with or immediately following a meal. 11/10/18 11/10/19  Evans Lance, MD  potassium chloride 20 MEQ TBCR Take 20 mEq by mouth daily. 02/08/14   Sueanne Margarita, MD  PROAIR HFA 108 [phone] Base) MCG/ACT inhaler Inhale 2 puffs into the lungs as needed for shortness of breath. 01/30/18   [provider]  promethazine-codeine (PHENERGAN WITH CODEINE) 6.25-10 MG/5ML syrup Take 5 mLs by mouth every 6 (six) hours as needed for cough.    [provider]    Allergies    Buprenorphine hcl, Morphine and related, Other, and Short ragweed pollen ext  Review of Systems    Review of Systems  Constitutional: Positive for activity change, appetite change, fatigue and fever.  HENT: Negative for congestion and rhinorrhea.   Eyes: Negative for visual disturbance.  Respiratory: Positive for cough, chest tightness and shortness of breath.   Cardiovascular: Negative for chest pain.  Gastrointestinal: Negative for abdominal pain, nausea and vomiting.  Genitourinary: Negative for dysuria and hematuria.  Musculoskeletal: Positive for arthralgias and myalgias.  Skin: Negative for rash.  Neurological: Positive for weakness and headaches.   all other systems are negative except as noted in the HPI and PMH.    Physical Exam Updated Vital Signs BP (!) 146/65   Pulse 89   Temp 100.1 F (37.8 C) (Oral)   Resp (!) 24   Ht 5\' 7"  (1.702 m)   Wt 98.9 kg   SpO2 96%   BMI 34.14 kg/m   Physical Exam Vitals and nursing note reviewed.  Constitutional:      General: She is not in acute distress.    Appearance: She is well-developed.     Comments: Speaking full sentences, no distress  HENT:     Head: Normocephalic and atraumatic.     Mouth/Throat:     Pharynx: No oropharyngeal exudate.  Eyes:     Conjunctiva/sclera: Conjunctivae normal.     Pupils: Pupils are equal, round, and reactive to light.  Neck:     Comments: No meningismus. Cardiovascular:     Rate and Rhythm: Normal rate and regular rhythm.     Heart sounds: Normal heart sounds. No murmur.  Pulmonary:     Effort: Pulmonary effort is normal. No respiratory distress.     Breath sounds: Normal breath sounds.  Chest:     Chest wall: No tenderness.  Abdominal:     Palpations: Abdomen is soft.     Tenderness: There is no abdominal tenderness. There is no guarding or rebound.  Musculoskeletal:        General: No tenderness. Normal range of motion.     Cervical back: Normal range of motion and neck supple.  Skin:    General: Skin is warm.     Capillary Refill: Capillary refill takes less than 2 seconds.   Neurological:     General: No focal deficit present.     Mental Status: She is alert and oriented to person, place, and time. Mental status is at baseline.     Cranial Nerves: No cranial nerve deficit.     Motor: No abnormal muscle tone.     Coordination: Coordination normal.     Comments:  5/5 strength throughout. CN 2-12 intact.Equal grip strength.   Psychiatric:        Behavior: Behavior normal.  ED Results / Procedures / Treatments   Labs (all labs ordered are listed, but only abnormal results are displayed) Labs Reviewed  CBC WITH DIFFERENTIAL/PLATELET - Abnormal; Notable for the following components:      Result Value   Neutro Abs 9.1 (*)    Abs Immature Granulocytes 0.12 (*)    All other components within normal limits  COMPREHENSIVE METABOLIC PANEL - Abnormal; Notable for the following components:   Potassium 3.2 (*)    Glucose, Bld 139 (*)    Albumin 3.3 (*)    AST 44 (*)    All other components within normal limits  D-DIMER, QUANTITATIVE (NOT AT [facility]) - Abnormal; Notable for the following components:   D-Dimer, Quant 2.20 (*)    All other components within normal limits  LACTATE DEHYDROGENASE - Abnormal; Notable for the following components:   LDH 431 (*)    All other components within normal limits  FIBRINOGEN - Abnormal; Notable for the following components:   Fibrinogen 623 (*)    All other components within normal limits  CULTURE, BLOOD (ROUTINE X 2)  CULTURE, BLOOD (ROUTINE X 2)  TRIGLYCERIDES  LACTIC ACID, PLASMA  LACTIC ACID, PLASMA  PROCALCITONIN  FERRITIN  C-REACTIVE PROTEIN  I-STAT BETA HCG BLOOD, ED ([facility], [facility], [facility] ONLY)    EKG EKG Interpretation  Date/Time:  Monday April 13 2019 10:29:32 EST Ventricular Rate:  89 PR Interval:    QRS Duration: 89 QT Interval:  355 QTC Calculation: 432 R Axis:   41 Text Interpretation: Sinus rhythm Borderline T abnormalities, diffuse leads No significant change was found Confirmed by Glynn Octave  [phone]) on 04/13/2019 11:33:36 AM   Radiology DG Chest Port 1 View  Result Date: 04/13/2019 CLINICAL DATA:  Shortness of breath.  Weakness.  COVID-19 positive. EXAM: PORTABLE CHEST 1 VIEW COMPARISON:  04/09/2019 FINDINGS: Patient rotated right. Normal heart size. No pleural effusion or pneumothorax. Progressive lower lobe predominant interstitial opacities, slightly greater right than left. Lung volumes are mildly low. IMPRESSION: Worsened aeration with progressive bilateral interstitial opacities, most consistent with COVID-19 pneumonia. Electronically Signed   By: Jeronimo Greaves M.D.   On: 04/13/2019 10:30    Procedures .Critical Care Performed by: Glynn Octave, MD Authorized by: Glynn Octave, MD   Critical care provider statement:    Critical care time (minutes):  35   Critical care was necessary to treat or prevent imminent or life-threatening deterioration of the following conditions:  Respiratory failure   Critical care was time spent personally by me on the following activities:  Discussions with consultants, evaluation of patient's response to treatment, examination of patient, ordering and performing treatments and interventions, ordering and review of laboratory studies, ordering and review of radiographic studies, pulse oximetry, re-evaluation of patient's condition, obtaining history from patient or surrogate and review of old charts   (including critical care time)  Medications Ordered in ED Medications - No data to display  ED Course  I have reviewed the triage vital signs and the nursing notes.  Pertinent labs & imaging results that were available during my care of the patient were reviewed by me and considered in my medical decision making (see chart for details).    MDM Rules/Calculators/A&P                      Known Covid positive sent from [facility] clinic with hypoxia and increased shortness of breath.  Patient with new oxygen requirement and tachypnea.   Her chest x-ray  shows progression of her bilateral interstitial infiltrates.  She started on IV Decadron and remdesivir.  Labs are sent. There is slight elevation of white blood cell count and inflammatory markers. D-dimer 2.2.  Favor coronavirus causing her acute hypoxic respiratory failure.  Low suspicion for pulmonary embolism.  She will need admission.  D/w Dr. [place].    Noora Aviva Wolfer was evaluated in Emergency Department on 04/13/2019 for the symptoms described in the history of present illness. She was evaluated in the context of the global COVID-19 pandemic, which necessitated consideration that the patient might be at risk for infection with the SARS-CoV-2 virus that causes COVID-19. Institutional protocols and algorithms that pertain to the evaluation of patients at risk for COVID-19 are in a state of rapid change based on information released by regulatory bodies including the CDC and federal and state organizations. These policies and algorithms were followed during the patient's care in the ED.  Final Clinical Impression(s) / ED Diagnoses Final diagnoses:  Acute respiratory failure with hypoxia (HCC)  COVID-19 virus infection    Rx / DC Orders ED Discharge Orders    None       Evan Osburn, Jeannett Senior, MD 04/13/19 1725

## 2019-04-13 NOTE — Plan of Care (Signed)
  Problem: Nutrition: Goal: Adequate nutrition will be maintained Outcome: Progressing   

## 2019-04-13 NOTE — Plan of Care (Signed)
POC initiated 

## 2019-04-13 NOTE — ED Notes (Signed)
Patient assisted to bedside commode, urine sample and culture sent down to lab. While transferring, patient required no assistance, oxygen dropped to 85% on 3L [place] during transfer, holding steady while in bed/resting.

## 2019-04-13 NOTE — H&P (Signed)
History and Physical    [name] MGQ:[id number] DOB: 1960/10/18 DOA: 04/13/2019  PCP: Shirlean Mylar, MD  Patient coming from: Home/[facility] infusion center  I have personally briefly reviewed patient's old medical records in [facility]  Chief Complaint: Shortness of breath, nausea, diarrhea  HPI: [name] is a 59 y.o. female with medical history significant of essential hypertension, frequent PVCs, HLD who presents from the [facility] infusion center after being found hypoxic, oxygenating 70-80% on room air.  Patient with positive Covid-19 infection on 04/10/2019; currently receiving bamlanivimab infusion.  Patient reports awoke this morning with significant dyspnea.  She also reports loss of taste/smell over the last few days; and diarrhea since last Wednesday.  She also endorses poor appetite.  No other specific complaints at this time.  Denies headache, no chest pain, palpitations, no abdominal pain.  ED Course: Temperature 100.1, HR 89, RR 24, BP 146/65, SPO2 96% on 4 L nasal cannula (70-80's per EMS on room air), the [facility] count 10.4, hemoglobin 12.5, platelets 361.  Sodium 139, potassium 3.2, chloride 101, CO2 26, BUN 11, creatinine 0.80, glucose 131.  LDH 431.  hCG less than 5.0.  D-dimer 2.20.  Chest x-ray with findings consistent of multifocal pneumonia.  CRP, procalcitonin, and ferritin pending at time of admission.  Patient was given remdesivir and 6 mg IV Decadron.  ED physician referred patient for admission for persistent hypoxia in the setting of Covid-19 pneumonia for further evaluation and treatment.  Review of Systems: As per HPI otherwise 10 point review of systems negative.    Past Medical History:  Diagnosis Date  . Hyperlipidemia   . Hypertension   . Palpitations   . PVC (premature ventricular contraction)     Past Surgical History:  Procedure Laterality Date  . APPENDECTOMY    . carpal tunn    . CESAREAN SECTION    . NEUROMA SURGERY    . SEPTOPLASTY     . TONSILLECTOMY    . TOTAL ABDOMINAL HYSTERECTOMY W/ BILATERAL SALPINGOOPHORECTOMY    . TUBAL LIGATION       reports that she has never smoked. She has never used smokeless tobacco. She reports current alcohol use. She reports that she does not use drugs.  Allergies  Allergen Reactions  . Buprenorphine Hcl Itching  . Morphine And Related Itching  . Other   . Short Ragweed Pollen Ext Hives    Family History  Problem Relation Age of Onset  . Hyperlipidemia Sister   . Hyperlipidemia Brother     Family history reviewed and not pertinent   Prior to Admission medications   Medication Sig Start Date End Date Taking? Authorizing Provider  amoxicillin-clavulanate (AUGMENTIN) 875-125 MG tablet Take 1 tablet by mouth 2 (two) times daily. 04/03/19   [provider]  aspirin 81 MG chewable tablet Chew 81 mg by mouth daily.     [provider]  atorvastatin (LIPITOR) 40 MG tablet Take 1 tablet by mouth once daily 01/13/19   Quintella Reichert, MD  cholecalciferol (VITAMIN D) 1000 UNITS tablet Take 1,000 Units by mouth daily.    [provider]  estradiol (VIVELLE-DOT) 0.05 MG/24HR patch Place 1 patch onto the skin 2 (two) times a week.  01/08/14   [provider]  flecainide (TAMBOCOR) 150 MG tablet Take 0.5 tablets (75 mg total) by mouth 2 (two) times daily. 11/10/18   Marinus Maw, MD  fluconazole (DIFLUCAN) 150 MG tablet Take 150 mg by mouth once a  week. 04/03/19   [provider]  hydrochlorothiazide (HYDRODIURIL) 25 MG tablet Take 25 mg by mouth daily.  11/03/13   [provider]  metoprolol succinate (TOPROL-XL) 50 MG 24 hr tablet Take 1 tablet (50 mg total) by mouth daily. Take with or immediately following a meal. 11/10/18 11/10/19  Evans Lance, MD  potassium chloride 20 MEQ TBCR Take 20 mEq by mouth daily. 02/08/14   Sueanne Margarita, MD  PROAIR HFA 108 [phone] Base) MCG/ACT inhaler Inhale 2 puffs into the lungs as needed for shortness of  breath. 01/30/18   [provider]  promethazine-codeine (PHENERGAN WITH CODEINE) 6.25-10 MG/5ML syrup Take 5 mLs by mouth every 6 (six) hours as needed for cough.    [provider]    Physical Exam: Vitals:   04/13/19 0950 04/13/19 1000 04/13/19 1124 04/13/19 1132  BP:  (!) 136/59  130/65  Pulse:  84 86 91  Resp:  (!) 26 (!) 28 (!) 35  Temp:      TempSrc:      SpO2:  93% 98% 94%  Weight: 98.9 kg     Height: 5\' 7"  (1.702 m)       Constitutional: NAD, calm, slight respiratory distress Eyes: PERRL, lids and conjunctivae normal ENMT: Mucous membranes are dry. Posterior pharynx clear of any exudate or lesions.Normal dentition.  Neck: normal, supple, no masses, no thyromegaly Respiratory: clear to auscultation bilaterally, no wheezing, no crackles.  Slightly increased respiratory effort. No accessory muscle use.  Oxygenating well on 2.5 L nasal cannula Cardiovascular: Regular rate and rhythm, no murmurs / rubs / gallops. No extremity edema. 2+ pedal pulses. No carotid bruits.  Abdomen: no tenderness, no masses palpated. No hepatosplenomegaly. Bowel sounds positive.  Musculoskeletal: no clubbing / cyanosis. No joint deformity upper and lower extremities. Good ROM, no contractures. Normal muscle tone.  Skin: no rashes, lesions, ulcers. No induration Neurologic: CN 2-12 grossly intact. Sensation intact, DTR normal. Strength 5/5 in all 4.  Psychiatric: Normal judgment and insight. Alert and oriented x 3. Normal mood.    Labs on Admission: I have personally reviewed following labs and imaging studies  CBC: Recent Labs  Lab 04/10/19 1338 04/13/19 1050  WBC 6.6 10.4  NEUTROABS 5.3 9.1*  HGB 12.1 12.5  HCT 37.1 39.6  MCV 92.5 93.4  PLT 238 628   Basic Metabolic Panel: Recent Labs  Lab 04/10/19 1338 04/13/19 1050  NA 134* 139  K 3.6 3.2*  CL 96* 101  CO2 29 26  GLUCOSE 128* 139*  BUN 9 11  CREATININE 0.92 0.80  CALCIUM 8.7* 8.9   GFR: Estimated  Creatinine Clearance: 91.4 mL/min (by C-G formula based on SCr of 0.8 mg/dL). Liver Function Tests: Recent Labs  Lab 04/10/19 1338 04/13/19 1050  AST 36 44*  ALT 25 32  ALKPHOS 39 45  BILITOT 1.1 1.2  PROT 7.1 7.2  ALBUMIN 3.5 3.3*   No results for input(s): LIPASE, AMYLASE in the last 168 hours. No results for input(s): AMMONIA in the last 168 hours. Coagulation Profile: No results for input(s): INR, PROTIME in the last 168 hours. Cardiac Enzymes: No results for input(s): CKTOTAL, CKMB, CKMBINDEX, TROPONINI in the last 168 hours. BNP (last 3 results) No results for input(s): PROBNP in the last 8760 hours. HbA1C: No results for input(s): HGBA1C in the last 72 hours. CBG: No results for input(s): GLUCAP in the last 168 hours. Lipid Profile: Recent Labs    04/13/19 1050  TRIG 94  Thyroid Function Tests: No results for input(s): TSH, T4TOTAL, FREET4, T3FREE, THYROIDAB in the last 72 hours. Anemia Panel: No results for input(s): VITAMINB12, FOLATE, FERRITIN, TIBC, IRON, RETICCTPCT in the last 72 hours. Urine analysis: No results found for: COLORURINE, APPEARANCEUR, LABSPEC, PHURINE, GLUCOSEU, HGBUR, BILIRUBINUR, KETONESUR, PROTEINUR, UROBILINOGEN, NITRITE, LEUKOCYTESUR  Radiological Exams on Admission: DG Chest Port 1 View  Result Date: 04/13/2019 CLINICAL DATA:  Shortness of breath.  Weakness.  COVID-19 positive. EXAM: PORTABLE CHEST 1 VIEW COMPARISON:  04/09/2019 FINDINGS: Patient rotated right. Normal heart size. No pleural effusion or pneumothorax. Progressive lower lobe predominant interstitial opacities, slightly greater right than left. Lung volumes are mildly low. IMPRESSION: Worsened aeration with progressive bilateral interstitial opacities, most consistent with COVID-19 pneumonia. Electronically Signed   By: Jeronimo Greaves M.D.   On: 04/13/2019 10:30    EKG: Independently reviewed.   Assessment/Plan Principal Problem:   Pneumonia due to COVID-19 virus Active  Problems:   PVC (premature ventricular contraction)   Benign essential HTN   Dyslipidemia   Hypokalemia   Acute hypoxic respiratory failure secondary to acute Covid-19 viral pneumonia during the ongoing Covid 19 Pandemic - POA Patient presenting from the [facility] infusion center after being found hypoxic oxygenating between 70s-80s on room air.  Patient with known Covid-19 infection, initially diagnosed on 04/10/2019.  Has been started on bamlanivimab infusion outpatient.  Patient initially requiring 4 L nasal cannula, now titrated down to 2.5 L.  The BC count of 10.4, elevated LDH, D-dimer 2.20.  Chest x-ray consistent with multifocal pneumonia. --Admit inpatient, telemetry --Remdesivir, plan 5-day course --Decadron 6 mg IV daily --Combivent MDI q6h --Continue supplemental oxygen, titrate to maintain SPO2 greater than 92% --Continue supportive care with vitamin C, zinc, Tylenol --Follow CBC, CMP, D-dimer, ferritin, and CRP daily --Continue airborne/contact isolation precautions  Hypokalemia Potassium 3.2.  Will replete. --Repeat electrolytes in a.m. to include magnesium  Essential hypertension BP 146/65. --Continue home metoprolol and HCTZ --Continue aspirin and statin  Hx PVCs Follows with cardiology outpatient. --Continue metoprolol and flecainide --Monitor on telemetry  HLD: Continue statin    DVT prophylaxis: Lovenox Code Status: Full code Family Communication: Updated patient spouse via telephone, regarding plan of care and admission Disposition Plan: Anticipate discharge home with husband once oxygen has been titrated off Consults called: None Admission status: Inpatient, telemetry   Severity of Illness: The appropriate patient status for this patient is INPATIENT. Inpatient status is judged to be reasonable and necessary in order to provide the required intensity of service to ensure the patient's safety. The patient's presenting symptoms, physical exam findings, and  initial radiographic and laboratory data in the context of their chronic comorbidities is felt to place them at high risk for further clinical deterioration. Furthermore, it is not anticipated that the patient will be medically stable for discharge from the hospital within 2 midnights of admission. The following factors support the patient status of inpatient.   " The patient's presenting symptoms include hypoxia, tachypnea, dyspnea, weakness, fatigue " The worrisome physical exam findings include hypoxia, tachypnea " The initial radiographic and laboratory data are worrisome because of elevated D-dimer, hypokalemia, elevated inflammatory markers " The chronic co-morbidities include hypertension, hyperlipidemia, history of frequent PVCs.   * I certify that at the point of admission it is my clinical judgment that the patient will require inpatient hospital care spanning beyond 2 midnights from the point of admission due to high intensity of service, high risk for further deterioration and high frequency of surveillance required.*  Alvira Philips [place] DO [facility] Available via Epic secure chat 7am-7pm After these hours, please refer to coverage provider listed on [email or web address] 04/13/2019, 12:02 PM

## 2019-04-13 NOTE — Progress Notes (Signed)
Notified floor cvg of pt's resp and 02 sats

## 2019-04-13 NOTE — ED Triage Notes (Signed)
Patient tested COVID positive on 1/20, was over at [facility] Infusion center and they found her oxygen to be low 80s RA, EMS brought her in on 3L, sating 96%.

## 2019-04-13 NOTE — ED Notes (Signed)
Report given to Amy, RN.

## 2019-04-13 NOTE — Progress Notes (Signed)
[facility] arrived at this time.

## 2019-04-13 NOTE — Progress Notes (Addendum)
Pt arrived to clinic via wheelchair and placed in treatment room. Pt oxygen saturation on RA 79-82%. Pt states that she has increasing in SOB over last few days. Does not wear oxygen at home. Did not use inhalers prior to arrival. Pt reports that she felt near syncopal at home during shower yesterday-assisted to floor by family-denies injury. Placed on 4L [place] by this RN, oxygen up to 95% at this time. Alert and oriented X 4.  Vitals as followed [id number]: 99.40F oral, 93HR, 129/63(83) right arm, automatic, sitting, 93% on 4L [place], 24RR shallow and even.  EMS called by Eugenie Norrie, RN in infusion clinic.

## 2019-04-14 DIAGNOSIS — I493 Ventricular premature depolarization: Secondary | ICD-10-CM

## 2019-04-14 DIAGNOSIS — I10 Essential (primary) hypertension: Secondary | ICD-10-CM

## 2019-04-14 DIAGNOSIS — J9601 Acute respiratory failure with hypoxia: Secondary | ICD-10-CM

## 2019-04-14 DIAGNOSIS — E876 Hypokalemia: Secondary | ICD-10-CM

## 2019-04-14 LAB — CBC WITH DIFFERENTIAL/PLATELET
Abs Immature Granulocytes: 0.11 10*3/uL — ABNORMAL HIGH (ref 0.00–0.07)
Basophils Absolute: 0 10*3/uL (ref 0.0–0.1)
Basophils Relative: 0 %
Eosinophils Absolute: 0 10*3/uL (ref 0.0–0.5)
Eosinophils Relative: 0 %
HCT: 36.9 % (ref 36.0–46.0)
Hemoglobin: 11.5 g/dL — ABNORMAL LOW (ref 12.0–15.0)
Immature Granulocytes: 1 %
Lymphocytes Relative: 14 %
Lymphs Abs: 1.2 10*3/uL (ref 0.7–4.0)
MCH: 29.3 pg (ref 26.0–34.0)
MCHC: 31.2 g/dL (ref 30.0–36.0)
MCV: 94.1 fL (ref 80.0–100.0)
Monocytes Absolute: 0.3 10*3/uL (ref 0.1–1.0)
Monocytes Relative: 3 %
Neutro Abs: 7.2 10*3/uL (ref 1.7–7.7)
Neutrophils Relative %: 82 %
Platelets: 396 10*3/uL (ref 150–400)
RBC: 3.92 MIL/uL (ref 3.87–5.11)
RDW: 13.1 % (ref 11.5–15.5)
WBC: 8.8 10*3/uL (ref 4.0–10.5)
nRBC: 0 % (ref 0.0–0.2)

## 2019-04-14 LAB — COMPREHENSIVE METABOLIC PANEL
ALT: 25 U/L (ref 0–44)
AST: 34 U/L (ref 15–41)
Albumin: 2.9 g/dL — ABNORMAL LOW (ref 3.5–5.0)
Alkaline Phosphatase: 41 U/L (ref 38–126)
Anion gap: 9 (ref 5–15)
BUN: 12 mg/dL (ref 6–20)
CO2: 25 mmol/L (ref 22–32)
Calcium: 8.5 mg/dL — ABNORMAL LOW (ref 8.9–10.3)
Chloride: 104 mmol/L (ref 98–111)
Creatinine, Ser: 0.74 mg/dL (ref 0.44–1.00)
GFR calc Af Amer: >60 mL/min (ref >60)
GFR calc non Af Amer: >60 mL/min (ref >60)
Glucose, Bld: 169 mg/dL — ABNORMAL HIGH (ref 70–99)
Potassium: 3.6 mmol/L (ref 3.5–5.1)
Sodium: 138 mmol/L (ref 135–145)
Total Bilirubin: 0.8 mg/dL (ref 0.3–1.2)
Total Protein: 6.7 g/dL (ref 6.5–8.1)

## 2019-04-14 LAB — D-DIMER, QUANTITATIVE: D-Dimer, Quant: 1.94 ug/mL-FEU — ABNORMAL HIGH (ref 0.00–0.50)

## 2019-04-14 LAB — C-REACTIVE PROTEIN: CRP: 12 mg/dL — ABNORMAL HIGH (ref <1.0)

## 2019-04-14 MED ORDER — FAMOTIDINE IN NACL 20-0.9 MG/50ML-% IV SOLN: 20.0000 mg | Freq: Once | INTRAVENOUS | Status: DC | PRN

## 2019-04-14 MED ORDER — SODIUM CHLORIDE 0.9 % IV SOLN: 700.0000 mg | Freq: Once | INTRAVENOUS | Status: DC

## 2019-04-14 MED ORDER — METHYLPREDNISOLONE SODIUM SUCC 125 MG IJ SOLR: 125.0000 mg | Freq: Once | INTRAMUSCULAR | Status: DC | PRN

## 2019-04-14 MED ORDER — SODIUM CHLORIDE 0.9 % IV SOLN: INTRAVENOUS | Status: DC | PRN

## 2019-04-14 MED ORDER — EPINEPHRINE 0.3 MG/0.3ML IJ SOAJ: 0.3000 mg | Freq: Once | INTRAMUSCULAR | Status: DC | PRN

## 2019-04-14 MED ORDER — DIPHENHYDRAMINE HCL 50 MG/ML IJ SOLN: 50.0000 mg | Freq: Once | INTRAMUSCULAR | Status: DC | PRN

## 2019-04-14 MED ORDER — ALBUTEROL SULFATE HFA 108 (90 BASE) MCG/ACT IN AERS: 2.0000 | INHALATION_SPRAY | Freq: Once | RESPIRATORY_TRACT | Status: DC | PRN

## 2019-04-14 NOTE — Progress Notes (Signed)
PROGRESS NOTE    [name]   ZOX:[id number]  DOB: 30-Jul-1960  DOA: 04/13/2019 PCP: Maurice Small, MD   Brief Narrative:  [name] is a 59 y.o. female with medical history significant of essential hypertension, frequent PVCs, HLD who presents from the [place] infusion center after being found hypoxic, oxygenating 70-80% on room air.  Patient with positive Covid-19 infection on 04/10/2019 and was at the [facility] infusion center on 1/25 bamlanivimab infusion when she was found to be hypoxic and sent to the ED. She was requiring 3 L of O2. She has had COVID symptoms for at least 1 week now.  She  reports loss of taste/smell, diarrhea and a poor appetite.  She states her daughter has COVID. She does not live with her daughter but she babysits her son.  She was admitted and started on COVID treatment.   Subjective: Feeling slightly better today in regards to overall strength. Still short of breath and has a cough.     Assessment & Plan:   Principal Problem:   Pneumonia due to COVID-19 virus with acute respiratory failure - CXR > Worsened aeration with progressive bilateral interstitial opacities, most consistent with COVID-19 pneumonia. - on 6 L O2 currently - cont Remdesivir and IV Steroids- she completed Bamlaniviimab yesterday - CRP still pending today - Cont Combivent, Zinc and Vit C - diarrhea has improved  Active Problems:  Hypokalemia - replaced  H/o PVCs and Palpitations - on Flecanide - sees Dr Lovena Le - d/c HCTZ permanenty    Benign essential HTN - cont Metoprolol d/c HCTZ    Time spent in minutes: 35 DVT prophylaxis: Lovenox Code Status: Full code Family Communication:  Disposition Plan: home once O2 weaned Consultants:   none Procedures:   none Antimicrobials:  Anti-infectives (From admission, onward)   Start     Dose/Rate Route Frequency Ordered Stop   04/14/19 1000  remdesivir 100 mg in sodium chloride 0.9 % 100 mL IVPB     100 mg  200 mL/hr over 30 Minutes Intravenous Daily 04/13/19 1104 04/18/19 0959   04/13/19 1230  remdesivir 200 mg in sodium chloride 0.9% 250 mL IVPB     200 mg 580 mL/hr over 30 Minutes Intravenous Once 04/13/19 1104 04/13/19 1239       Objective: Vitals:   04/13/19 1640 04/13/19 2038 04/14/19 0034 04/14/19 0514  BP: 129/61 132/69  136/75  Pulse: 83 75  73  Resp:  (!) 26 (!) 22 18  Temp: 98.9 F (37.2 C) 98.7 F (37.1 C)  98.6 F (37 C)  TempSrc: Oral Oral  Oral  SpO2: 90% 93%  95%  Weight:      Height:        Intake/Output Summary (Last 24 hours) at 04/14/2019 1130 Last data filed at 04/14/2019 0935 Gross per 24 hour  Intake 1526.79 ml  Output -  Net 1526.79 ml   Filed Weights   04/13/19 0950  Weight: 98.9 kg    Examination: General exam: Appears comfortable  HEENT: PERRLA, oral mucosa moist, no sclera icterus or thrush Respiratory system: Clear to auscultation. Respiratory effort normal. Cardiovascular system: S1 & S2 heard, RRR.   Gastrointestinal system: Abdomen soft, non-tender, nondistended. Normal bowel sounds. Central nervous system: Alert and oriented. No focal neurological deficits. Extremities: No cyanosis, clubbing or edema Skin: No rashes or ulcers Psychiatry:  Mood & affect appropriate.     Data Reviewed: I have personally reviewed following labs and imaging studies  CBC: Recent  Labs  Lab 04/10/19 1338 04/13/19 1050 04/14/19 0351  WBC 6.6 10.4 8.8  NEUTROABS 5.3 9.1* 7.2  HGB 12.1 12.5 11.5*  HCT 37.1 39.6 36.9  MCV 92.5 93.4 94.1  PLT 238 361 396   Basic Metabolic Panel: Recent Labs  Lab 04/10/19 1338 04/13/19 1050 04/14/19 0351  NA 134* 139 138  K 3.6 3.2* 3.6  CL 96* 101 104  CO2 29 26 25   GLUCOSE 128* 139* 169*  BUN 9 11 12   CREATININE 0.92 0.80 0.74  CALCIUM 8.7* 8.9 8.5*  MG  --  2.0  --    GFR: Estimated Creatinine Clearance: 91.4 mL/min (by C-G formula based on SCr of 0.74 mg/dL). Liver Function Tests: Recent Labs  Lab  04/10/19 1338 04/13/19 1050 04/14/19 0351  AST 36 44* 34  ALT 25 32 25  ALKPHOS 39 45 41  BILITOT 1.1 1.2 0.8  PROT 7.1 7.2 6.7  ALBUMIN 3.5 3.3* 2.9*   No results for input(s): LIPASE, AMYLASE in the last 168 hours. No results for input(s): AMMONIA in the last 168 hours. Coagulation Profile: No results for input(s): INR, PROTIME in the last 168 hours. Cardiac Enzymes: No results for input(s): CKTOTAL, CKMB, CKMBINDEX, TROPONINI in the last 168 hours. BNP (last 3 results) No results for input(s): PROBNP in the last 8760 hours. HbA1C: No results for input(s): HGBA1C in the last 72 hours. CBG: No results for input(s): GLUCAP in the last 168 hours. Lipid Profile: Recent Labs    04/13/19 1050  TRIG 94   Thyroid Function Tests: No results for input(s): TSH, T4TOTAL, FREET4, T3FREE, THYROIDAB in the last 72 hours. Anemia Panel: Recent Labs    04/13/19 1050  FERRITIN 1,561*   Urine analysis: No results found for: COLORURINE, APPEARANCEUR, LABSPEC, PHURINE, GLUCOSEU, HGBUR, BILIRUBINUR, KETONESUR, PROTEINUR, UROBILINOGEN, NITRITE, LEUKOCYTESUR Sepsis Labs: @LABRCNTIP (procalcitonin:4,lacticidven:4) ) Recent Results (from the past 240 hour(s))  Blood Culture (routine x 2)     Status: None (Preliminary result)   Collection Time: 04/13/19 10:50 AM   Specimen: BLOOD  Result Value Ref Range Status   Specimen Description   Final    BLOOD RIGHT ANTECUBITAL Performed at [facility], [address] [place], [place]    Special Requests   Final    BOTTLES DRAWN AEROBIC AND ANAEROBIC Blood Culture adequate volume Performed at [facility], [address], [place], [place]    Culture   Final    NO GROWTH < 24 HOURS Performed at [facility], [address], [place], [facility] [address]    Report Status PENDING  Incomplete  Blood Culture (routine x 2)     Status: None (Preliminary result)   Collection Time: 04/13/19  11:00 AM   Specimen: BLOOD  Result Value Ref Range Status   Specimen Description   Final    BLOOD LEFT ANTECUBITAL Performed at [facility], [address], [place], [place]    Special Requests   Final    BOTTLES DRAWN AEROBIC AND ANAEROBIC Blood Culture adequate volume Performed at [facility], [address], [place], [place]    Culture   Final    NO GROWTH < 24 HOURS Performed at [facility], [address], [place], [facility] [address]    Report Status PENDING  Incomplete         Radiology Studies: DG Chest Port 1 View  Result Date: 04/13/2019 CLINICAL DATA:  Shortness of breath.  Weakness.  COVID-19 positive. EXAM: PORTABLE CHEST 1 VIEW COMPARISON:  04/09/2019 FINDINGS: Patient rotated right. Normal heart size. No pleural effusion or pneumothorax. Progressive lower lobe predominant interstitial opacities, slightly greater right than left. Lung volumes are mildly low. IMPRESSION: Worsened aeration with progressive bilateral interstitial opacities, most consistent with COVID-19 pneumonia. Electronically Signed   By: Jeronimo Greaves M.D.   On: 04/13/2019 10:30      Scheduled Meds: . vitamin C  500 mg Oral Daily  . aspirin  81 mg Oral Daily  . atorvastatin  40 mg Oral Daily  . dexamethasone (DECADRON) injection  6 mg Intravenous Q24H  . enoxaparin (LOVENOX) injection  40 mg Subcutaneous Q24H  . flecainide  75 mg Oral BID  . hydrochlorothiazide  25 mg Oral Daily  . Ipratropium-Albuterol  1 puff Inhalation Q6H  . metoprolol succinate  50 mg Oral Daily  . zinc sulfate  220 mg Oral Daily   Continuous Infusions: . 0.9 % NaCl with KCl 20 mEq / L 75 mL/hr at 04/14/19 0508  . remdesivir 100 mg in NS 100 mL 100 mg (04/14/19 1122)     LOS: 1 day      Calvert Cantor, MD [facility] Pager: [email or web address] Password [email or web address] 04/14/2019, 11:30 AM

## 2019-04-14 NOTE — Progress Notes (Signed)
Pt awake for am VS and bath.  She had maintained 02 sats 88-92% during the night on 5l  After dropping to 82%, High flow initiated at 6l Pt's 02 sats back up to 88-90%

## 2019-04-14 NOTE — Plan of Care (Signed)
°  Problem: Clinical Measurements: °Goal: Ability to maintain clinical measurements within normal limits will improve °Outcome: Progressing °Goal: Will remain free from infection °Outcome: Progressing °Goal: Diagnostic test results will improve °Outcome: Progressing °Goal: Respiratory complications will improve °Outcome: Progressing °  °

## 2019-04-15 DIAGNOSIS — E785 Hyperlipidemia, unspecified: Secondary | ICD-10-CM

## 2019-04-15 LAB — CBC WITH DIFFERENTIAL/PLATELET
Abs Immature Granulocytes: 0.17 10*3/uL — ABNORMAL HIGH (ref 0.00–0.07)
Basophils Absolute: 0 10*3/uL (ref 0.0–0.1)
Basophils Relative: 0 %
Eosinophils Absolute: 0 10*3/uL (ref 0.0–0.5)
Eosinophils Relative: 0 %
HCT: 36.5 % (ref 36.0–46.0)
Hemoglobin: 11.6 g/dL — ABNORMAL LOW (ref 12.0–15.0)
Immature Granulocytes: 1 %
Lymphocytes Relative: 13 %
Lymphs Abs: 1.8 10*3/uL (ref 0.7–4.0)
MCH: 29.6 pg (ref 26.0–34.0)
MCHC: 31.8 g/dL (ref 30.0–36.0)
MCV: 93.1 fL (ref 80.0–100.0)
Monocytes Absolute: 0.8 10*3/uL (ref 0.1–1.0)
Monocytes Relative: 6 %
Neutro Abs: 10.5 10*3/uL — ABNORMAL HIGH (ref 1.7–7.7)
Neutrophils Relative %: 80 %
Platelets: 400 10*3/uL (ref 150–400)
RBC: 3.92 MIL/uL (ref 3.87–5.11)
RDW: 13 % (ref 11.5–15.5)
WBC: 13.3 10*3/uL — ABNORMAL HIGH (ref 4.0–10.5)
nRBC: 0.2 % (ref 0.0–0.2)

## 2019-04-15 LAB — C-REACTIVE PROTEIN: CRP: 6.1 mg/dL — ABNORMAL HIGH (ref <1.0)

## 2019-04-15 LAB — COMPREHENSIVE METABOLIC PANEL
ALT: 25 U/L (ref 0–44)
AST: 31 U/L (ref 15–41)
Albumin: 3 g/dL — ABNORMAL LOW (ref 3.5–5.0)
Alkaline Phosphatase: 45 U/L (ref 38–126)
Anion gap: 7 (ref 5–15)
BUN: 14 mg/dL (ref 6–20)
CO2: 25 mmol/L (ref 22–32)
Calcium: 8.7 mg/dL — ABNORMAL LOW (ref 8.9–10.3)
Chloride: 106 mmol/L (ref 98–111)
Creatinine, Ser: 0.7 mg/dL (ref 0.44–1.00)
GFR calc Af Amer: >60 mL/min (ref >60)
GFR calc non Af Amer: >60 mL/min (ref >60)
Glucose, Bld: 157 mg/dL — ABNORMAL HIGH (ref 70–99)
Potassium: 3.8 mmol/L (ref 3.5–5.1)
Sodium: 138 mmol/L (ref 135–145)
Total Bilirubin: 1 mg/dL (ref 0.3–1.2)
Total Protein: 6.5 g/dL (ref 6.5–8.1)

## 2019-04-15 LAB — D-DIMER, QUANTITATIVE: D-Dimer, Quant: 2.04 ug/mL-FEU — ABNORMAL HIGH (ref 0.00–0.50)

## 2019-04-15 MED ORDER — TOCILIZUMAB 400 MG/20ML IV SOLN
800.0000 mg | Freq: Once | INTRAVENOUS | Status: AC
  Administered 2019-04-15: 800 mg via INTRAVENOUS
  Filled 2019-04-15: qty 40

## 2019-04-15 NOTE — Progress Notes (Signed)
PROGRESS NOTE    [name]  LEX:[id number] DOB: [birth year] DOA: 04/13/2019 PCP: Shirlean Mylar, MD    Brief Narrative:  59 y.o.femalewith medical history significant ofessential hypertension, frequent PVCs, HLD who presents from the [facility] infusion center after being found hypoxic, oxygenating 70-80% on room air. Patient with positive Covid-19 infection on 04/10/2019 and was at the [facility] infusion center on 1/25bamlanivimabinfusion when she was found to be hypoxic and sent to the ED. She was requiring 3 L of O2. She has had COVID symptoms for at least 1 week now. She  reports loss of taste/smell, diarrhea and a poor appetite.  She states her daughter has COVID. She does not live with her daughter but she babysits her son.  She was admitted and started on COVID treatment.   Assessment & Plan:   Principal Problem:   Pneumonia due to COVID-19 virus Active Problems:   PVC (premature ventricular contraction)   Benign essential HTN   Dyslipidemia   Hypokalemia  Principal Problem:   Pneumonia due to COVID-19 virus with acute respiratory failure - CXR > Worsened aeration with progressive bilateral interstitial opacities, most consistent with COVID-19 pneumonia. - O2 requirements increased from 4L to 6L - cont Remdesivir and IV Steroids- she completed Bamlaniviimab 1/25 - CRP is trending down, however pt continues to require increased O2 requirments and remains with sob - Cont Combivent, Zinc and Vit C - diarrhea has improved -Given continued increased O2 requirements and symptoms, will add actemra. Discussed with patient side effects and off-label use. Patient is agreeable. Will order -Given continued increased O2 requirments, will transfer to [facility] Discussed with [facility] provider  Active Problems: Hypokalemia - replaced -Repeat cmp in AM  H/o PVCs and Palpitations - on Flecanide - sees Dr Ladona Ridgel - stopped HCTZ permanenty    Benign essential HTN - cont Metoprolol,  stopped HCTZ per above -BP stable thus far  DVT prophylaxis: Lovenox subq Code Status: Full Family Communication: Pt in room, family over phone Disposition Plan: Transfer to [facility]  Consultants:     Procedures:     Antimicrobials: Anti-infectives (From admission, onward)   Start     Dose/Rate Route Frequency Ordered Stop   04/14/19 1000  remdesivir 100 mg in sodium chloride 0.9 % 100 mL IVPB     100 mg 200 mL/hr over 30 Minutes Intravenous Daily 04/13/19 1104 04/18/19 0959   04/13/19 1230  remdesivir 200 mg in sodium chloride 0.9% 250 mL IVPB     200 mg 580 mL/hr over 30 Minutes Intravenous Once 04/13/19 1104 04/13/19 1239       Subjective: SOB this AM, still requiring increased O2  Objective: Vitals:   04/14/19 1527 04/14/19 2031 04/15/19 0421 04/15/19 1222  BP:  (!) 146/72 (!) 143/77 136/76  Pulse:  67 66 68  Resp: (!) 25     Temp:  97.9 F (36.6 C) 98.2 F (36.8 C) 98.6 F (37 C)  TempSrc:  Oral Oral Oral  SpO2:  90% 93% 93%  Weight:      Height:        Intake/Output Summary (Last 24 hours) at 04/15/2019 1437 Last data filed at 04/15/2019 1413 Gross per 24 hour  Intake 1853.31 ml  Output 0 ml  Net 1853.31 ml   Filed Weights   04/13/19 0950  Weight: 98.9 kg    Examination:  General exam: Appears calm and comfortable  Respiratory system: Clear to auscultation. Respiratory effort normal. Cardiovascular system: S1 & S2 heard, Regular Gastrointestinal  system: Abdomen is nondistended, soft and nontender. No organomegaly or masses felt. Normal bowel sounds heard. Central nervous system: Alert and oriented. No focal neurological deficits. Extremities: Symmetric 5 x 5 power. Skin: No rashes, lesions Psychiatry: Judgement and insight appear normal. Mood & affect appropriate.   Data Reviewed: I have personally reviewed following labs and imaging studies  CBC: Recent Labs  Lab 04/10/19 1338 04/13/19 1050 04/14/19 0351 04/15/19 0227  WBC 6.6 10.4 8.8  13.3*  NEUTROABS 5.3 9.1* 7.2 10.5*  HGB 12.1 12.5 11.5* 11.6*  HCT 37.1 39.6 36.9 36.5  MCV 92.5 93.4 94.1 93.1  PLT 238 361 396 937   Basic Metabolic Panel: Recent Labs  Lab 04/10/19 1338 04/13/19 1050 04/14/19 0351 04/15/19 0227  NA 134* 139 138 138  K 3.6 3.2* 3.6 3.8  CL 96* 101 104 106  CO2 29 26 25 25   GLUCOSE 128* 139* 169* 157*  BUN 9 11 12 14   CREATININE 0.92 0.80 0.74 0.70  CALCIUM 8.7* 8.9 8.5* 8.7*  MG  --  2.0  --   --    GFR: Estimated Creatinine Clearance: 91.4 mL/min (by C-G formula based on SCr of 0.7 mg/dL). Liver Function Tests: Recent Labs  Lab 04/10/19 1338 04/13/19 1050 04/14/19 0351 04/15/19 0227  AST 36 44* 34 31  ALT 25 32 25 25  ALKPHOS 39 45 41 45  BILITOT 1.1 1.2 0.8 1.0  PROT 7.1 7.2 6.7 6.5  ALBUMIN 3.5 3.3* 2.9* 3.0*   No results for input(s): LIPASE, AMYLASE in the last 168 hours. No results for input(s): AMMONIA in the last 168 hours. Coagulation Profile: No results for input(s): INR, PROTIME in the last 168 hours. Cardiac Enzymes: No results for input(s): CKTOTAL, CKMB, CKMBINDEX, TROPONINI in the last 168 hours. BNP (last 3 results) No results for input(s): PROBNP in the last 8760 hours. HbA1C: No results for input(s): HGBA1C in the last 72 hours. CBG: No results for input(s): GLUCAP in the last 168 hours. Lipid Profile: Recent Labs    04/13/19 1050  TRIG 94   Thyroid Function Tests: No results for input(s): TSH, T4TOTAL, FREET4, T3FREE, THYROIDAB in the last 72 hours. Anemia Panel: Recent Labs    04/13/19 1050  FERRITIN 1,561*   Sepsis Labs: Recent Labs  Lab 04/13/19 1050  PROCALCITON 0.17  LATICACIDVEN 2.5*    Recent Results (from the past 240 hour(s))  Blood Culture (routine x 2)     Status: None (Preliminary result)   Collection Time: 04/13/19 10:50 AM   Specimen: BLOOD  Result Value Ref Range Status   Specimen Description   Final    BLOOD RIGHT ANTECUBITAL Performed at [facility], [place] [address], [place], [place] [address]    Special Requests   Final    BOTTLES DRAWN AEROBIC AND ANAEROBIC Blood Culture adequate volume Performed at [facility] [address] [place], [place] [address]    Culture   Final    NO GROWTH 2 DAYS Performed at [facility] [address] [place], [place] [address]    Report Status PENDING  Incomplete  Blood Culture (routine x 2)     Status: None (Preliminary result)   Collection Time: 04/13/19 11:00 AM   Specimen: BLOOD  Result Value Ref Range Status   Specimen Description   Final    BLOOD LEFT ANTECUBITAL Performed at [facility] [address] [place], [place] [address]    Special Requests  Final    BOTTLES DRAWN AEROBIC AND ANAEROBIC Blood Culture adequate volume Performed at [facility], [address] [place], [place] [address]    Culture   Final    NO GROWTH 2 DAYS Performed at [facility], [address] [place], [place] [address]    Report Status PENDING  Incomplete     Radiology Studies: No results found.  Scheduled Meds: . vitamin C  500 mg Oral Daily  . aspirin  81 mg Oral Daily  . atorvastatin  40 mg Oral Daily  . dexamethasone (DECADRON) injection  6 mg Intravenous Q24H  . enoxaparin (LOVENOX) injection  40 mg Subcutaneous Q24H  . flecainide  75 mg Oral BID  . Ipratropium-Albuterol  1 puff Inhalation Q6H  . metoprolol succinate  50 mg Oral Daily  . zinc sulfate  220 mg Oral Daily   Continuous Infusions: . remdesivir 100 mg in NS 100 mL 100 mg (04/15/19 0921)  . tocilizumab (ACTEMRA) - non-COVID treatment       LOS: 2 days   Rickey Barbara, MD [facility] Pager On Amion  If 7PM-7AM, please contact night-coverage 04/15/2019, 2:37 PM

## 2019-04-15 NOTE — Progress Notes (Signed)
Report called to Kyra RN at [facility].

## 2019-04-15 NOTE — Plan of Care (Signed)

## 2019-04-15 NOTE — TOC Progression Note (Signed)
Transition of Care [facility]) - Progression Note    Patient Details  Name: [name] MRN: [id number] Date of Birth: 05/23/1960  Transition of [facility]) CM/SW Contact  Geni Bers, RN Phone Number: 04/15/2019, 2:51 PM  Clinical Narrative:     Spoke with pt on the phone. Pt really want to get better and go home. Pt from home with spouse. TOC will follow pt for discharge needs.   Expected Discharge Plan: Home/Self Care Barriers to Discharge: No Barriers Identified  Expected Discharge Plan and Services Expected Discharge Plan: Home/Self Care       Living arrangements for the past 2 months: Single Family Home                                       Social Determinants of Health (SDOH) Interventions    Readmission Risk Interventions No flowsheet data found.

## 2019-04-16 ENCOUNTER — Ambulatory Visit (HOSPITAL_COMMUNITY): Payer: BLUE CROSS/BLUE SHIELD

## 2019-04-16 LAB — COMPREHENSIVE METABOLIC PANEL
ALT: 23 U/L (ref 0–44)
AST: 27 U/L (ref 15–41)
Albumin: 2.8 g/dL — ABNORMAL LOW (ref 3.5–5.0)
Alkaline Phosphatase: 36 U/L — ABNORMAL LOW (ref 38–126)
Anion gap: 11 (ref 5–15)
BUN: 16 mg/dL (ref 6–20)
CO2: 21 mmol/L — ABNORMAL LOW (ref 22–32)
Calcium: 8.6 mg/dL — ABNORMAL LOW (ref 8.9–10.3)
Chloride: 106 mmol/L (ref 98–111)
Creatinine, Ser: 0.79 mg/dL (ref 0.44–1.00)
GFR calc Af Amer: >60 mL/min (ref >60)
GFR calc non Af Amer: >60 mL/min (ref >60)
Glucose, Bld: 149 mg/dL — ABNORMAL HIGH (ref 70–99)
Potassium: 4 mmol/L (ref 3.5–5.1)
Sodium: 138 mmol/L (ref 135–145)
Total Bilirubin: 1.2 mg/dL (ref 0.3–1.2)
Total Protein: 6 g/dL — ABNORMAL LOW (ref 6.5–8.1)

## 2019-04-16 LAB — CBC WITH DIFFERENTIAL/PLATELET
Abs Immature Granulocytes: 0.23 10*3/uL — ABNORMAL HIGH (ref 0.00–0.07)
Basophils Absolute: 0 10*3/uL (ref 0.0–0.1)
Basophils Relative: 0 %
Eosinophils Absolute: 0 10*3/uL (ref 0.0–0.5)
Eosinophils Relative: 0 %
HCT: 36.1 % (ref 36.0–46.0)
Hemoglobin: 11.7 g/dL — ABNORMAL LOW (ref 12.0–15.0)
Immature Granulocytes: 2 %
Lymphocytes Relative: 11 %
Lymphs Abs: 1.1 10*3/uL (ref 0.7–4.0)
MCH: 30.1 pg (ref 26.0–34.0)
MCHC: 32.4 g/dL (ref 30.0–36.0)
MCV: 92.8 fL (ref 80.0–100.0)
Monocytes Absolute: 0.6 10*3/uL (ref 0.1–1.0)
Monocytes Relative: 6 %
Neutro Abs: 8.4 10*3/uL — ABNORMAL HIGH (ref 1.7–7.7)
Neutrophils Relative %: 81 %
Platelets: 510 10*3/uL — ABNORMAL HIGH (ref 150–400)
RBC: 3.89 MIL/uL (ref 3.87–5.11)
RDW: 12.8 % (ref 11.5–15.5)
WBC: 10.3 10*3/uL (ref 4.0–10.5)
nRBC: 0 % (ref 0.0–0.2)

## 2019-04-16 LAB — D-DIMER, QUANTITATIVE: D-Dimer, Quant: 2.43 ug/mL-FEU — ABNORMAL HIGH (ref 0.00–0.50)

## 2019-04-16 LAB — C-REACTIVE PROTEIN: CRP: 2.7 mg/dL — ABNORMAL HIGH (ref <1.0)

## 2019-04-16 NOTE — Progress Notes (Signed)
PROGRESS NOTE    [name]  ZOX:[id number] DOB: Apr 20, 1960 DOA: 04/13/2019 PCP: Shirlean Mylar, MD    Brief Narrative:  59 y.o.femalewith medical history significant ofessential hypertension, frequent PVCs, HLD who presents [facility] after being found hypoxic, oxygenating 70-80% on room air. Patient with positive Covid-19 infection on 04/10/2019 and was at the [facility] infusion center on 1/25bamlanivimabinfusion when she was found to be hypoxic and sent to the ED. She was requiring 3 L of O2. She has had COVID symptoms for at least 1 week now. She  reports loss of taste/smell, diarrhea and a poor appetite.  She states her daughter has COVID. She does not live with her daughter but she babysits her son.  She was admitted and started on COVID treatment.   Assessment & Plan:   Principal Problem:   Pneumonia due to COVID-19 virus Active Problems:   PVC (premature ventricular contraction)   Benign essential HTN   Dyslipidemia   Hypokalemia  Principal Problem:   Pneumonia due to COVID-19 virus with acute respiratory failure - CXR > Worsened aeration with progressive bilateral interstitial opacities, most consistent with COVID-19 pneumonia. - O2 requirements increased from 4L to 6L and currently remains at [facility] - cont Remdesivir and IV Steroids- she completed Bamlaniviimab 1/25 - CRP continues to trend down, however pt continues to require above increased O2 requirments and remains with sob - Cont Combivent, Zinc and Vit C - diarrhea has improved -Given continued increased O2 requirements and symptoms, pt was given dose of actemra on 1/28. Discussed with patient side effects and off-label use for COVID-19. Patient was agreeable. -Given continued increased O2 requirments, pt was transferred to [facility] for further work up  Active Problems: Hypokalemia - replaced and normalized -Repeat cmp in AM  H/o PVCs and Palpitations - on Flecanide - sees Dr  Ladona Ridgel - stopped HCTZ permanenty    Benign essential HTN - cont Metoprolol, stopped HCTZ per above -BP remains stable thus far  DVT prophylaxis: Lovenox subq Code Status: Full Family Communication: Pt in room, updated pt's husband over phone 1/27 Disposition Plan: Transfer to [facility] today  Consultants:     Procedures:     Antimicrobials: Anti-infectives (From admission, onward)   Start     Dose/Rate Route Frequency Ordered Stop   04/14/19 1000  remdesivir 100 mg in sodium chloride 0.9 % 100 mL IVPB     100 mg 200 mL/hr over 30 Minutes Intravenous Daily 04/13/19 1104 04/18/19 0959   04/13/19 1230  remdesivir 200 mg in sodium chloride 0.9% 250 mL IVPB     200 mg 580 mL/hr over 30 Minutes Intravenous Once 04/13/19 1104 04/13/19 1239      Subjective: Still reporting sob on minimal exertion this AM  Objective: Vitals:   04/15/19 2109 04/16/19 0800 04/16/19 0910 04/16/19 1122  BP: 140/80  121/66 135/75  Pulse: 70   67  Resp: 20   (!) 22  Temp: 98.5 F (36.9 C)   98.7 F (37.1 C)  TempSrc: Oral   Oral  SpO2: 90% 92%  91%  Weight:      Height:        Intake/Output Summary (Last 24 hours) at 04/16/2019 1351 Last data filed at 04/16/2019 0839 Gross per 24 hour  Intake 458.08 ml  Output 0 ml  Net 458.08 ml   Filed Weights   04/13/19 0950  Weight: 98.9 kg    Examination: General exam: Awake, laying in bed, in nad Respiratory  system: Increased respiratory effort, no wheezing Cardiovascular system: regular rate, s1, s2 Gastrointestinal system: Soft, nondistended, positive BS Central nervous system: CN2-12 grossly intact, strength intact Extremities: Perfused, no clubbing Skin: Normal skin turgor, no notable skin lesions seen Psychiatry: Mood normal // no visual hallucinations   Data Reviewed: I have personally reviewed following labs and imaging studies  CBC: Recent Labs  Lab 04/10/19 1338 04/13/19 1050 04/14/19 0351 04/15/19 0227 04/16/19 0347  WBC 6.6  10.4 8.8 13.3* 10.3  NEUTROABS 5.3 9.1* 7.2 10.5* 8.4*  HGB 12.1 12.5 11.5* 11.6* 11.7*  HCT 37.1 39.6 36.9 36.5 36.1  MCV 92.5 93.4 94.1 93.1 92.8  PLT 238 361 396 400 510*   Basic Metabolic Panel: Recent Labs  Lab 04/10/19 1338 04/13/19 1050 04/14/19 0351 04/15/19 0227 04/16/19 0347  NA 134* 139 138 138 138  K 3.6 3.2* 3.6 3.8 4.0  CL 96* 101 104 106 106  CO2 29 26 25 25  21*  GLUCOSE 128* 139* 169* 157* 149*  BUN 9 11 12 14 16   CREATININE 0.92 0.80 0.74 0.70 0.79  CALCIUM 8.7* 8.9 8.5* 8.7* 8.6*  MG  --  2.0  --   --   --    GFR: Estimated Creatinine Clearance: 91.4 mL/min (by C-G formula based on SCr of 0.79 mg/dL). Liver Function Tests: Recent Labs  Lab 04/10/19 1338 04/13/19 1050 04/14/19 0351 04/15/19 0227 04/16/19 0347  AST 36 44* 34 31 27  ALT 25 32 25 25 23   ALKPHOS 39 45 41 45 36*  BILITOT 1.1 1.2 0.8 1.0 1.2  PROT 7.1 7.2 6.7 6.5 6.0*  ALBUMIN 3.5 3.3* 2.9* 3.0* 2.8*   No results for input(s): LIPASE, AMYLASE in the last 168 hours. No results for input(s): AMMONIA in the last 168 hours. Coagulation Profile: No results for input(s): INR, PROTIME in the last 168 hours. Cardiac Enzymes: No results for input(s): CKTOTAL, CKMB, CKMBINDEX, TROPONINI in the last 168 hours. BNP (last 3 results) No results for input(s): PROBNP in the last 8760 hours. HbA1C: No results for input(s): HGBA1C in the last 72 hours. CBG: No results for input(s): GLUCAP in the last 168 hours. Lipid Profile: No results for input(s): CHOL, HDL, LDLCALC, TRIG, CHOLHDL, LDLDIRECT in the last 72 hours. Thyroid Function Tests: No results for input(s): TSH, T4TOTAL, FREET4, T3FREE, THYROIDAB in the last 72 hours. Anemia Panel: No results for input(s): VITAMINB12, FOLATE, FERRITIN, TIBC, IRON, RETICCTPCT in the last 72 hours. Sepsis Labs: Recent Labs  Lab 04/13/19 1050  PROCALCITON 0.17  LATICACIDVEN 2.5*    Recent Results (from the past 240 hour(s))  Blood Culture (routine x  2)     Status: None (Preliminary result)   Collection Time: 04/13/19 10:50 AM   Specimen: BLOOD  Result Value Ref Range Status   Specimen Description   Final    BLOOD RIGHT ANTECUBITAL Performed at [facility], [address] [place], [place]    Special Requests   Final    BOTTLES DRAWN AEROBIC AND ANAEROBIC Blood Culture adequate volume Performed at [facility], [address] [place], [place]    Culture   Final    NO GROWTH 3 DAYS Performed at [facility], [address], [place], [facility] [address]    Report Status PENDING  Incomplete  Blood Culture (routine x 2)     Status: None (Preliminary result)   Collection Time: 04/13/19 11:00 AM   Specimen: BLOOD  Result Value  Ref Range Status   Specimen Description   Final    BLOOD LEFT ANTECUBITAL Performed at [place] [address], [place], [place] [address]    Special Requests   Final    BOTTLES DRAWN AEROBIC AND ANAEROBIC Blood Culture adequate volume Performed at [place] [address], [place], [place] [address]    Culture   Final    NO GROWTH 3 DAYS Performed at [facility], [place] [address], [place], [place] [address]    Report Status PENDING  Incomplete     Radiology Studies: No results found.  Scheduled Meds: . vitamin C  500 mg Oral Daily  . aspirin  81 mg Oral Daily  . atorvastatin  40 mg Oral Daily  . dexamethasone (DECADRON) injection  6 mg Intravenous Q24H  . enoxaparin (LOVENOX) injection  40 mg Subcutaneous Q24H  . flecainide  75 mg Oral BID  . Ipratropium-Albuterol  1 puff Inhalation Q6H  . metoprolol succinate  50 mg Oral Daily  . zinc sulfate  220 mg Oral Daily   Continuous Infusions: . remdesivir 100 mg in NS 100 mL 100 mg (04/15/19 0921)     LOS: 3 days   Marylu Lund, MD [facility] Pager On Amion  If 7PM-7AM, please contact night-coverage 04/16/2019, 1:51 PM

## 2019-04-16 NOTE — Progress Notes (Signed)
Report given to RN at [facility]. Pt stabilized for transfer . Family updated on plan of care

## 2019-04-16 NOTE — Progress Notes (Signed)
Patient was seen and examined this AM. Full note will follow.

## 2019-04-16 NOTE — Progress Notes (Signed)
Patient was transferred from [facility] earlier today-seen by Dr. Rhona Leavens earlier this morning.  Please refer to note for assessment and plan.  I briefly saw the patient-she feels better than yesterday-and was sitting up at bedside chair without any major issues- on 5 L of oxygen.  Labs reordered.  No charge note

## 2019-04-17 LAB — CBC WITH DIFFERENTIAL/PLATELET
Abs Immature Granulocytes: 0.16 10*3/uL — ABNORMAL HIGH (ref 0.00–0.07)
Basophils Absolute: 0 10*3/uL (ref 0.0–0.1)
Basophils Relative: 0 %
Eosinophils Absolute: 0 10*3/uL (ref 0.0–0.5)
Eosinophils Relative: 0 %
HCT: 35.8 % — ABNORMAL LOW (ref 36.0–46.0)
Hemoglobin: 11.6 g/dL — ABNORMAL LOW (ref 12.0–15.0)
Immature Granulocytes: 2 %
Lymphocytes Relative: 10 %
Lymphs Abs: 1 10*3/uL (ref 0.7–4.0)
MCH: 30 pg (ref 26.0–34.0)
MCHC: 32.4 g/dL (ref 30.0–36.0)
MCV: 92.5 fL (ref 80.0–100.0)
Monocytes Absolute: 0.4 10*3/uL (ref 0.1–1.0)
Monocytes Relative: 4 %
Neutro Abs: 8.6 10*3/uL — ABNORMAL HIGH (ref 1.7–7.7)
Neutrophils Relative %: 84 %
Platelets: 497 10*3/uL — ABNORMAL HIGH (ref 150–400)
RBC: 3.87 MIL/uL (ref 3.87–5.11)
RDW: 12.8 % (ref 11.5–15.5)
WBC: 10.2 10*3/uL (ref 4.0–10.5)
nRBC: 0 % (ref 0.0–0.2)

## 2019-04-17 LAB — COMPREHENSIVE METABOLIC PANEL
ALT: 27 U/L (ref 0–44)
AST: 29 U/L (ref 15–41)
Albumin: 2.9 g/dL — ABNORMAL LOW (ref 3.5–5.0)
Alkaline Phosphatase: 38 U/L (ref 38–126)
Anion gap: 6 (ref 5–15)
BUN: 19 mg/dL (ref 6–20)
CO2: 28 mmol/L (ref 22–32)
Calcium: 8.5 mg/dL — ABNORMAL LOW (ref 8.9–10.3)
Chloride: 103 mmol/L (ref 98–111)
Creatinine, Ser: 0.89 mg/dL (ref 0.44–1.00)
GFR calc Af Amer: >60 mL/min (ref >60)
GFR calc non Af Amer: >60 mL/min (ref >60)
Glucose, Bld: 167 mg/dL — ABNORMAL HIGH (ref 70–99)
Potassium: 4.4 mmol/L (ref 3.5–5.1)
Sodium: 137 mmol/L (ref 135–145)
Total Bilirubin: 1.2 mg/dL (ref 0.3–1.2)
Total Protein: 6.2 g/dL — ABNORMAL LOW (ref 6.5–8.1)

## 2019-04-17 LAB — C-REACTIVE PROTEIN: CRP: 1.5 mg/dL — ABNORMAL HIGH (ref <1.0)

## 2019-04-17 LAB — D-DIMER, QUANTITATIVE: D-Dimer, Quant: 3.15 ug/mL-FEU — ABNORMAL HIGH (ref 0.00–0.50)

## 2019-04-17 NOTE — Progress Notes (Signed)
PROGRESS NOTE                                                                                                                                                                                                             Patient Demographics:    [name], is a 59 y.o. female, DOB - May 13, 1960, OZD:[id number]  Outpatient Primary MD for the patient is Maurice Small, MD   Admit date - 04/13/2019   LOS - 4  Chief Complaint  Patient presents with  . low oxygen saturation  . COVID positive       Brief Narrative: Patient is a 59 y.o. female with PMHx of HTN, HLD- Covid + since 1/22 who was at [facility] infusion center-and was found to have hypoxia-and transferred to [place] subsequently admitted to the hospitalist service for eval and treatment of acute hypoxemic respiratory failure secondary to COVID-19 pneumonia.  Significant events: 1/20: Covid positive (per prior documentation) 1/25: Hypoxic at [facility] infusion center when she arrived for Bamlanivimab infusion (never received it)-admitted to [place] 1/28: Transfer to [place]  COVID-19 medications Steroids: 1/25>> Remdesivir: 1/25>> Actemra: 1/27 x 1 at [facility]   Subjective:    [name] today feels better-she was on 5 L of oxygen-but when I was in the room-I was able to titrate her down to 3.5 L.  She still has dyspnea with minimal ambulation.   Assessment  & Plan :   Acute Hypoxic Resp Failure due to Covid 19 Viral pneumonia: Overall improved-but still hypoxic and requiring 3-5 L of oxygen at rest.  Gets dyspnea with minimal exertion.  Continue steroids and remdesivir.  Continued attempts to slowly titrate down FiO2.  Await eval by PT to see how she does with ambulation.  Fever: afebrile  O2 requirements:  SpO2: 95 % O2 Flow Rate (L/min): 5 L/min   COVID-19 Labs: Recent Labs    04/15/19 0227 04/16/19 0347 04/17/19 0252  DDIMER 2.04* 2.43* 3.15*  CRP 6.1* 2.7* 1.5*     No results found for: BNP  Recent Labs  Lab 04/13/19 1050  PROCALCITON 0.17    No results found for: SARSCOV2NAA    Prone/Incentive Spirometry: encouraged incentive spirometry use 3-4/hour.  DVT Prophylaxis  :  Lovenox   History of frequent/heavy burden PVCs: Controlled with flecainide/metoprolol-followed by EP-Dr. Lovena Le.  Continue telemetry monitoring  HTN: Controlled-continue metoprolol  HLD: Continue statin  Obesity: Estimated body mass index is 34.14 kg/m as calculated from the following:   Height as of this encounter: 5\' 7"  (1.702 m).   Weight as of this encounter: 98.9 kg.   Consults  :  None  Procedures  :  None  ABG:    Component Value Date/Time   TCO2 23 05/02/2014 2248    Vent Settings: N/A  Condition -Guarded  Family Communication  :  Spouse updated over the phone 1/29  Code Status :  Full Code  Diet :  Diet Order            Diet Heart Room service appropriate? Yes; Fluid consistency: Thin  Diet effective now               Disposition Plan  :  Remain hospitalized-home with home health depending on clinical progress.  Barriers to discharge: Hypoxia requiring O2 supplementation/complete 5 days of IV Remdesivir  Antimicorbials  :    Anti-infectives (From admission, onward)   Start     Dose/Rate Route Frequency Ordered Stop   04/14/19 1000  remdesivir 100 mg in sodium chloride 0.9 % 100 mL IVPB     100 mg 200 mL/hr over 30 Minutes Intravenous Daily 04/13/19 1104 04/18/19 0959   04/13/19 1230  remdesivir 200 mg in sodium chloride 0.9% 250 mL IVPB     200 mg 580 mL/hr over 30 Minutes Intravenous Once 04/13/19 1104 04/13/19 1239      Inpatient Medications  Scheduled Meds: . vitamin C  500 mg Oral Daily  . aspirin  81 mg Oral Daily  . atorvastatin  40 mg Oral Daily  . dexamethasone (DECADRON) injection  6 mg Intravenous Q24H  . enoxaparin (LOVENOX) injection  40 mg Subcutaneous Q24H  . flecainide  75 mg Oral BID  .  Ipratropium-Albuterol  1 puff Inhalation Q6H  . metoprolol succinate  50 mg Oral Daily  . zinc sulfate  220 mg Oral Daily   Continuous Infusions: . remdesivir 100 mg in NS 100 mL Stopped (04/16/19 1250)   PRN Meds:.acetaminophen **OR** acetaminophen, guaiFENesin-dextromethorphan, ondansetron **OR** ondansetron (ZOFRAN) IV, polyethylene glycol   Time Spent in minutes  25 See all Orders from today for further details   04/18/19 M.D on 04/17/2019 at 7:28 AM  To page go to [email or web address] - use universal password  [facility] -  Office  [phone]    Objective:   Vitals:   04/16/19 2255 04/16/19 2257 04/17/19 0000 04/17/19 0419  BP: 127/75  124/65 118/72  Pulse: 79  64 65  Resp: 13  (!) 21 20  Temp: 97.7 F (36.5 C)  98.4 F (36.9 C) 98.9 F (37.2 C)  TempSrc: Axillary  Oral Oral  SpO2: 92% 94% 98% 95%  Weight:      Height:        Wt Readings from Last 3 Encounters:  04/13/19 98.9 kg  04/10/19 98.9 kg  12/23/18 100.6 kg     Intake/Output Summary (Last 24 hours) at 04/17/2019 04/19/2019 Last data filed at 04/16/2019 1800 Gross per 24 hour  Intake 700 ml  Output 500 ml  Net 200 ml     Physical Exam Gen Exam:Alert awake-not in any distress HEENT:atraumatic, normocephalic Chest: B/L clear to auscultation anteriorly CVS:S1S2 regular Abdomen:soft non tender, non distended Extremities:no edema Neurology: Non focal Skin: no rash   Data Review:    CBC Recent Labs  Lab 04/13/19 1050 04/14/19 0351 04/15/19 04/17/19  04/16/19 0347 04/17/19 0252  WBC 10.4 8.8 13.3* 10.3 10.2  HGB 12.5 11.5* 11.6* 11.7* 11.6*  HCT 39.6 36.9 36.5 36.1 35.8*  PLT 361 396 400 510* 497*  MCV 93.4 94.1 93.1 92.8 92.5  MCH 29.5 29.3 29.6 30.1 30.0  MCHC 31.6 31.2 31.8 32.4 32.4  RDW 13.1 13.1 13.0 12.8 12.8  LYMPHSABS 0.7 1.2 1.8 1.1 1.0  MONOABS 0.5 0.3 0.8 0.6 0.4  EOSABS 0.0 0.0 0.0 0.0 0.0  BASOSABS 0.0 0.0 0.0 0.0 0.0    Chemistries  Recent Labs  Lab  04/13/19 1050 04/14/19 0351 04/15/19 0227 04/16/19 0347 04/17/19 0252  NA 139 138 138 138 137  K 3.2* 3.6 3.8 4.0 4.4  CL 101 104 106 106 103  CO2 26 25 25  21* 28  GLUCOSE 139* 169* 157* 149* 167*  BUN 11 12 14 16 19   CREATININE 0.80 0.74 0.70 0.79 0.89  CALCIUM 8.9 8.5* 8.7* 8.6* 8.5*  MG 2.0  --   --   --   --   AST 44* 34 31 27 29   ALT 32 25 25 23 27   ALKPHOS 45 41 45 36* 38  BILITOT 1.2 0.8 1.0 1.2 1.2   ------------------------------------------------------------------------------------------------------------------ No results for input(s): CHOL, HDL, LDLCALC, TRIG, CHOLHDL, LDLDIRECT in the last 72 hours.  No results found for: HGBA1C ------------------------------------------------------------------------------------------------------------------ No results for input(s): TSH, T4TOTAL, T3FREE, THYROIDAB in the last 72 hours.  Invalid input(s): FREET3 ------------------------------------------------------------------------------------------------------------------ No results for input(s): VITAMINB12, FOLATE, FERRITIN, TIBC, IRON, RETICCTPCT in the last 72 hours.  Coagulation profile No results for input(s): INR, PROTIME in the last 168 hours.  Recent Labs    04/16/19 0347 04/17/19 0252  DDIMER 2.43* 3.15*    Cardiac Enzymes No results for input(s): CKMB, TROPONINI, MYOGLOBIN in the last 168 hours.  Invalid input(s): CK ------------------------------------------------------------------------------------------------------------------ No results found for: BNP  Micro Results Recent Results (from the past 240 hour(s))  Blood Culture (routine x 2)     Status: None (Preliminary result)   Collection Time: 04/13/19 10:50 AM   Specimen: BLOOD  Result Value Ref Range Status   Specimen Description   Final    BLOOD RIGHT ANTECUBITAL Performed at [facility], [address], [place], [place]    Special Requests   Final    BOTTLES DRAWN  AEROBIC AND ANAEROBIC Blood Culture adequate volume Performed at [facility], [address], [place], [place]    Culture   Final    NO GROWTH 3 DAYS Performed at [facility], [address], [place], [address] [facility]    Report Status PENDING  Incomplete  Blood Culture (routine x 2)     Status: None (Preliminary result)   Collection Time: 04/13/19 11:00 AM   Specimen: BLOOD  Result Value Ref Range Status   Specimen Description   Final    BLOOD LEFT ANTECUBITAL Performed at [facility] [address], [place], [place]    Special Requests   Final    BOTTLES DRAWN AEROBIC AND ANAEROBIC Blood Culture adequate volume Performed at [facility], [address], [place], [place]    Culture   Final    NO GROWTH 3 DAYS Performed at [facility], [address] [place], [address] [facility]    Report Status PENDING  Incomplete    Radiology Reports DG Chest Port 1 View  Result Date: 04/13/2019 CLINICAL DATA:  Shortness of  breath.  Weakness.  COVID-19 positive. EXAM: PORTABLE CHEST 1 VIEW COMPARISON:  04/09/2019 FINDINGS: Patient rotated right. Normal heart size. No pleural effusion or pneumothorax. Progressive lower lobe predominant interstitial opacities, slightly greater right than left. Lung volumes are mildly low. IMPRESSION: Worsened aeration with progressive bilateral interstitial opacities, most consistent with COVID-19 pneumonia. Electronically Signed   By: Jeronimo Greaves M.D.   On: 04/13/2019 10:30   DG Chest Port 1 View  Result Date: 04/10/2019 CLINICAL DATA:  COVID positive.  Low O2 sats. EXAM: PORTABLE CHEST 1 VIEW COMPARISON:  05/03/2014. FINDINGS: Mediastinum hilar structures normal. Heart size normal. Mild bilateral interstitial prominence suggesting pneumonitis in this known COVID positive patient. No prominent pleural effusion. No pneumothorax. IMPRESSION: Mild bilateral  interstitial prominence suggesting pneumonitis in this known COVID positive patient. Electronically Signed   By: Maisie Fus  Register   On: 04/10/2019 13:10

## 2019-04-17 NOTE — Evaluation (Signed)
Physical Therapy Evaluation Patient Details Name: Ercell Perlman MRN: [id number] DOB: April 02, 1960 Today's Date: 04/17/2019   History of Present Illness  59 y.o. female with medical history significant of essential hypertension, frequent PVCs, HLD who presents from the [facility] infusion center after being found hypoxic, oxygenating 70-80% on room air.  Patient with positive Covid-19 infection on 04/10/2019 and was at the [facility] infusion center on 1/25 bamlanivimab infusion when she was found to be hypoxic and sent to the ED. She was requiring 3 L of O2. She has had COVID symptoms for at least 1 week now.  She  reports loss of taste/smell, diarrhea and a poor appetite.  Clinical Impression   Pt admitted with above diagnosis. PTA was living home and was very independent, did not use any DME/AD. Pt currently with functional limitations due to the deficits listed below (see PT Problem List). Pt presents with decreased independence and activity tolerance. She was able to get around room with set up and mod I, able to ambulate approx 234ft with stand by assist, pt was on 5L/min at rest and sats in 90s, with ambulation had to increase to 6L/min as portable tank does not have 5L setting, pt did well while ambulating stas in low 90s after at end sats decreased to min of 83% briefly and was able to recover to 90s very quickly (<4min). Pt has been educated on importance of and use of incentive spirometer (pulls ) and flutter valve, also completing sit<>stand during day. Pt reduced to 4L/min at end and was able to maintain 90s saturation. Pt will benefit from skilled PT to increase their independence and safety with mobility to allow discharge to the venue listed below.       Follow Up Recommendations No PT follow up    Equipment Recommendations  None recommended by PT    Recommendations for Other Services       Precautions / Restrictions Precautions Precautions: None Restrictions Weight Bearing  Restrictions: No      Mobility  Bed Mobility               General bed mobility comments: Pt up in chair at arrival  Transfers Overall transfer level: Modified independent Equipment used: None                Ambulation/Gait Ambulation/Gait assistance: Supervision Gait Distance (Feet): 200 Feet Assistive device: None Gait Pattern/deviations: Step-through pattern     General Gait Details: ambulated on 6L/min and sats remained in 90s while ambulating after ambulation desat to 83%  Stairs            Wheelchair Mobility    Modified Rankin (Stroke Patients Only)       Balance Overall balance assessment: No apparent balance deficits (not formally assessed)                                           Pertinent Vitals/Pain Pain Assessment: No/denies pain    Home Living Family/patient expects to be discharged to:: Private residence Living Arrangements: Spouse/significant other Available Help at Discharge: Family Type of Home: House Home Access: Other (comment)(1 STE)     Home Layout: One level Home Equipment: None      Prior Function Level of Independence: Independent               Hand Dominance  Extremity/Trunk Assessment   Upper Extremity Assessment Upper Extremity Assessment: Overall WFL for tasks assessed    Lower Extremity Assessment Lower Extremity Assessment: Overall WFL for tasks assessed    Cervical / Trunk Assessment Cervical / Trunk Assessment: Normal  Communication   Communication: No difficulties  Cognition Arousal/Alertness: Awake/alert Behavior During Therapy: WFL for tasks assessed/performed Overall Cognitive Status: Within Functional Limits for tasks assessed                                        General Comments      Exercises Other Exercises Other Exercises: flutter valve x 5 Other Exercises: incentive spirometer x 5 pulls 1248ml   Assessment/Plan    PT  Assessment Patient needs continued PT services  PT Problem List Decreased activity tolerance       PT Treatment Interventions Gait training;Functional mobility training;Therapeutic activities;Therapeutic exercise;Balance training;Neuromuscular re-education;Patient/family education    PT Goals (Current goals can be found in the Care Plan section)  Acute Rehab PT Goals Patient Stated Goal: to go home hopefully not on supp 02 PT Goal Formulation: With patient Time For Goal Achievement: 05/01/19 Potential to Achieve Goals: Good    Frequency Min 3X/week   Barriers to discharge        Co-evaluation               AM-PAC PT "6 Clicks" Mobility  Outcome Measure Help needed turning from your back to your side while in a flat bed without using bedrails?: None Help needed moving from lying on your back to sitting on the side of a flat bed without using bedrails?: None Help needed moving to and from a bed to a chair (including a wheelchair)?: None Help needed standing up from a chair using your arms (e.g., wheelchair or bedside chair)?: None Help needed to walk in hospital room?: A Little Help needed climbing 3-5 steps with a railing? : A Lot 6 Click Score: 21    End of Session Equipment Utilized During Treatment: Oxygen Activity Tolerance: Patient tolerated treatment well Patient left: in chair;with call bell/phone within reach   PT Visit Diagnosis: Other abnormalities of gait and mobility (R26.89)    Time: 3557-3220 PT Time Calculation (min) (ACUTE ONLY): 16 min   Charges:   PT Evaluation $PT Eval Moderate Complexity: 1 Mod          Horald Chestnut, PT   Delford Field 04/17/2019, 4:47 PM

## 2019-04-18 LAB — CULTURE, BLOOD (ROUTINE X 2)
Culture: NO GROWTH
Culture: NO GROWTH
Special Requests: ADEQUATE
Special Requests: ADEQUATE

## 2019-04-18 LAB — COMPREHENSIVE METABOLIC PANEL
ALT: 29 U/L (ref 0–44)
AST: 31 U/L (ref 15–41)
Albumin: 2.9 g/dL — ABNORMAL LOW (ref 3.5–5.0)
Alkaline Phosphatase: 39 U/L (ref 38–126)
Anion gap: 9 (ref 5–15)
BUN: 18 mg/dL (ref 6–20)
CO2: 25 mmol/L (ref 22–32)
Calcium: 8.4 mg/dL — ABNORMAL LOW (ref 8.9–10.3)
Chloride: 103 mmol/L (ref 98–111)
Creatinine, Ser: 0.73 mg/dL (ref 0.44–1.00)
GFR calc Af Amer: >60 mL/min (ref >60)
GFR calc non Af Amer: >60 mL/min (ref >60)
Glucose, Bld: 155 mg/dL — ABNORMAL HIGH (ref 70–99)
Potassium: 4.2 mmol/L (ref 3.5–5.1)
Sodium: 137 mmol/L (ref 135–145)
Total Bilirubin: 1.2 mg/dL (ref 0.3–1.2)
Total Protein: 5.9 g/dL — ABNORMAL LOW (ref 6.5–8.1)

## 2019-04-18 LAB — CBC WITH DIFFERENTIAL/PLATELET
Abs Immature Granulocytes: 0.2 10*3/uL — ABNORMAL HIGH (ref 0.00–0.07)
Basophils Absolute: 0 10*3/uL (ref 0.0–0.1)
Basophils Relative: 0 %
Eosinophils Absolute: 0 10*3/uL (ref 0.0–0.5)
Eosinophils Relative: 0 %
HCT: 35 % — ABNORMAL LOW (ref 36.0–46.0)
Hemoglobin: 11.2 g/dL — ABNORMAL LOW (ref 12.0–15.0)
Immature Granulocytes: 2 %
Lymphocytes Relative: 11 %
Lymphs Abs: 1 10*3/uL (ref 0.7–4.0)
MCH: 29.7 pg (ref 26.0–34.0)
MCHC: 32 g/dL (ref 30.0–36.0)
MCV: 92.8 fL (ref 80.0–100.0)
Monocytes Absolute: 0.4 10*3/uL (ref 0.1–1.0)
Monocytes Relative: 4 %
Neutro Abs: 7.6 10*3/uL (ref 1.7–7.7)
Neutrophils Relative %: 83 %
Platelets: 497 10*3/uL — ABNORMAL HIGH (ref 150–400)
RBC: 3.77 MIL/uL — ABNORMAL LOW (ref 3.87–5.11)
RDW: 12.8 % (ref 11.5–15.5)
WBC: 9.2 10*3/uL (ref 4.0–10.5)
nRBC: 0.2 % (ref 0.0–0.2)

## 2019-04-18 LAB — C-REACTIVE PROTEIN: CRP: 1 mg/dL — ABNORMAL HIGH (ref <1.0)

## 2019-04-18 LAB — D-DIMER, QUANTITATIVE: D-Dimer, Quant: 3.37 ug/mL-FEU — ABNORMAL HIGH (ref 0.00–0.50)

## 2019-04-18 MED ORDER — METHYLPREDNISOLONE 4 MG PO TBPK: ORAL_TABLET | ORAL | 0 refills | Status: DC

## 2019-04-18 MED ORDER — PANTOPRAZOLE SODIUM 40 MG PO TBEC: 40.0000 mg | DELAYED_RELEASE_TABLET | Freq: Every day | ORAL | 0 refills | Status: DC

## 2019-04-18 MED ORDER — ASPIRIN 81 MG PO CHEW: 81.0000 mg | CHEWABLE_TABLET | Freq: Every day | ORAL | Status: AC

## 2019-04-18 MED ORDER — APIXABAN 2.5 MG PO TABS: 2.5000 mg | ORAL_TABLET | Freq: Two times a day (BID) | ORAL | 0 refills | Status: DC

## 2019-04-18 NOTE — TOC Transition Note (Addendum)
Transition of Care [facility]) - CM/SW Discharge Note   Patient Details  Name: [name] MRN: [id number] Date of Birth: 1960/05/01  Transition of Care [facility]) CM/SW Contact:  Durenda Guthrie, RN Phone Number: [phone] (working remotely) 04/18/2019, 2:36 PM   Clinical Narrative:   Case manager spoke with patient's husband concerning her need for oxygen at discharge. Address confirmed. Referral called to Durward Fortes with [employer]. Mr. Borre is aware that he can pick his wife up from [facility] after concentrator and tanks have been delivered. Case manager printed Eliquis discount card to nursing unit for patient. Notified charge nurse. AC asked to deliver a transport tank to patient's room for discharge.     Final next level of care: Home/Self Care Barriers to Discharge: No Barriers Identified   Patient Goals and CMS Choice Patient states their goals for this hospitalization and ongoing recovery are:: To get better and go home   Choice offered to / list presented to : Spouse  Discharge Placement                       Discharge Plan and Services   Discharge Planning Services: CM Consult Post Acute Care Choice: Durable Medical Equipment          DME Arranged: Oxygen DME Agency: [facility] Date DME Agency Contacted: 04/18/19 Time DME Agency Contacted: 1400 Representative spoke with at DME Agency: Durward Fortes HH Arranged: NA HH Agency: NA        Social Determinants of Health (SDOH) Interventions     Readmission Risk Interventions No flowsheet data found.

## 2019-04-18 NOTE — NC FL2 (Signed)
SATURATION QUALIFICATIONS: (This note is used to comply with regulatory documentation for home oxygen)  Patient Saturations on Room Air at Rest = 89%  Patient Saturations on Room Air while Ambulating = 80%  Patient Saturations on 2 Liters of oxygen while Ambulating = 90%  Please briefly explain why patient needs home oxygen: Patient has Covid-19.

## 2019-04-18 NOTE — Final Progress Note (Signed)
Pt IV removed. Discharge instructions and eliquis coupon card explained and given. Education completed. Contract signed. Portable O2 tank teaching done and sent with patient. Husband states and CM confirms concentrator has been delivered at home.

## 2019-04-18 NOTE — Discharge Summary (Signed)
[name] [id number]:[id number] DOB: 10-08-1960 DOA: 04/13/2019  PCP: Maurice Small, MD  Admit date: 04/13/2019  Discharge date: 04/18/2019  Admitted From:Home   Disposition:  Home   Recommendations for Outpatient Follow-up:   Follow up with PCP in 1-2 weeks  PCP Please obtain BMP/CBC, 2 view CXR in 1week,  (see Discharge instructions)   PCP Please follow up on the following pending results:    Home Health: None   Equipment/Devices: 2 lit o2 PRN  Consultations: None  Discharge Condition: Stable    CODE STATUS: Full    Diet Recommendation: Heart Healthy     Chief Complaint  Patient presents with  . low oxygen saturation  . COVID positive     Brief history of present illness from the day of admission and additional interim summary    Patient is a 58 y.o. female with PMHx of HTN, HLD- Covid + since 1/22 who was at [facility] infusion center-and was found to have hypoxia-and transferred to [place] subsequently admitted to the hospitalist service for eval and treatment of acute hypoxemic respiratory failure secondary to COVID-19 pneumonia.  Significant events: 1/20: Covid positive (per prior documentation) 1/25: Hypoxic at [facility] infusion center when she arrived for Bamlanivimabinfusion (never received it)-admitted to [facility] 1/28: Transfer to [facility]  COVID-19 medications Steroids: 1/25>> Remdesivir: 1/25>> Actemra: 1/27 x 1 at [facility] Course   Acute Hypoxic Resp Failure due to Covid 19 Viral pneumonia: She had moderate to severe disease required 5 to 8 L of nasal cannula oxygen initially, was treated with full scope of treatment which included IV steroids, remdesivir and Actemra, now down to 1.5 L nasal cannula oxygen at rest and completely  symptom-free, has finished her IV medication treatments and will be discharged home on 2 L nasal cannula oxygen as needed along with oral steroid taper.  Since her D-dimer is higher than normal she will get 2 weeks of oral Eliquis, request PCP to monitor closely.   Recent Labs  Lab 04/13/19 1050 04/13/19 1050 04/14/19 0351 04/14/19 0809 04/15/19 0227 04/16/19 0347 04/17/19 0252 04/18/19 0205  CRP 15.2*   < >  --  12.0* 6.1* 2.7* 1.5* 1.0*  DDIMER 2.20*   < > 1.94*  --  2.04* 2.43* 3.15* 3.37*  FERRITIN 1,561*  --   --   --   --   --   --   --   PROCALCITON 0.17  --   --   --   --   --   --   --    < > = values in this interval not displayed.    Hepatic Function Latest Ref  Rng & Units 04/18/2019 04/17/2019 04/16/2019  Total Protein 6.5 - 8.1 g/dL 5.9(L) 6.2(L) 6.0(L)  Albumin 3.5 - 5.0 g/dL 2.9(L) 2.9(L) 2.8(L)  AST 15 - 41 U/L '31 29 27  ' ALT 0 - 44 U/L '29 27 23  ' Alk Phosphatase 38 - 126 U/L 39 38 36(L)  Total Bilirubin 0.3 - 1.2 mg/dL 1.2 1.2 1.2  Bilirubin, Direct 0.00 - 0.40 mg/dL - - -    History of frequent/heavy burden PVCs: Controlled with flecainide/metoprolol-followed by EP-Dr. Lovena Le.    No acute issues.  HTN: Controlled-continue metoprolol  HLD: Continue statin  Obesity: BMI 34, follow with PCP for weight loss.     Discharge diagnosis     Principal Problem:   Pneumonia due to COVID-19 virus Active Problems:   PVC (premature ventricular contraction)   Benign essential HTN   Dyslipidemia   Hypokalemia    Discharge instructions    Discharge Instructions    Diet - low sodium heart healthy   Complete by: As directed    Discharge instructions   Complete by: As directed    Follow with Primary MD Maurice Small, MD in 7 days   Get CBC, CMP, 2 view Chest X ray -  checked next visit within 1 week by Primary MD    Activity: As tolerated with Full fall precautions use walker/cane & assistance as needed  Disposition Home   Diet: Heart Healthy    Special  Instructions: If you have smoked or chewed Tobacco  in the last 2 yrs please stop smoking, stop any regular Alcohol  and or any Recreational drug use.  On your next visit with your primary care physician please Get Medicines reviewed and adjusted.  Please request your Prim.MD to go over all Hospital Tests and Procedure/Radiological results at the follow up, please get all Hospital records sent to your Prim MD by signing hospital release before you go home.  If you experience worsening of your admission symptoms, develop shortness of breath, life threatening emergency, suicidal or homicidal thoughts you must seek medical attention immediately by calling 911 or calling your MD immediately  if symptoms less severe.  You Must read complete instructions/literature along with all the possible adverse reactions/side effects for all the Medicines you take and that have been prescribed to you. Take any new Medicines after you have completely understood and accpet all the possible adverse reactions/side effects.   Increase activity slowly   Complete by: As directed       Discharge Medications   Allergies as of 04/18/2019      Reactions   Buprenorphine Hcl Itching   Morphine And Related Itching   Other    Short Ragweed Pollen Ext Hives      Medication List    STOP taking these medications   amoxicillin-clavulanate 875-125 MG tablet Commonly known as: AUGMENTIN     TAKE these medications   acetaminophen 325 MG tablet Commonly known as: TYLENOL Take 325 mg by mouth every 6 (six) hours as needed for mild pain or headache.   apixaban 2.5 MG Tabs tablet Commonly known as: Eliquis Take 1 tablet (2.5 mg total) by mouth 2 (two) times daily.   aspirin 81 MG chewable tablet Chew 1 tablet (81 mg total) by mouth daily. Start taking on: May 02, 2019 What changed: These instructions start on May 02, 2019. If you are unsure what to do until then, ask your doctor or other care provider.     atorvastatin 40 MG tablet Commonly  known as: LIPITOR Take 1 tablet by mouth once daily   cholecalciferol 1000 units tablet Commonly known as: VITAMIN D Take 1,000 Units by mouth daily.   estradiol 0.05 MG/24HR patch Commonly known as: VIVELLE-DOT Place 1 patch onto the skin 2 (two) times a week.   flecainide 150 MG tablet Commonly known as: TAMBOCOR Take 0.5 tablets (75 mg total) by mouth 2 (two) times daily.   hydrochlorothiazide 25 MG tablet Commonly known as: HYDRODIURIL Take 25 mg by mouth daily.   methylPREDNISolone 4 MG Tbpk tablet Commonly known as: MEDROL DOSEPAK follow package directions   metoprolol succinate 50 MG 24 hr tablet Commonly known as: TOPROL-XL Take 1 tablet (50 mg total) by mouth daily. Take with or immediately following a meal.   pantoprazole 40 MG tablet Commonly known as: Protonix Take 1 tablet (40 mg total) by mouth daily.   Potassium Chloride ER 20 MEQ Tbcr Take 20 mEq by mouth daily.   ProAir HFA 108 (90 Base) MCG/ACT inhaler Generic drug: albuterol Inhale 2 puffs into the lungs as needed for shortness of breath.   promethazine-codeine 6.25-10 MG/5ML syrup Commonly known as: PHENERGAN with CODEINE Take 5 mLs by mouth every 6 (six) hours as needed for cough.            Durable Medical Equipment  (From admission, onward)         Start     Ordered   04/18/19 0817  For home use only DME oxygen  Once    Question Answer Comment  Length of Need 6 Months   Mode or (Route) Nasal cannula   Liters per Minute 2   Frequency Continuous (stationary and portable oxygen unit needed)   Oxygen conserving device Yes   Oxygen delivery system Gas      04/18/19 0816          Follow-up Information    Maurice Small, MD. Schedule an appointment as soon as possible for a visit in 1 week(s).   Specialty: Family Medicine Contact information: [place] [address] [phone]           Major procedures  and Radiology Reports - PLEASE review detailed and final reports thoroughly  -        DG Chest [facility] 1 View  Result Date: 04/13/2019 CLINICAL DATA:  Shortness of breath.  Weakness.  COVID-19 positive. EXAM: PORTABLE CHEST 1 VIEW COMPARISON:  04/09/2019 FINDINGS: Patient rotated right. Normal heart size. No pleural effusion or pneumothorax. Progressive lower lobe predominant interstitial opacities, slightly greater right than left. Lung volumes are mildly low. IMPRESSION: Worsened aeration with progressive bilateral interstitial opacities, most consistent with COVID-19 pneumonia. Electronically Signed   By: Abigail Miyamoto M.D.   On: 04/13/2019 10:30   DG Chest Port 1 View  Result Date: 04/10/2019 CLINICAL DATA:  COVID positive.  Low O2 sats. EXAM: PORTABLE CHEST 1 VIEW COMPARISON:  05/03/2014. FINDINGS: Mediastinum hilar structures normal. Heart size normal. Mild bilateral interstitial prominence suggesting pneumonitis in this known COVID positive patient. No prominent pleural effusion. No pneumothorax. IMPRESSION: Mild bilateral interstitial prominence suggesting pneumonitis in this known COVID positive patient. Electronically Signed   By: Marcello Moores  Register   On: 04/10/2019 13:10    Micro Results     Recent Results (from the past 240 hour(s))  Blood Culture (routine x 2)     Status: None (Preliminary result)   Collection Time: 04/13/19 10:50 AM   Specimen: BLOOD  Result Value Ref Range  Status   Specimen Description   Final    BLOOD RIGHT ANTECUBITAL Performed at [facility] [address] [place], [place] [address]    Special Requests   Final    BOTTLES DRAWN AEROBIC AND ANAEROBIC Blood Culture adequate volume Performed at [place] [facility] [address] [place], [place] [address]    Culture   Final    NO GROWTH 4 DAYS Performed at [facility], [place] [address] [place], [place] [address]    Report Status PENDING  Incomplete  Blood Culture  (routine x 2)     Status: None (Preliminary result)   Collection Time: 04/13/19 11:00 AM   Specimen: BLOOD  Result Value Ref Range Status   Specimen Description   Final    BLOOD LEFT ANTECUBITAL Performed at [facility] [address], [place], [place] [address]    Special Requests   Final    BOTTLES DRAWN AEROBIC AND ANAEROBIC Blood Culture adequate volume Performed at [facility] [address], [place], [place] [address]    Culture   Final    NO GROWTH 4 DAYS Performed at [facility], [place] [address], [place], [place] [address]    Report Status PENDING  Incomplete    Today   Subjective    Sheronda Schiraldi today has no headache,no chest abdominal pain,no new weakness tingling or numbness, feels much better wants to go home today.     Objective   Blood pressure (!) 116/58, pulse 73, temperature 98.7 F (37.1 C), temperature source Oral, resp. rate 18, height '5\' 7"'  (1.702 m), weight 98.9 kg, SpO2 93 %.   Intake/Output Summary (Last 24 hours) at 04/18/2019 1152 Last data filed at 04/18/2019 1000 Gross per 24 hour  Intake 960 ml  Output 1050 ml  Net -90 ml    Exam  Awake Alert, No new F.N deficits, Normal affect .AT,PERRAL Supple Neck,No JVD, No cervical lymphadenopathy appriciated.  Symmetrical Chest wall movement, Good air movement bilaterally, CTAB RRR,No Gallops,Rubs or new Murmurs, No Parasternal Heave +ve B.Sounds, Abd Soft, Non tender, No organomegaly appriciated, No rebound -guarding or rigidity. No Cyanosis, Clubbing or edema, No new Rash or bruise   Data Review   CBC w Diff:  Lab Results  Component Value Date   WBC 9.2 04/18/2019   HGB 11.2 (L) 04/18/2019   HCT 35.0 (L) 04/18/2019   PLT 497 (H) 04/18/2019   LYMPHOPCT 11 04/18/2019   MONOPCT 4 04/18/2019   EOSPCT 0 04/18/2019   BASOPCT 0 04/18/2019    CMP:  Lab Results  Component Value Date   NA 137 04/18/2019   NA 142 01/30/2018   K 4.2 04/18/2019    CL 103 04/18/2019   CO2 25 04/18/2019   BUN 18 04/18/2019   BUN 14 01/30/2018   CREATININE 0.73 04/18/2019   CREATININE 0.72 02/05/2014   PROT 5.9 (L) 04/18/2019   PROT 6.8 02/20/2018   ALBUMIN 2.9 (L) 04/18/2019   ALBUMIN 4.4 02/20/2018   BILITOT 1.2 04/18/2019   BILITOT 0.7 02/20/2018   ALKPHOS 39 04/18/2019   AST 31 04/18/2019   ALT 29 04/18/2019  .   Total Time in preparing paper work, data evaluation and todays exam - 50 minutes  Lala Lund M.D on 04/18/2019 at 11:52 AM  [facility]   Office  [phone]

## 2019-04-18 NOTE — Discharge Instructions (Signed)
Follow with Primary MD Shirlean Mylar, MD in 7 days   Get CBC, CMP, 2 view Chest X ray -  checked next visit within 1 week by Primary MD    Activity: As tolerated with Full fall precautions use walker/cane & assistance as needed  Disposition Home   Diet: Heart Healthy    Special Instructions: If you have smoked or chewed Tobacco  in the last 2 yrs please stop smoking, stop any regular Alcohol  and or any Recreational drug use.  On your next visit with your primary care physician please Get Medicines reviewed and adjusted.  Please request your Prim.MD to go over all Hospital Tests and Procedure/Radiological results at the follow up, please get all Hospital records sent to your Prim MD by signing hospital release before you go home.  If you experience worsening of your admission symptoms, develop shortness of breath, life threatening emergency, suicidal or homicidal thoughts you must seek medical attention immediately by calling 911 or calling your MD immediately  if symptoms less severe.  You Must read complete instructions/literature along with all the possible adverse reactions/side effects for all the Medicines you take and that have been prescribed to you. Take any new Medicines after you have completely understood and accpet all the possible adverse reactions/side effects.       Person Under Monitoring Name: [name]  Location: [address] [place] [address]   Infection Prevention Recommendations for Individuals Confirmed to have, or Being Evaluated for, 2019 Novel Coronavirus (COVID-19) Infection Who Receive Care at Home  Individuals who are confirmed to have, or are being evaluated for, COVID-19 should follow the prevention steps below until a healthcare provider or local or state health department says they can return to normal activities.  Stay home except to get medical care You should restrict activities outside your home, except for getting medical care.  Do not go to work, school, or public areas, and do not use public transportation or taxis.  Call ahead before visiting your doctor Before your medical appointment, call the healthcare provider and tell them that you have, or are being evaluated for, COVID-19 infection. This will help the healthcare provider's office take steps to keep other people from getting infected. Ask your healthcare provider to call the local or state health department.  Monitor your symptoms Seek prompt medical attention if your illness is worsening (e.g., difficulty breathing). Before going to your medical appointment, call the healthcare provider and tell them that you have, or are being evaluated for, COVID-19 infection. Ask your healthcare provider to call the local or state health department.  Wear a facemask You should wear a facemask that covers your nose and mouth when you are in the same room with other people and when you visit a healthcare provider. People who live with or visit you should also wear a facemask while they are in the same room with you.  Separate yourself from other people in your home As much as possible, you should stay in a different room from other people in your home. Also, you should use a separate bathroom, if available.  Avoid sharing household items You should not share dishes, drinking glasses, cups, eating utensils, towels, bedding, or other items with other people in your home. After using these items, you should wash them thoroughly with soap and water.  Cover your coughs and sneezes Cover your mouth and nose with a tissue when you cough or sneeze, or you can cough or sneeze  into your sleeve. Throw used tissues in a lined trash can, and immediately wash your hands with soap and water for at least 20 seconds or use an alcohol-based hand rub.  Wash your [facility] your hands often and thoroughly with soap and water for at least 20 seconds. You can use an alcohol-based  hand sanitizer if soap and water are not available and if your hands are not visibly dirty. Avoid touching your eyes, nose, and mouth with unwashed hands.   Prevention Steps for Caregivers and Household Members of Individuals Confirmed to have, or Being Evaluated for, COVID-19 Infection Being Cared for in the Home  If you live with, or provide care at home for, a person confirmed to have, or being evaluated for, COVID-19 infection please follow these guidelines to prevent infection:  Follow healthcare provider's instructions Make sure that you understand and can help the patient follow any healthcare provider instructions for all care.  Provide for the patient's basic needs You should help the patient with basic needs in the home and provide support for getting groceries, prescriptions, and other personal needs.  Monitor the patient's symptoms If they are getting sicker, call his or her medical provider and tell them that the patient has, or is being evaluated for, COVID-19 infection. This will help the healthcare provider's office take steps to keep other people from getting infected. Ask the healthcare provider to call the local or state health department.  Limit the number of people who have contact with the patient  If possible, have only one caregiver for the patient.  Other household members should stay in another home or place of residence. If this is not possible, they should stay  in another room, or be separated from the patient as much as possible. Use a separate bathroom, if available.  Restrict visitors who do not have an essential need to be in the home.  Keep older adults, very young children, and other sick people away from the patient Keep older adults, very young children, and those who have compromised immune systems or chronic health conditions away from the patient. This includes people with chronic heart, lung, or kidney conditions, diabetes, and  cancer.  Ensure good ventilation Make sure that shared spaces in the home have good air flow, such as from an air conditioner or an opened window, weather permitting.  Wash your hands often  Wash your hands often and thoroughly with soap and water for at least 20 seconds. You can use an alcohol based hand sanitizer if soap and water are not available and if your hands are not visibly dirty.  Avoid touching your eyes, nose, and mouth with unwashed hands.  Use disposable paper towels to dry your hands. If not available, use dedicated cloth towels and replace them when they become wet.  Wear a facemask and gloves  Wear a disposable facemask at all times in the room and gloves when you touch or have contact with the patient's blood, body fluids, and/or secretions or excretions, such as sweat, saliva, sputum, nasal mucus, vomit, urine, or feces.  Ensure the mask fits over your nose and mouth tightly, and do not touch it during use.  Throw out disposable facemasks and gloves after using them. Do not reuse.  Wash your hands immediately after removing your facemask and gloves.  If your personal clothing becomes contaminated, carefully remove clothing and launder. Wash your hands after handling contaminated clothing.  Place all used disposable facemasks, gloves, and other waste in  a lined container before disposing them with other household waste.  Remove gloves and wash your hands immediately after handling these items.  Do not share dishes, glasses, or other household items with the patient  Avoid sharing household items. You should not share dishes, drinking glasses, cups, eating utensils, towels, bedding, or other items with a patient who is confirmed to have, or being evaluated for, COVID-19 infection.  After the person uses these items, you should wash them thoroughly with soap and water.  Wash laundry thoroughly  Immediately remove and wash clothes or bedding that have blood, body  fluids, and/or secretions or excretions, such as sweat, saliva, sputum, nasal mucus, vomit, urine, or feces, on them.  Wear gloves when handling laundry from the patient.  Read and follow directions on labels of laundry or clothing items and detergent. In general, wash and dry with the warmest temperatures recommended on the label.  Clean all areas the individual has used often  Clean all touchable surfaces, such as counters, tabletops, doorknobs, bathroom fixtures, toilets, phones, keyboards, tablets, and bedside tables, every day. Also, clean any surfaces that may have blood, body fluids, and/or secretions or excretions on them.  Wear gloves when cleaning surfaces the patient has come in contact with.  Use a diluted bleach solution (e.g., dilute bleach with 1 part bleach and 10 parts water) or a household disinfectant with a label that says EPA-registered for coronaviruses. To make a bleach solution at home, add 1 tablespoon of bleach to 1 quart (4 cups) of water. For a larger supply, add  cup of bleach to 1 gallon (16 cups) of water.  Read labels of cleaning products and follow recommendations provided on product labels. Labels contain instructions for safe and effective use of the cleaning product including precautions you should take when applying the product, such as wearing gloves or eye protection and making sure you have good ventilation during use of the product.  Remove gloves and wash hands immediately after cleaning.  Monitor yourself for signs and symptoms of illness Caregivers and household members are considered close contacts, should monitor their health, and will be asked to limit movement outside of the home to the extent possible. Follow the monitoring steps for close contacts listed on the symptom monitoring form.   ? If you have additional questions, contact your local health department or call the epidemiologist on call at [phone] (available 24/7). ? This  guidance is subject to change. For the most up-to-date guidance from [facility], please refer to their website: [email or web address]

## 2019-04-18 NOTE — Progress Notes (Addendum)
Occupational Therapy Evaluation/Discharge Patient Details Name: [name] MRN: [id number] DOB: 12/27/1960 Today's Date: 04/18/2019    History of Present Illness 59 y.o. female with medical history significant of essential hypertension, frequent PVCs, HLD who presents from the [facility] infusion center after being found hypoxic, oxygenating 70-80% on room air.  Patient with positive Covid-19 infection on 04/10/2019 and was at the [facility] infusion center on 1/25 bamlanivimab infusion when she was found to be hypoxic and sent to the ED. She was requiring 3 L of O2. She has had COVID symptoms for at least 1 week now.  She  reports loss of taste/smell, diarrhea and a poor appetite.   Clinical Impression   Patient up in chair on arrival.  Very pleasant and agreeable to therapy. She is independent at prior level, takes care of her grandchild and lives with husband.  Patient was independent with mobility and ADLs during session.  She was on 2L O2 with SpO2 at 95 when resting and 84 with mobility/prolonged standing at sink.  With rest and pursed lip breathing she recovered in <1 min.  Educated patient on energy conservation techniques for when she is home, as well as theraband exercises and ways to increase activity tolerance.  Patient is being discharged home today with family.  OT will sign off.     Follow Up Recommendations  No OT follow up    Equipment Recommendations  None recommended by OT    Recommendations for Other Services       Precautions / Restrictions Precautions Precautions: None Restrictions Weight Bearing Restrictions: No      Mobility Bed Mobility               General bed mobility comments: Pt up in chair at arrival  Transfers Overall transfer level: Independent Equipment used: None                  Balance Overall balance assessment: No apparent balance deficits (not formally assessed)                                          ADL either performed or assessed with clinical judgement   ADL Overall ADL's : Needs assistance/impaired Eating/Feeding: Independent   Grooming: Wash/dry hands;Wash/dry face;Independent;Standing   Upper Body Bathing: Independent   Lower Body Bathing: Supervison/ safety   Upper Body Dressing : Independent   Lower Body Dressing: Independent   Toilet Transfer: Independent   Toileting- Clothing Manipulation and Hygiene: Independent       Functional mobility during ADLs: Independent       Vision         Perception     Praxis      Pertinent Vitals/Pain Pain Assessment: No/denies pain     Hand Dominance     Extremity/Trunk Assessment Upper Extremity Assessment Upper Extremity Assessment: Overall WFL for tasks assessed           Communication Communication Communication: No difficulties   Cognition Arousal/Alertness: Awake/alert Behavior During Therapy: WFL for tasks assessed/performed Overall Cognitive Status: Within Functional Limits for tasks assessed                                     General Comments       Exercises Exercises: Other exercises Other Exercises Other Exercises: x10  fluttwe vavle Other Exercises: x10 incentive spirometer   Shoulder Instructions      Home Living Family/patient expects to be discharged to:: Private residence Living Arrangements: Spouse/significant other Available Help at Discharge: Family Type of Home: House       Home Layout: One level     Bathroom Shower/Tub: Teacher, early years/pre: Standard     Home Equipment: None          Prior Functioning/Environment Level of Independence: Independent                 OT Problem List: Decreased activity tolerance;Cardiopulmonary status limiting activity      OT Treatment/Interventions:      OT Goals(Current goals can be found in the care plan section) Acute Rehab OT Goals Patient Stated Goal: To go home OT Goal  Formulation: With patient Time For Goal Achievement: 05/02/19 Potential to Achieve Goals: Good  OT Frequency:     Barriers to D/C:            Co-evaluation              AM-PAC OT "6 Clicks" Daily Activity     Outcome Measure Help from another person eating meals?: None Help from another person taking care of personal grooming?: None Help from another person toileting, which includes using toliet, bedpan, or urinal?: None Help from another person bathing (including washing, rinsing, drying)?: A Little Help from another person to put on and taking off regular upper body clothing?: None Help from another person to put on and taking off regular lower body clothing?: None 6 Click Score: 23   End of Session Equipment Utilized During Treatment: Oxygen Nurse Communication: Mobility status  Activity Tolerance: Patient tolerated treatment well Patient left: in chair;with call bell/phone within reach  OT Visit Diagnosis: Other (comment)(Cardiopulminary limitations)                Time: 1448-1856 OT Time Calculation (min): 28 min Charges:  OT General Charges $OT Visit: 1 Visit OT Evaluation $OT Eval Moderate Complexity: 1 Mod OT Treatments $Self Care/Home Management : 8-22 mins  August Luz, OTR/L   Phylliss Bob 04/18/2019, 2:48 PM

## 2019-04-23 ENCOUNTER — Encounter (HOSPITAL_BASED_OUTPATIENT_CLINIC_OR_DEPARTMENT_OTHER): Payer: BLUE CROSS/BLUE SHIELD | Admitting: Cardiology

## 2019-06-22 ENCOUNTER — Other Ambulatory Visit: Payer: Self-pay

## 2019-06-22 ENCOUNTER — Ambulatory Visit (HOSPITAL_BASED_OUTPATIENT_CLINIC_OR_DEPARTMENT_OTHER): Payer: BLUE CROSS/BLUE SHIELD | Attending: Cardiology | Admitting: Cardiology

## 2019-06-22 DIAGNOSIS — G4736 Sleep related hypoventilation in conditions classified elsewhere: Secondary | ICD-10-CM | POA: Diagnosis not present

## 2019-06-22 DIAGNOSIS — R0683 Snoring: Secondary | ICD-10-CM | POA: Diagnosis not present

## 2019-06-22 DIAGNOSIS — I10 Essential (primary) hypertension: Secondary | ICD-10-CM | POA: Diagnosis not present

## 2019-06-22 DIAGNOSIS — G4733 Obstructive sleep apnea (adult) (pediatric): Secondary | ICD-10-CM | POA: Diagnosis not present

## 2019-06-22 DIAGNOSIS — G4734 Idiopathic sleep related nonobstructive alveolar hypoventilation: Secondary | ICD-10-CM

## 2019-06-22 DIAGNOSIS — R0902 Hypoxemia: Secondary | ICD-10-CM | POA: Diagnosis not present

## 2019-06-28 NOTE — Procedures (Signed)
\     Patient Name: [name], [name] Date: 06/22/2019 Gender: Female D.O.B: 15-May-1960 Age (years): 28 Referring Provider: Armanda Magic MD, ABSM Height (inches): 64 Interpreting Physician: Armanda Magic MD, ABSM Weight (lbs): 222 RPSGT: [place] Sink BMI: 38 MRN: [id number] Neck Size: 16.00  CLINICAL INFORMATION Sleep Study Type: HST  Indication for sleep study: N/A  Epworth Sleepiness Score: 5  SLEEP STUDY TECHNIQUE A multi-channel overnight portable sleep study was performed. The channels recorded were: nasal airflow, thoracic respiratory movement, and oxygen saturation with a pulse oximetry. Snoring was also monitored.  MEDICATIONS Patient self administered medications include: N/A.  SLEEP ARCHITECTURE Patient was studied for 500.2 minutes. The sleep efficiency was 99.1 % and the patient was supine for 91.9%. The arousal index was 0.0 per hour.  RESPIRATORY PARAMETERS The overall AHI was 12.7 per hour, with a central apnea index of 0.0 per hour.  The oxygen nadir was 76% during sleep.  CARDIAC DATA Mean heart rate during sleep was 59.7 bpm.  IMPRESSIONS - Mild obstructive sleep apnea occurred during this study (AHI = 12.7/h). - No significant central sleep apnea occurred during this study (CAI = 0.0/h). - Severe oxygen desaturation was noted during this study (Min O2 = 76%). - Patient snored 6.0% during the sleep.  DIAGNOSIS - Obstructive Sleep Apnea (327.23 [G47.33 ICD-10]) - Nocturnal Hypoxemia (327.26 [G47.36 ICD-10])  RECOMMENDATIONS - Therapeutic CPAP titration to determine optimal pressure required to alleviate sleep disordered breathing. - Avoid alcohol, sedatives and other CNS depressants that may worsen sleep apnea and disrupt normal sleep architecture. - Sleep hygiene should be reviewed to assess factors that may improve sleep quality. - Weight management and regular exercise should be initiated or continued.  [Electronically signed] 06/28/2019  04:51 PM  Armanda Magic MD, ABSM Diplomate, American Board of Sleep Medicine

## 2019-06-30 ENCOUNTER — Telehealth: Payer: Self-pay | Admitting: *Deleted

## 2019-06-30 DIAGNOSIS — G4733 Obstructive sleep apnea (adult) (pediatric): Secondary | ICD-10-CM

## 2019-06-30 NOTE — Telephone Encounter (Signed)
Informed patient of sleep study results and patient understanding was verbalized. Patient understands her sleep study showed they have sleep apnea and recommend CPAP titration. Please set up titration in the sleep lab.  Pt is aware and agreeable to her results.  Titration sent to sleep pool 

## 2019-06-30 NOTE — Telephone Encounter (Signed)
-----   Message from Quintella Reichert, MD sent at 06/28/2019  4:53 PM EDT ----- Please let patient know that they have sleep apnea and recommend CPAP titration. Please set up titration in the sleep lab.

## 2019-08-31 ENCOUNTER — Telehealth: Payer: Self-pay | Admitting: *Deleted

## 2019-08-31 ENCOUNTER — Other Ambulatory Visit: Payer: Self-pay | Admitting: Cardiology

## 2019-08-31 DIAGNOSIS — IMO0002 Reserved for concepts with insufficient information to code with codable children: Secondary | ICD-10-CM

## 2019-08-31 DIAGNOSIS — G4733 Obstructive sleep apnea (adult) (pediatric): Secondary | ICD-10-CM

## 2019-08-31 NOTE — Telephone Encounter (Signed)
Staff message sent to Leslye Peer denied CPAP titration. Approved APAP. Notify ordering provider. Auth sheet faxed to [facility] [place] to send to [facility] with order.,

## 2019-09-02 NOTE — Telephone Encounter (Signed)
  Gaynelle Cage, CMA  [name], CMA [employer] denied CPAP titration approved APAP. I will fax approval sheet to you.          ----- Message -----  From: [name], CMA  Sent: 06/30/2019 12:08 PM EDT  To: Cv Div Sleep Studies  Subject: precert                      recommend CPAP titration

## 2019-09-03 NOTE — Addendum Note (Signed)
Addended by: [name] on: 09/03/2019 04:22 PM   Modules accepted: Orders

## 2019-09-03 NOTE — Telephone Encounter (Addendum)
DME selection is [facility]. Patient understands she will be contacted by [facility] to set up her cpap. Patient understands to call if [facility] does not contact her with new setup in a timely manner. Patient understands they will be called once confirmation has been received from [facility] that they have received their new machine to schedule 10 week follow up appointment.  [facility] notified of new cpap order  Please add to airview Patient was grateful for the call and thanked me.

## 2019-09-03 NOTE — Telephone Encounter (Signed)
RE: settings Amy Reichert, MD  [name], CMA Order ResMed CPAP on auto from 4 to 20cm H2O with heated humidity and mask of [facility] and get download in 2 weeks and followup with me in 8 weeks   Traci

## 2019-10-23 ENCOUNTER — Other Ambulatory Visit: Payer: Self-pay | Admitting: Family Medicine

## 2019-10-23 DIAGNOSIS — E2839 Other primary ovarian failure: Secondary | ICD-10-CM

## 2019-12-09 ENCOUNTER — Other Ambulatory Visit: Payer: Self-pay

## 2019-12-09 ENCOUNTER — Ambulatory Visit: Payer: 59 | Admitting: Internal Medicine

## 2019-12-09 ENCOUNTER — Encounter: Payer: Self-pay | Admitting: Internal Medicine

## 2019-12-09 VITALS — BP 132/74 | HR 67 | Ht 64.0 in | Wt 221.2 lb

## 2019-12-09 DIAGNOSIS — I493 Ventricular premature depolarization: Secondary | ICD-10-CM

## 2019-12-09 DIAGNOSIS — I10 Essential (primary) hypertension: Secondary | ICD-10-CM | POA: Diagnosis not present

## 2019-12-09 NOTE — Progress Notes (Signed)
HPI [name] returns today for ongoing evaluation and management of her PVC's. She is a pleasant 59 yo woman with above who has had nice control of her symptoms on flecainide and toprol. In the interim, she has contracted Covid and was in the hospital for 5 days. She has recovered. She is no longer on eliquis. Allergies  Allergen Reactions  . Buprenorphine Hcl Itching  . Morphine And Related Itching  . Other   . Short Ragweed Pollen Ext Hives     Current Outpatient Medications  Medication Sig Dispense Refill  . acetaminophen (TYLENOL) 325 MG tablet Take 325 mg by mouth every 6 (six) hours as needed for mild pain or headache.    Marland Kitchen apixaban (ELIQUIS) 2.5 MG TABS tablet Take 1 tablet (2.5 mg total) by mouth 2 (two) times daily. 28 tablet 0  . aspirin 81 MG chewable tablet Chew 1 tablet (81 mg total) by mouth daily.    Marland Kitchen atorvastatin (LIPITOR) 40 MG tablet Take 1 tablet by mouth once daily 90 tablet 3  . cholecalciferol (VITAMIN D) 1000 UNITS tablet Take 1,000 Units by mouth daily.    Marland Kitchen estradiol (VIVELLE-DOT) 0.05 MG/24HR patch Place 1 patch onto the skin 2 (two) times a week.   0  . flecainide (TAMBOCOR) 150 MG tablet Take 0.5 tablets (75 mg total) by mouth 2 (two) times daily. 90 tablet 3  . hydrochlorothiazide (HYDRODIURIL) 25 MG tablet Take 25 mg by mouth daily.   0  . methylPREDNISolone (MEDROL DOSEPAK) 4 MG TBPK tablet follow package directions 21 tablet 0  . metoprolol succinate (TOPROL-XL) 50 MG 24 hr tablet Take 1 tablet (50 mg total) by mouth daily. Take with or immediately following a meal. 90 tablet 3  . pantoprazole (PROTONIX) 40 MG tablet Take 1 tablet (40 mg total) by mouth daily. 30 tablet 0  . potassium chloride 20 MEQ TBCR Take 20 mEq by mouth daily. 30 tablet 3  . PROAIR HFA 108 (90 Base) MCG/ACT inhaler Inhale 2 puffs into the lungs as needed for shortness of breath.  5  . promethazine-codeine (PHENERGAN WITH CODEINE) 6.25-10 MG/5ML syrup Take 5 mLs by mouth every  6 (six) hours as needed for cough.     No current facility-administered medications for this visit.     Past Medical History:  Diagnosis Date  . Hyperlipidemia   . Hypertension   . Palpitations   . PVC (premature ventricular contraction)     ROS:   All systems reviewed and negative except as noted in the HPI.   Past Surgical History:  Procedure Laterality Date  . APPENDECTOMY    . carpal tunn    . CESAREAN SECTION    . NEUROMA SURGERY    . SEPTOPLASTY    . TONSILLECTOMY    . TOTAL ABDOMINAL HYSTERECTOMY W/ BILATERAL SALPINGOOPHORECTOMY    . TUBAL LIGATION       Family History  Problem Relation Age of Onset  . Hyperlipidemia Sister   . Hyperlipidemia Brother      Social History   Socioeconomic History  . Marital status: Married    Spouse name: Not on file  . Number of children: Not on file  . Years of education: Not on file  . Highest education level: Not on file  Occupational History  . Not on file  Tobacco Use  . Smoking status: Never Smoker  . Smokeless tobacco: Never Used  Vaping Use  . Vaping Use: Never used  Substance and Sexual Activity  . Alcohol use: Yes    Alcohol/week: 0.0 standard drinks    Comment: occsaionl wine  . Drug use: Never  . Sexual activity: Not on file  Other Topics Concern  . 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   Social Determinants of Health   Financial Resource Strain:   . Difficulty of Paying Living Expenses: Not on file  Food Insecurity:   . Worried About Programme researcher, broadcasting/film/video in the Last Year: Not on file  . Ran Out of Food in the Last Year: Not on file  Transportation  Needs:   . Lack of Transportation (Medical): Not on file  . Lack of Transportation (Non-Medical): Not on file  Physical Activity:   . Days of Exercise per Week: Not on file  . Minutes of Exercise per Session: Not on file  Stress:   . Feeling of Stress : Not on file  Social Connections:   . Frequency of Communication with Friends and Family: Not on file  . Frequency of Social Gatherings with Friends and Family: Not on file  . Attends Religious Services: Not on file  . Active Member of Clubs or Organizations: Not on file  . Attends Banker Meetings: Not on file  . Marital Status: Not on file  Intimate Partner Violence:   . Fear of Current or Ex-Partner: Not on file  . Emotionally Abused: Not on file  . Physically Abused: Not on file  . Sexually Abused: Not on file     Ht 5\' 4"  (1.626 m)   BMI 38.11 kg/m   Physical Exam:  Well appearing NAD HEENT: Unremarkable Neck:  No JVD, no thyromegally Lymphatics:  No adenopathy Back:  No CVA tenderness Lungs:  Clear with no wheezes HEART:  Regular rate rhythm, no murmurs, no rubs, no clicks Abd:  soft, positive bowel sounds, no organomegally, no rebound, no guarding Ext:  2 plus pulses, no edema, no cyanosis, no clubbing Skin:  No rashes no nodules Neuro:  CN II through XII intact, motor grossly intact  EKG - nsr   Assess/Plan: 1. PVC' s - she has had none. She will continue flecainide and metoprolol 2. Obesity - I encouraged her to lose weight.  3. HTN -her bp is controlled. 4. Covid -19 - she has recovered and has not had any long term symptoms.  Tyde Lamison,MD

## 2019-12-09 NOTE — Patient Instructions (Addendum)
Medication Instructions:  Your physician recommends that you continue on your current medications as directed. Please refer to the Current Medication list given to you today.  Labwork: None ordered.  Testing/Procedures: None ordered.  Follow-Up: Your physician wants you to follow-up in: two year with Dr. Taylor.   You will receive a reminder letter in the mail two months in advance. If you don't receive a letter, please call our office to schedule the follow-up appointment.  Any Other Special Instructions Will Be Listed Below (If Applicable).  If you need a refill on your cardiac medications before your next appointment, please call your pharmacy.   

## 2020-01-25 ENCOUNTER — Other Ambulatory Visit: Payer: Self-pay

## 2020-01-25 ENCOUNTER — Other Ambulatory Visit: Payer: Self-pay | Admitting: Internal Medicine

## 2020-01-25 ENCOUNTER — Ambulatory Visit
Admission: RE | Admit: 2020-01-25 | Discharge: 2020-01-25 | Disposition: A | Discharge status: Home / Self Care | Payer: 59 | Source: Ambulatory Visit | Attending: Family Medicine | Admitting: Family Medicine

## 2020-01-25 DIAGNOSIS — E2839 Other primary ovarian failure: Secondary | ICD-10-CM

## 2020-02-18 ENCOUNTER — Other Ambulatory Visit: Payer: Self-pay | Admitting: Cardiology

## 2020-02-22 ENCOUNTER — Telehealth: Payer: Self-pay | Admitting: Cardiology

## 2020-02-22 MED ORDER — ATORVASTATIN CALCIUM 40 MG PO TABS: 40.0000 mg | ORAL_TABLET | Freq: Every day | ORAL | 0 refills | Status: DC

## 2020-02-22 NOTE — Telephone Encounter (Signed)
Pt's medication was sent to pt's pharmacy as requested. Confirmation received.  °

## 2020-02-22 NOTE — Telephone Encounter (Signed)
*  STAT* If patient is at the pharmacy, call can be transferred to refill team.   1. Which medications need to be refilled? (please list name of each medication and dose if known) atorvastatin (LIPITOR) 40 MG tablet  2. Which pharmacy/location (including street and city if local pharmacy) is medication to be sent to? [facility] [address] [place], [place] - [address]  3. Do they need a 30 day or 90 day supply? 90 day   Patient has an appointment 03/25/2020

## 2020-03-18 ENCOUNTER — Other Ambulatory Visit: Payer: Self-pay | Admitting: Internal Medicine

## 2020-03-25 ENCOUNTER — Encounter: Payer: Self-pay | Admitting: Cardiology

## 2020-03-25 ENCOUNTER — Other Ambulatory Visit: Payer: Self-pay

## 2020-03-25 ENCOUNTER — Ambulatory Visit (INDEPENDENT_AMBULATORY_CARE_PROVIDER_SITE_OTHER): Payer: 59 | Admitting: Cardiology

## 2020-03-25 ENCOUNTER — Telehealth: Payer: Self-pay | Admitting: *Deleted

## 2020-03-25 VITALS — BP 128/70 | HR 63 | Ht 64.0 in | Wt 206.0 lb

## 2020-03-25 DIAGNOSIS — K219 Gastro-esophageal reflux disease without esophagitis: Secondary | ICD-10-CM

## 2020-03-25 DIAGNOSIS — E785 Hyperlipidemia, unspecified: Secondary | ICD-10-CM | POA: Diagnosis not present

## 2020-03-25 DIAGNOSIS — G4733 Obstructive sleep apnea (adult) (pediatric): Secondary | ICD-10-CM

## 2020-03-25 DIAGNOSIS — I10 Essential (primary) hypertension: Secondary | ICD-10-CM

## 2020-03-25 DIAGNOSIS — I493 Ventricular premature depolarization: Secondary | ICD-10-CM

## 2020-03-25 MED ORDER — FAMOTIDINE 20 MG PO TABS: 20.0000 mg | ORAL_TABLET | Freq: Every day | ORAL | 3 refills | Status: DC

## 2020-03-25 MED ORDER — ATORVASTATIN CALCIUM 40 MG PO TABS: 40.0000 mg | ORAL_TABLET | Freq: Every day | ORAL | 3 refills | Status: DC

## 2020-03-25 NOTE — Addendum Note (Signed)
Addended by: Theresia Majors on: 03/25/2020 09:27 AM   Modules accepted: Orders

## 2020-03-25 NOTE — Telephone Encounter (Signed)
Order placed to [facility] 

## 2020-03-25 NOTE — Patient Instructions (Signed)
Medication Instructions:  Your physician has recommended you make the following change in your medication:  1) START taking Pepcid 20 mg daily  *If you need a refill on your cardiac medications before your next appointment, please call your pharmacy*  Testing/Procedures: Your provider has requested that you wear and overnight pulse oximeter in room air.  Follow-Up: At [facility], you and your health needs are our priority.  As part of our continuing mission to provide you with exceptional heart care, we have created designated Provider Care Teams.  These Care Teams include your primary Cardiologist (physician) and Advanced Practice Providers (APPs -  Physician Assistants and Nurse Practitioners) who all work together to provide you with the care you need, when you need it.  Your next appointment:   1 year(s)  The format for your next appointment:   In Person  Provider:   You may see Armanda Magic, MD or one of the following Advanced Practice Providers on your designated Care Team:    Ronie Spies, PA-C  Jacolyn Reedy, PA-C  Other Instructions You have been referred to the [facility]

## 2020-03-25 NOTE — Progress Notes (Signed)
Cardiology Office Note:    Date:  03/25/2020   ID:  [name], DOB [birth year], MRN [id number]  PCP:  Shirlean Mylar, MD  Cardiologist:  Armanda Magic, MD    Referring MD: Shirlean Mylar, MD   Chief Complaint  Patient presents with  . Hypertension  . Sleep Apnea  . Hyperlipidemia    History of Present Illness:    [name] is a 60 y.o. female with a hx of HTN, dyslipidemia and palpitationsrelated to PVCs while on estrogen therapy. This improved with reduction of HRT. She had a 2D echo in 2014 that showed normal LVEF at 65%, trivial AI and trivial MR with grade 1 DD. She is on medical therapy for her HTN and DLD with Toprol , HCTZ and simvastatin. Both chronic conditions are followed by her PCP. Dr. Okey Regal Web.  She was seen by Dr. Ladona Ridgel with EP in Sept for her PVCs and is on Flecainide and metoprolol for suppression.   She had a home sleep study April 2021 showing mild OSA with an AHI of 12.7/hr and O2 sats as low as 76%.  She never started the CPAP due to insurance problems and feels that she is ok and would prefer to hold off on starting PAP.  She tells me that she has been having chest pain at nighttime or after she eats and thinks that it is GERD.  She will take TUMS and it resolves.  There is no associated sx of diaphoresis, nausea, SOB or radiation of the discomfort and it never occurs with exertion. She has gained some weight and has noticed that she is SOB with activity more.  She denies any PND, orthopnea, LE edema, dizziness, palpitations or syncope. She is compliant with her meds and is tolerating meds with no SE.    Past Medical History:  Diagnosis Date  . Hyperlipidemia   . Hypertension   . Palpitations   . PVC (premature ventricular contraction)     Past Surgical History:  Procedure Laterality Date  . APPENDECTOMY    . carpal tunn    . CESAREAN SECTION    . NEUROMA SURGERY    . SEPTOPLASTY    . TONSILLECTOMY    . TOTAL ABDOMINAL HYSTERECTOMY W/  BILATERAL SALPINGOOPHORECTOMY    . TUBAL LIGATION      Current Medications: Current Meds  Medication Sig  . acetaminophen (TYLENOL) 325 MG tablet Take 325 mg by mouth every 6 (six) hours as needed for mild pain or headache.  Marland Kitchen aspirin 81 MG chewable tablet Chew 1 tablet (81 mg total) by mouth daily.  Marland Kitchen atorvastatin (LIPITOR) 40 MG tablet Take 1 tablet (40 mg total) by mouth daily. Please keep upcoming appt in January 2022 with Dr. Mayford Knife before anymore refills. Thank you  . cholecalciferol (VITAMIN D) 1000 UNITS tablet Take 1,000 Units by mouth daily.  Marland Kitchen estradiol (VIVELLE-DOT) 0.05 MG/24HR patch Place 1 patch onto the skin 2 (two) times a week.   . flecainide (TAMBOCOR) 150 MG tablet Take 1/2 (one-half) tablet by mouth twice daily  . hydrochlorothiazide (HYDRODIURIL) 25 MG tablet Take 25 mg by mouth daily.   . metoprolol succinate (TOPROL-XL) 50 MG 24 hr tablet TAKE 1 TABLET BY MOUTH ONCE DAILY **TAKE  WITH  OR  IMMEDIATELY  FOLLOWING  A  MEAL  . pantoprazole (PROTONIX) 40 MG tablet Take 1 tablet (40 mg total) by mouth daily.  . potassium chloride 20 MEQ TBCR Take 20 mEq by mouth daily.  Marland Kitchen  PROAIR HFA 108 (90 Base) MCG/ACT inhaler Inhale 2 puffs into the lungs as needed for shortness of breath.     Allergies:   Buprenorphine hcl, Morphine and related, Morphine sulfate, Other, and Short ragweed pollen ext   Social History   Socioeconomic History  . Marital status: Married    Spouse name: Not on file  . Number of children: Not on file  . Years of education: Not on file  . Highest education level: Not on file  Occupational History  . Not on file  Tobacco Use  . Smoking status: Never Smoker  . Smokeless tobacco: Never Used  Vaping Use  . Vaping Use: Never used  Substance and Sexual Activity  . Alcohol use: Yes    Alcohol/week: 0.0 standard drinks    Comment: occsaionl wine  . Drug use: Never  . Sexual activity: Not on file  Other Topics Concern  . 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   Social Determinants of Health   Financial Resource Strain: Not on file  Food Insecurity: Not on file  Transportation Needs: Not on file  Physical Activity: Not on file  Stress: Not on file  Social Connections: Not on file     Family History: The patient's family history includes Hyperlipidemia in her brother and sister.  ROS:   Please see the history of present illness.    ROS  All other systems reviewed and negative.   EKGs/Labs/Other Studies Reviewed:    The following studies were reviewed today: none  EKG:  EKG is not  ordered today.    Recent Labs: 04/13/2019: Magnesium 2.0 04/18/2019: ALT 29; BUN 18; Creatinine, Ser 0.73; Hemoglobin 11.2; Platelets 497; Potassium 4.2; Sodium 137   Recent Lipid Panel    Component Value Date/Time   CHOL 145 12/23/2018 1322   TRIG 94 04/13/2019 1050   HDL 44 12/23/2018 1322   CHOLHDL 3.3 12/23/2018 1322   LDLCALC 86 12/23/2018 1322    Physical Exam:    VS:  BP 128/70   Pulse 63   Ht 5\' 4"  (1.626 m)   Wt 206 lb (93.4 kg)   SpO2 98%   BMI 35.36 kg/m     Wt Readings from Last 3 Encounters:  03/25/20 206 lb (93.4 kg)  12/09/19 221 lb 3.2 oz (100.3 kg)  06/22/19 222 lb (100.7 kg)     GEN: Well nourished, well developed in no acute distress HEENT: Normal NECK: No JVD; No carotid bruits LYMPHATICS: No lymphadenopathy CARDIAC:RRR, no murmurs, rubs, gallops RESPIRATORY:  Clear to auscultation without rales, wheezing or rhonchi  ABDOMEN: Soft, non-tender, non-distended MUSCULOSKELETAL:  No edema; No deformity  SKIN: Warm and dry NEUROLOGIC:  Alert and oriented x  3 PSYCHIATRIC:  Normal affect    ASSESSMENT:    1. Benign essential HTN   2. PVC (premature ventricular contraction)   3. Dyslipidemia   4. OSA (obstructive sleep apnea)   5. Gastroesophageal reflux disease, unspecified whether esophagitis present    PLAN:    In order of  problems listed above:  1.  HTN -BP is well controlled on exam today -continue HCTZ 25mg  daily and Toprol XL 50mg  daily -SCr stable at 0.74 in Aug 2021 with K+ 3.9  2 . PVCs -she is followed by EP -palpitations are controlled on BB and flecainide 75mg  BID -denies any palpitations  3.  HLD -LDL goal <100 -LDL was 108 in Aug 2021 -encouraged her to watch the fats in her diet and eat more fruits and veggies -continue atorvastatin 40mg  daily  4.  OSA -  -she had mild OSA on sleep study  -she would prefer not to be on PAP and really is asymptomatic -I am concerned that she had some nocturnal hypoxemia on HST but cannot differentiate 88% from 89% on the study -I will get an overnight Pulse ox and if she has O2 sats < 88% for more than 5 min then would recommend treatment of her OSA with PAP therapy  5.  GERD -she has chest discomfort after eating and resolves with TUMS -recommend starting Pepcid 20mg  daily and followup with PCP  Followup with me in 1 year  Medication Adjustments/Labs and Tests Ordered: Current medicines are reviewed at length with the patient today.  Concerns regarding medicines are outlined above.  No orders of the defined types were placed in this encounter.  No orders of the defined types were placed in this encounter.   Signed, Sep 2021, MD  03/25/2020 9:22 AM    [facility]

## 2020-03-25 NOTE — Telephone Encounter (Signed)
-----   Message from Theresia Majors, RN sent at 03/25/2020  9:35 AM EST ----- Overnight pulse oximeter has been ordered for the patient to wear on room air. Could you help me get it ordered? She does not have a DME so not sure how to go about getting it for her.  Thanks!

## 2020-06-16 ENCOUNTER — Ambulatory Visit (INDEPENDENT_AMBULATORY_CARE_PROVIDER_SITE_OTHER): Payer: Self-pay | Admitting: Bariatrics

## 2020-07-04 ENCOUNTER — Other Ambulatory Visit: Payer: Self-pay | Admitting: Internal Medicine

## 2020-07-20 ENCOUNTER — Ambulatory Visit (INDEPENDENT_AMBULATORY_CARE_PROVIDER_SITE_OTHER): Payer: 59 | Admitting: Family Medicine

## 2020-07-20 ENCOUNTER — Other Ambulatory Visit: Payer: Self-pay

## 2020-07-20 ENCOUNTER — Encounter (INDEPENDENT_AMBULATORY_CARE_PROVIDER_SITE_OTHER): Payer: Self-pay | Admitting: Family Medicine

## 2020-07-20 VITALS — BP 148/77 | HR 71 | Temp 98.2°F | Ht 65.0 in | Wt 228.0 lb

## 2020-07-20 DIAGNOSIS — I10 Essential (primary) hypertension: Secondary | ICD-10-CM

## 2020-07-20 DIAGNOSIS — R5383 Other fatigue: Secondary | ICD-10-CM | POA: Diagnosis not present

## 2020-07-20 DIAGNOSIS — E66812 Obesity, class 2: Secondary | ICD-10-CM

## 2020-07-20 DIAGNOSIS — R0602 Shortness of breath: Secondary | ICD-10-CM | POA: Diagnosis not present

## 2020-07-20 DIAGNOSIS — E559 Vitamin D deficiency, unspecified: Secondary | ICD-10-CM

## 2020-07-20 DIAGNOSIS — R739 Hyperglycemia, unspecified: Secondary | ICD-10-CM

## 2020-07-20 DIAGNOSIS — Z0289 Encounter for other administrative examinations: Secondary | ICD-10-CM

## 2020-07-20 DIAGNOSIS — Z6838 Body mass index (BMI) 38.0-38.9, adult: Secondary | ICD-10-CM

## 2020-07-20 DIAGNOSIS — Z1331 Encounter for screening for depression: Secondary | ICD-10-CM

## 2020-07-20 DIAGNOSIS — E782 Mixed hyperlipidemia: Secondary | ICD-10-CM

## 2020-07-20 DIAGNOSIS — Z9189 Other specified personal risk factors, not elsewhere classified: Secondary | ICD-10-CM | POA: Diagnosis not present

## 2020-07-20 DIAGNOSIS — I493 Ventricular premature depolarization: Secondary | ICD-10-CM

## 2020-07-21 LAB — CBC WITH DIFFERENTIAL
Basophils Absolute: 0.1 10*3/uL (ref 0.0–0.2)
Basos: 1 %
EOS (ABSOLUTE): 0.4 10*3/uL (ref 0.0–0.4)
Eos: 5 %
Hematocrit: 39.7 % (ref 34.0–46.6)
Hemoglobin: 13.3 g/dL (ref 11.1–15.9)
Immature Grans (Abs): 0 10*3/uL (ref 0.0–0.1)
Immature Granulocytes: 0 %
Lymphocytes Absolute: 1.5 10*3/uL (ref 0.7–3.1)
Lymphs: 19 %
MCH: 30.4 pg (ref 26.6–33.0)
MCHC: 33.5 g/dL (ref 31.5–35.7)
MCV: 91 fL (ref 79–97)
Monocytes Absolute: 0.6 10*3/uL (ref 0.1–0.9)
Monocytes: 7 %
Neutrophils Absolute: 5.3 10*3/uL (ref 1.4–7.0)
Neutrophils: 68 %
RBC: 4.38 x10E6/uL (ref 3.77–5.28)
RDW: 12 % (ref 11.7–15.4)
WBC: 7.8 10*3/uL (ref 3.4–10.8)

## 2020-07-21 LAB — LIPID PANEL WITH LDL/HDL RATIO
Cholesterol, Total: 194 mg/dL (ref 100–199)
HDL: 55 mg/dL (ref >39)
LDL Chol Calc (NIH): 123 mg/dL — ABNORMAL HIGH (ref 0–99)
LDL/HDL Ratio: 2.2 ratio (ref 0.0–3.2)
Triglycerides: 87 mg/dL (ref 0–149)
VLDL Cholesterol Cal: 16 mg/dL (ref 5–40)

## 2020-07-21 LAB — COMPREHENSIVE METABOLIC PANEL
ALT: 27 IU/L (ref 0–32)
AST: 15 IU/L (ref 0–40)
Albumin/Globulin Ratio: 1.5 (ref 1.2–2.2)
Albumin: 4.3 g/dL (ref 3.8–4.9)
Alkaline Phosphatase: 77 IU/L (ref 44–121)
BUN/Creatinine Ratio: 15 (ref 12–28)
BUN: 12 mg/dL (ref 8–27)
Bilirubin Total: 0.9 mg/dL (ref 0.0–1.2)
CO2: 23 mmol/L (ref 20–29)
Calcium: 9.7 mg/dL (ref 8.7–10.3)
Chloride: 100 mmol/L (ref 96–106)
Creatinine, Ser: 0.8 mg/dL (ref 0.57–1.00)
Globulin, Total: 2.9 g/dL (ref 1.5–4.5)
Glucose: 119 mg/dL — ABNORMAL HIGH (ref 65–99)
Potassium: 4.1 mmol/L (ref 3.5–5.2)
Sodium: 140 mmol/L (ref 134–144)
Total Protein: 7.2 g/dL (ref 6.0–8.5)
eGFR: 84 mL/min/{1.73_m2} (ref >59)

## 2020-07-21 LAB — TSH: TSH: 2.27 u[IU]/mL (ref 0.450–4.500)

## 2020-07-21 LAB — T4: T4, Total: 7.1 ug/dL (ref 4.5–12.0)

## 2020-07-21 LAB — FOLATE: Folate: 6 ng/mL (ref >3.0)

## 2020-07-21 LAB — HEMOGLOBIN A1C
Est. average glucose Bld gHb Est-mCnc: 126 mg/dL
Hgb A1c MFr Bld: 6 % — ABNORMAL HIGH (ref 4.8–5.6)

## 2020-07-21 LAB — INSULIN, RANDOM: INSULIN: 17.8 u[IU]/mL (ref 2.6–24.9)

## 2020-07-21 LAB — VITAMIN B12: Vitamin B-12: 554 pg/mL (ref 232–1245)

## 2020-07-21 LAB — VITAMIN D 25 HYDROXY (VIT D DEFICIENCY, FRACTURES): Vit D, 25-Hydroxy: 33.6 ng/mL (ref 30.0–100.0)

## 2020-07-21 LAB — T3: T3, Total: 126 ng/dL (ref 71–180)

## 2020-07-21 NOTE — Progress Notes (Signed)
Chief Complaint:   OBESITY [name] (MR# [id number]) is a 60 y.o. female who presents for evaluation and treatment of obesity and related comorbidities. Current BMI is Body mass index is 37.94 kg/m. [name] has been struggling with her weight for many years and has been unsuccessful in either losing weight, maintaining weight loss, or reaching her healthy weight goal.  [name] is currently in the action stage of change and ready to dedicate time achieving and maintaining a healthier weight. [name] is interested in becoming our patient and working on intensive lifestyle modifications including (but not limited to) diet and exercise for weight loss.  [name]'s habits were reviewed today and are as follows: Her family eats meals together, she thinks her family will eat healthier with her, her desired weight loss is 63 lbs, she started gaining weight after the birth of her 3rd child, her heaviest weight ever was 235 pounds, she has significant food cravings issues, she snacks frequently in the evenings, she skips meals frequently, she is frequently drinking liquids with calories, she frequently makes poor food choices, she frequently eats larger portions than normal and she struggles with emotional eating.  Depression Screen [name]'s Food and Mood (modified PHQ-9) score was 17.  Depression screen PHQ 2/9 07/20/2020  Decreased Interest 1  Down, Depressed, Hopeless 1  PHQ - 2 Score 2  Altered sleeping 0  Tired, decreased energy 0  Change in appetite 2  Feeling bad or failure about yourself  0  Trouble concentrating 1  Moving slowly or fidgety/restless 0  Suicidal thoughts 0  PHQ-9 Score 5   Subjective:   1. Other fatigue [name] admits to daytime somnolence and denies waking up still tired. Patent has a history of symptoms of daytime fatigue and morning headache. [name] generally gets 7 or 8 hours of sleep per night, and states that she has generally restful sleep. Snoring is  present. Apneic episodes are present. Epworth Sleepiness Score is 5.  2. SOB (shortness of breath) on exertion [name] notes increasing shortness of breath with exercising and seems to be worsening over time with weight gain. She notes getting out of breath sooner with activity than she used to. This has not gotten worse recently. [name] denies shortness of breath at rest or orthopnea.  3. Primary hypertension [name]'s blood pressure is elevated today. She states it is normally well controlled.  4. Mixed hyperlipidemia [name] is stable on statin, and she is working on diet and exercise. She denies chest pain.  5. Hyperglycemia [name] has a history of multiple elevated glucose readings. She has no recent labs in Epic.  6. PVC (premature ventricular contraction) [name] is on flecainide and metoprolol. 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 She is followed closely by Cardiology.  7. Vitamin D deficiency [name] is on OTC Vit D, and she is due for labs. She notes fatigue.  8. At risk for heart disease [name] is at a higher than average risk for cardiovascular disease due to obesity.   Assessment/Plan:   1. Other fatigue [name] does feel that her weight is causing her energy to be lower than it should be. Fatigue may be related to obesity, depression or many other causes. Labs will be ordered, and in the meanwhile, [name] will focus on self care including making healthy food choices, increasing physical activity and focusing on stress reduction.  - Vitamin B12 - TSH - Folate -  T3 - T4 - CBC With Differential - EKG 12-Lead  2. SOB (shortness of breath) on exertion [name] does feel that she gets out of breath more easily that she used to when she exercises. [name]'s shortness of breath appears to be obesity related and exercise induced. She has agreed to work on weight loss and gradually  increase exercise to treat her exercise induced shortness of breath. Will continue to monitor closely.  3. Primary hypertension [name] is working on healthy weight loss and exercise to improve blood pressure control. We will watch for signs of hypotension as she continues her lifestyle modifications.  - Comprehensive metabolic panel  4. Mixed hyperlipidemia Cardiovascular risk and specific lipid/LDL goals reviewed. We discussed several lifestyle modifications today. We will check labs today. [name] will start her Category 2 plan, and will work on exercise and weight loss efforts. Orders and follow up as documented in patient record.   - Lipid Panel With LDL/HDL Ratio  5. Hyperglycemia Fasting labs will be obtained today, and results with be discussed with [name] in 2 weeks at her follow up visit. In the meanwhile [name] will start her Category 2 plan and will work on weight loss efforts.  - Insulin, random - Hemoglobin A1c  6. PVC (premature ventricular contraction) EKG was done today. [name] will work on diet and exercise, and be mindful about medication options.  7. Vitamin D deficiency Low Vitamin D level contributes to fatigue and are associated with obesity, breast, and colon cancer. We will check labs today [name] will follow-up for routine testing of Vitamin D, at least 2-3 times per year to avoid over-replacement.  - VITAMIN D 25 Hydroxy (Vit-D Deficiency, Fractures)  8. Screening for depression [name] had a positive depression screening. Depression is commonly associated with obesity and often results in emotional eating behaviors. We will monitor this closely and work on CBT to help improve the non-hunger eating patterns. Referral to Psychology may be required if no improvement is seen as she continues in our clinic.  9. At risk for heart disease [name] was given approximately 30 minutes of coronary artery disease prevention counseling today. She is 60 y.o. female and has risk  factors for heart disease including obesity. We discussed intensive lifestyle modifications today with an emphasis on specific weight loss instructions and strategies.   Repetitive spaced learning was employed today to elicit superior memory formation and behavioral change.  10. Obesity with current BMI 38.0 [name] is currently in the action stage of change and her goal is to continue with weight loss efforts. I recommend [name] begin the structured treatment plan as follows:  She has agreed to the Category 2 Plan + 100 calories.  Exercise goals: No exercise has been prescribed for now, while we concentrate on nutritional changes.  Behavioral modification strategies: increasing lean protein intake and meal planning and cooking strategies.  She was informed of the importance of frequent follow-up visits to maximize her success with intensive lifestyle modifications for her multiple health conditions. She was informed we would discuss her lab results at her next visit unless there is a critical issue that needs to be addressed sooner. [name] agreed to keep her next visit at the agreed upon time to discuss these results.  Objective:   Blood pressure (!) 148/77, pulse 71, temperature 98.2 F (36.8 C), height 5\' 5"  (1.651 m), weight 228 lb (103.4 kg), SpO2 97 %. Body mass index is 37.94 kg/m.  EKG: Normal sinus rhythm, rate 68 BPM.  Indirect  Calorimeter completed today shows a VO2 of 282 and a REE of 1963.  Her calculated basal metabolic rate is 4401 thus her basal metabolic rate is better than expected.  General: Cooperative, alert, well developed, in no acute distress. HEENT: Conjunctivae and lids unremarkable. Cardiovascular: Regular rhythm.  Lungs: Normal work of breathing. Neurologic: No focal deficits.   Lab Results  Component Value Date   CREATININE 0.80 07/20/2020   BUN 12 07/20/2020   NA 140 07/20/2020   K 4.1 07/20/2020   CL 100 07/20/2020   CO2 23 07/20/2020   Lab  Results  Component Value Date   ALT 27 07/20/2020   AST 15 07/20/2020   ALKPHOS 77 07/20/2020   BILITOT 0.9 07/20/2020   Lab Results  Component Value Date   HGBA1C 6.0 (H) 07/20/2020   Lab Results  Component Value Date   INSULIN 17.8 07/20/2020   Lab Results  Component Value Date   TSH 2.270 07/20/2020   Lab Results  Component Value Date   CHOL 194 07/20/2020   HDL 55 07/20/2020   LDLCALC 123 (H) 07/20/2020   TRIG 87 07/20/2020   CHOLHDL 3.3 12/23/2018   Lab Results  Component Value Date   WBC 7.8 07/20/2020   HGB 13.3 07/20/2020   HCT 39.7 07/20/2020   MCV 91 07/20/2020   PLT 497 (H) 04/18/2019   Lab Results  Component Value Date   FERRITIN 1,561 (H) 04/13/2019   Attestation Statements:   Reviewed by clinician on day of visit: allergies, medications, problem list, medical history, surgical history, family history, social history, and previous encounter notes.   I, Burt Knack, am acting as transcriptionist for Quillian Quince, MD.  I have reviewed the above documentation for accuracy and completeness, and I agree with the above. - Quillian Quince, MD

## 2020-08-03 ENCOUNTER — Ambulatory Visit (INDEPENDENT_AMBULATORY_CARE_PROVIDER_SITE_OTHER): Payer: 59 | Admitting: Family Medicine

## 2020-08-03 ENCOUNTER — Other Ambulatory Visit: Payer: Self-pay

## 2020-08-03 ENCOUNTER — Encounter (INDEPENDENT_AMBULATORY_CARE_PROVIDER_SITE_OTHER): Payer: Self-pay | Admitting: Family Medicine

## 2020-08-03 VITALS — BP 138/72 | HR 58 | Temp 98.3°F | Ht 65.0 in | Wt 223.0 lb

## 2020-08-03 DIAGNOSIS — E559 Vitamin D deficiency, unspecified: Secondary | ICD-10-CM | POA: Diagnosis not present

## 2020-08-03 DIAGNOSIS — E7849 Other hyperlipidemia: Secondary | ICD-10-CM

## 2020-08-03 DIAGNOSIS — Z9189 Other specified personal risk factors, not elsewhere classified: Secondary | ICD-10-CM

## 2020-08-03 DIAGNOSIS — R7303 Prediabetes: Secondary | ICD-10-CM | POA: Diagnosis not present

## 2020-08-03 DIAGNOSIS — Z6837 Body mass index (BMI) 37.0-37.9, adult: Secondary | ICD-10-CM

## 2020-08-03 DIAGNOSIS — E7800 Pure hypercholesterolemia, unspecified: Secondary | ICD-10-CM

## 2020-08-03 MED ORDER — VITAMIN D (ERGOCALCIFEROL) 1.25 MG (50000 UNIT) PO CAPS
50000.0000 [IU] | ORAL_CAPSULE | ORAL | 0 refills | Status: DC
Start: 2020-08-03 — End: 2020-09-05

## 2020-08-04 NOTE — Progress Notes (Signed)
Chief Complaint:   OBESITY [name] is here to discuss her progress with her obesity treatment plan along with follow-up of her obesity related diagnoses. [name] is on the Category 2 Plan and states she is following her eating plan approximately 100% of the time. [name] states she is walking for 30 minutes 3 times per week.  Today's visit was #: 2 Starting weight: 228 lbs Starting date: 07/20/2020 Today's weight: 223 lbs Today's date: 08/03/2020 Total lbs lost to date: 5 Total lbs lost since last in-office visit: 5  Interim History: [name] has done very well with weight loss on her plan. Her hunger was mostly controlled, and she struggled to eat all of her food at dinner.  Subjective:   1. Pre-diabetes [name]'s fasting glucose, A1c, and fasting insulin are all elevated, suggesting she is very close to developing diabetes mellitus. She notes her polyphagia has decreased on her lower carbohydrate plan. I discussed labs with the patient today.  2. Vitamin D deficiency [name] has a new diagnosis of Vit D deficiency. Her Vit D level is below goal, and she notes fatigue. I discussed labs with the patient today.  3. Hyperlipidemia, pure [name]'s LDL is elevated, and she is not on statin. She wants to work on diet and weight loss. I discussed labs with the patient today.  4. At risk for diabetes mellitus [name] is at higher than average risk for developing diabetes due to obesity.   Assessment/Plan:   1. Pre-diabetes [name] will continue with diet, exercise, and decreasing simple carbohydrates to help decrease the risk of diabetes. We will recheck labs in 3 months.  2. Vitamin D deficiency Low Vitamin D level contributes to fatigue and are associated with obesity, breast, and colon cancer. [name] agreed to start prescription Vitamin D 50,000 IU every week with no refills. She will follow-up for routine testing of Vitamin D, at least 2-3 times per year to avoid over-replacement.  -  Vitamin D, Ergocalciferol, (DRISDOL) 1.25 MG (50000 UNIT) CAPS capsule; Take 1 capsule (50,000 Units total) by mouth every 7 (seven) days.  Dispense: 4 capsule; Refill: 0  3. Hyperlipidemia, pure Cardiovascular risk and specific lipid/LDL goals reviewed. We discussed several lifestyle modifications today. [name] will continue to work on diet, exercise and weight loss efforts. We will recheck labs in 3 months. Orders and follow up as documented in patient record.   4. At risk for diabetes mellitus [name] was given approximately 30 minutes of diabetes education and counseling today. We discussed intensive lifestyle modifications today with an emphasis on weight loss as well as increasing exercise and decreasing simple carbohydrates in her diet. We also reviewed medication options with an emphasis on risk versus benefit of those discussed.   Repetitive spaced learning was employed today to elicit superior memory formation and behavioral change.  5. Obesity with current BMI 37.2 [name] is currently in the action stage of change. As such, her goal is to continue with weight loss efforts. She has agreed to the Category 2 Plan.   Lean meat equivalents were discussed.  Exercise goals: As is.  Behavioral modification strategies: better snacking choices.  [name] has agreed to follow-up with our clinic in 2 weeks. She was informed of the importance of frequent follow-up visits to maximize her success with intensive lifestyle modifications for her multiple health conditions.   Objective:   Blood pressure 138/72, pulse (!) 58, temperature 98.3 F (36.8 C), height 5\' 5"  (1.651 m), weight 223 lb (101.2 kg), SpO2  96 %. Body mass index is 37.11 kg/m.  General: Cooperative, alert, well developed, in no acute distress. HEENT: Conjunctivae and lids unremarkable. Cardiovascular: Regular rhythm.  Lungs: Normal work of breathing. Neurologic: No focal deficits.   Lab Results  Component Value Date    CREATININE 0.80 07/20/2020   BUN 12 07/20/2020   NA 140 07/20/2020   K 4.1 07/20/2020   CL 100 07/20/2020   CO2 23 07/20/2020   Lab Results  Component Value Date   ALT 27 07/20/2020   AST 15 07/20/2020   ALKPHOS 77 07/20/2020   BILITOT 0.9 07/20/2020   Lab Results  Component Value Date   HGBA1C 6.0 (H) 07/20/2020   Lab Results  Component Value Date   INSULIN 17.8 07/20/2020   Lab Results  Component Value Date   TSH 2.270 07/20/2020   Lab Results  Component Value Date   CHOL 194 07/20/2020   HDL 55 07/20/2020   LDLCALC 123 (H) 07/20/2020   TRIG 87 07/20/2020   CHOLHDL 3.3 12/23/2018   Lab Results  Component Value Date   WBC 7.8 07/20/2020   HGB 13.3 07/20/2020   HCT 39.7 07/20/2020   MCV 91 07/20/2020   PLT 497 (H) 04/18/2019   Lab Results  Component Value Date   FERRITIN 1,561 (H) 04/13/2019   Attestation Statements:   Reviewed by clinician on day of visit: allergies, medications, problem list, medical history, surgical history, family history, social history, and previous encounter notes.   I, Burt Knack, am acting as transcriptionist for Quillian Quince, MD.  I have reviewed the above documentation for accuracy and completeness, and I agree with the above. -  Quillian Quince, MD

## 2020-08-17 ENCOUNTER — Other Ambulatory Visit: Payer: Self-pay

## 2020-08-17 ENCOUNTER — Ambulatory Visit (INDEPENDENT_AMBULATORY_CARE_PROVIDER_SITE_OTHER): Payer: 59 | Admitting: Family Medicine

## 2020-08-17 ENCOUNTER — Encounter (INDEPENDENT_AMBULATORY_CARE_PROVIDER_SITE_OTHER): Payer: Self-pay | Admitting: Family Medicine

## 2020-08-17 VITALS — BP 157/78 | HR 54 | Temp 98.1°F | Ht 65.0 in | Wt 219.0 lb

## 2020-08-17 DIAGNOSIS — I10 Essential (primary) hypertension: Secondary | ICD-10-CM | POA: Diagnosis not present

## 2020-08-17 DIAGNOSIS — Z6837 Body mass index (BMI) 37.0-37.9, adult: Secondary | ICD-10-CM

## 2020-08-23 NOTE — Progress Notes (Signed)
Chief Complaint:   OBESITY [name] is here to discuss her progress with her obesity treatment plan along with follow-up of her obesity related diagnoses. [name] is on the Category 2 Plan and states she is following her eating plan approximately 95-100% of the time. [name] states she is walking for 30 minutes 3 times per week.  Today's visit was #: 3 Starting weight: 228 lbs Starting date: 07/20/2020 Today's weight: 219 lbs Today's date: 08/17/2020 Total lbs lost to date: 9 Total lbs lost since last in-office visit: 4  Interim History: [name] continues to do well with weight loss on her Category 2 plan. She is starting to get bored with dinner and she is open  To looking at other options.  Subjective:   1. Essential hypertension [name]'s blood pressure is elevated today. She is working on diet and weight loss, and she feels her blood pressure is elevated due to rushing to get here. She denies chest pain. Her blood pressure runs between 110 and 120 systolic at home.  Assessment/Plan:   1. Essential hypertension [name] will continue her medications and healthy weight loss to improve blood pressure control. Will continue to monitor as she continues her lifestyle modifications.  2. Obesity with current BMI 36.5 [name] is currently in the action stage of change. As such, her goal is to continue with weight loss efforts. She has agreed to the Category 2 Plan and keeping a food journal and adhering to recommended goals of 400-550 calories and 40+ grams of protein daily.   Exercise goals: As is.  Behavioral modification strategies: increasing lean protein intake, increasing water intake, no skipping meals and keeping a strict food journal.  Julius has agreed to follow-up with our clinic in 3 weeks. She was informed of the importance of frequent follow-up visits to maximize her success with intensive lifestyle modifications for her multiple health conditions.   Objective:   Blood  pressure (!) 157/78, pulse (!) 54, temperature 98.1 F (36.7 C), height 5\' 5"  (1.651 m), weight 219 lb (99.3 kg), SpO2 97 %. Body mass index is 36.44 kg/m.  General: Cooperative, alert, well developed, in no acute distress. HEENT: Conjunctivae and lids unremarkable. Cardiovascular: Regular rhythm.  Lungs: Normal work of breathing. Neurologic: No focal deficits.   Lab Results  Component Value Date   CREATININE 0.80 07/20/2020   BUN 12 07/20/2020   NA 140 07/20/2020   K 4.1 07/20/2020   CL 100 07/20/2020   CO2 23 07/20/2020   Lab Results  Component Value Date   ALT 27 07/20/2020   AST 15 07/20/2020   ALKPHOS 77 07/20/2020   BILITOT 0.9 07/20/2020   Lab Results  Component Value Date   HGBA1C 6.0 (H) 07/20/2020   Lab Results  Component Value Date   INSULIN 17.8 07/20/2020   Lab Results  Component Value Date   TSH 2.270 07/20/2020   Lab Results  Component Value Date   CHOL 194 07/20/2020   HDL 55 07/20/2020   LDLCALC 123 (H) 07/20/2020   TRIG 87 07/20/2020   CHOLHDL 3.3 12/23/2018   Lab Results  Component Value Date   WBC 7.8 07/20/2020   HGB 13.3 07/20/2020   HCT 39.7 07/20/2020   MCV 91 07/20/2020   PLT 497 (H) 04/18/2019   Lab Results  Component Value Date   FERRITIN 1,561 (H) 04/13/2019   Attestation Statements:   Reviewed by clinician on day of visit: allergies, medications, problem list, medical history, surgical history, family history,  social history, and previous encounter notes.  Time spent on visit including pre-visit chart review and post-visit care and charting was 30 minutes.    I, Burt Knack, am acting as transcriptionist for Quillian Quince, MD.  I have reviewed the above documentation for accuracy and completeness, and I agree with the above. -  Quillian Quince, MD

## 2020-09-05 ENCOUNTER — Encounter (INDEPENDENT_AMBULATORY_CARE_PROVIDER_SITE_OTHER): Payer: Self-pay | Admitting: Family Medicine

## 2020-09-05 ENCOUNTER — Other Ambulatory Visit: Payer: Self-pay

## 2020-09-05 ENCOUNTER — Ambulatory Visit (INDEPENDENT_AMBULATORY_CARE_PROVIDER_SITE_OTHER): Payer: 59 | Admitting: Family Medicine

## 2020-09-05 VITALS — BP 127/77 | HR 57 | Temp 97.7°F | Ht 65.0 in | Wt 216.0 lb

## 2020-09-05 DIAGNOSIS — Z6838 Body mass index (BMI) 38.0-38.9, adult: Secondary | ICD-10-CM

## 2020-09-05 DIAGNOSIS — Z9189 Other specified personal risk factors, not elsewhere classified: Secondary | ICD-10-CM

## 2020-09-05 DIAGNOSIS — I10 Essential (primary) hypertension: Secondary | ICD-10-CM

## 2020-09-05 DIAGNOSIS — E559 Vitamin D deficiency, unspecified: Secondary | ICD-10-CM | POA: Diagnosis not present

## 2020-09-06 NOTE — Progress Notes (Signed)
Chief Complaint:   OBESITY [name] is here to discuss her progress with her obesity treatment plan along with follow-up of her obesity related diagnoses. [name] is on the Category 2 Plan and keeping a food journal and adhering to recommended goals of 400-550 calories and 40+ grams of protein at supper daily and states she is following her eating plan approximately 95% of the time. [name] states she is walking for 30 minutes 3 times per week.  Today's visit was #: 4 Starting weight: 228 lbs Starting date: 07/20/2020 Today's weight: 216 lbs Today's date: 09/05/2020 Total lbs lost to date: 12 Total lbs lost since last in-office visit: 3  Interim History: [name] continues to do very well with weight loss. She is eating all of the food on her plan and her hunger is reasonably well controlled. She will be on vacation soon and she has questions about how to eat while traveling.  Subjective:   1. Vitamin D deficiency [name]'s Vit D level is not yet at goal. She denies nausea, vomiting, or muscle weakness.  2. Essential hypertension [name]'s blood pressure is well controlled with diet and weight loss. She denies signs of hypotension.  3. At risk for heart disease [name] is at a higher than average risk for cardiovascular disease due to obesity.   Assessment/Plan:   1. Vitamin D deficiency Low Vitamin D level contributes to fatigue and are associated with obesity, breast, and colon cancer. We will refill prescription Vitamin D for 1 month, and we will recheck labs in 2 months. [name] will follow-up for routine testing of Vitamin D, at least 2-3 times per year to avoid over-replacement.  2. Essential hypertension [name] will continue her medications, diet, and weight loss to improve blood pressure control. She will continue to follow up as directed.  3. At risk for heart disease [name] was given approximately 15 minutes of coronary artery disease prevention counseling today. She is 60  y.o. female and has risk factors for heart disease including obesity. We discussed intensive lifestyle modifications today with an emphasis on specific weight loss instructions and strategies.   Repetitive spaced learning was employed today to elicit superior memory formation and behavioral change.  4. Obesity with current BMI 36 [name] is currently in the action stage of change. As such, her goal is to continue with weight loss efforts. She has agreed to the Category 2 Plan with breakfast options.   Exercise goals: As is.  Behavioral modification strategies: increasing lean protein intake, increasing water intake, and travel eating strategies.  Cloee has agreed to follow-up with our clinic in 2 to 3 weeks. She was informed of the importance of frequent follow-up visits to maximize her success with intensive lifestyle modifications for her multiple health conditions.   Objective:   Blood pressure 127/77, pulse (!) 57, temperature 97.7 F (36.5 C), height 5\' 5"  (1.651 m), weight 216 lb (98 kg), SpO2 96 %. Body mass index is 35.94 kg/m.  General: Cooperative, alert, well developed, in no acute distress. HEENT: Conjunctivae and lids unremarkable. Cardiovascular: Regular rhythm.  Lungs: Normal work of breathing. Neurologic: No focal deficits.   Lab Results  Component Value Date   CREATININE 0.80 07/20/2020   BUN 12 07/20/2020   NA 140 07/20/2020   K 4.1 07/20/2020   CL 100 07/20/2020   CO2 23 07/20/2020   Lab Results  Component Value Date   ALT 27 07/20/2020   AST 15 07/20/2020   ALKPHOS 77 07/20/2020  BILITOT 0.9 07/20/2020   Lab Results  Component Value Date   HGBA1C 6.0 (H) 07/20/2020   Lab Results  Component Value Date   INSULIN 17.8 07/20/2020   Lab Results  Component Value Date   TSH 2.270 07/20/2020   Lab Results  Component Value Date   CHOL 194 07/20/2020   HDL 55 07/20/2020   LDLCALC 123 (H) 07/20/2020   TRIG 87 07/20/2020   CHOLHDL 3.3 12/23/2018    Lab Results  Component Value Date   WBC 7.8 07/20/2020   HGB 13.3 07/20/2020   HCT 39.7 07/20/2020   MCV 91 07/20/2020   PLT 497 (H) 04/18/2019   Lab Results  Component Value Date   FERRITIN 1,561 (H) 04/13/2019   Attestation Statements:   Reviewed by clinician on day of visit: allergies, medications, problem list, medical history, surgical history, family history, social history, and previous encounter notes.   I, Burt Knack, am acting as transcriptionist for Quillian Quince, MD.  I have reviewed the above documentation for accuracy and completeness, and I agree with the above. -  Quillian Quince, MD

## 2020-09-08 ENCOUNTER — Other Ambulatory Visit (INDEPENDENT_AMBULATORY_CARE_PROVIDER_SITE_OTHER): Payer: Self-pay | Admitting: Family Medicine

## 2020-09-08 DIAGNOSIS — E559 Vitamin D deficiency, unspecified: Secondary | ICD-10-CM

## 2020-09-08 NOTE — Telephone Encounter (Signed)
Last OV with Dr. Beasley 

## 2020-09-13 MED ORDER — VITAMIN D (ERGOCALCIFEROL) 1.25 MG (50000 UNIT) PO CAPS: 50000.0000 [IU] | ORAL_CAPSULE | ORAL | 0 refills | Status: DC

## 2020-09-26 ENCOUNTER — Other Ambulatory Visit: Payer: Self-pay

## 2020-09-26 ENCOUNTER — Encounter (INDEPENDENT_AMBULATORY_CARE_PROVIDER_SITE_OTHER): Payer: Self-pay | Admitting: Family Medicine

## 2020-09-26 ENCOUNTER — Ambulatory Visit (INDEPENDENT_AMBULATORY_CARE_PROVIDER_SITE_OTHER): Payer: 59 | Admitting: Family Medicine

## 2020-09-26 VITALS — BP 140/83 | HR 52 | Temp 98.0°F | Ht 65.0 in | Wt 212.0 lb

## 2020-09-26 DIAGNOSIS — E559 Vitamin D deficiency, unspecified: Secondary | ICD-10-CM

## 2020-09-26 DIAGNOSIS — Z6837 Body mass index (BMI) 37.0-37.9, adult: Secondary | ICD-10-CM

## 2020-09-26 DIAGNOSIS — E860 Dehydration: Secondary | ICD-10-CM

## 2020-09-26 DIAGNOSIS — I10 Essential (primary) hypertension: Secondary | ICD-10-CM | POA: Diagnosis not present

## 2020-10-03 NOTE — Progress Notes (Signed)
Chief Complaint:   OBESITY [name] is here to discuss her progress with her obesity treatment plan along with follow-up of her obesity related diagnoses. [name] is on the Category 2 Plan with breakfast options and states she is following her eating plan approximately 95% of the time. [name] states she is walking for 30 minutes 3-5 times per week.  Today's visit was #: 5 Starting weight: 228 lbs Starting date: 07/20/2020 Today's weight: 212 lbs Today's date: 09/26/2020 Total lbs lost to date: 16 Total lbs lost since last in-office visit: 4  Interim History: [name] continues to do well with weight loss on her Category 2 plan. Her hunger is mostly controlled but she is getting bored with some of her options.  Subjective:   1. Essential hypertension [name]'s blood pressure is mildly elevated today. She continues to do well with diet, exercise, and weight loss. Her blood pressure is usually at goal.  2. Dehydration [name] notes increased muscle cramps in her legs in there evenings, which is worse recently with increased heat. Her urine is yellow.  Assessment/Plan:   1. Essential hypertension [name] will continue with diet and exercise to improve blood pressure control. We will recheck her blood pressure in 2-3 weeks.  2. Dehydration [name] is to increase hydration, and she is ok to add zero calorie electrolyte drinks. Her goal is to make her urine very light colored.  3. Obesity with current BMI 35.4 [name] is currently in the action stage of change. As such, her goal is to continue with weight loss efforts. She has agreed to the Category 2 Plan and keeping a food journal and adhering to recommended goals of 1200-1400 calories and 80+ grams of protein daily.   Exercise goals: As is.  Behavioral modification strategies: increasing lean protein intake.  [name] has agreed to follow-up with our clinic in 2 to 3 weeks. She was informed of the importance of frequent follow-up visits to  maximize her success with intensive lifestyle modifications for her multiple health conditions.   Objective:   Blood pressure 140/83, pulse (!) 52, temperature 98 F (36.7 C), height 5\' 5"  (1.651 m), weight 212 lb (96.2 kg), SpO2 97 %. Body mass index is 35.28 kg/m.  General: Cooperative, alert, well developed, in no acute distress. HEENT: Conjunctivae and lids unremarkable. Cardiovascular: Regular rhythm.  Lungs: Normal work of breathing. Neurologic: No focal deficits.   Lab Results  Component Value Date   CREATININE 0.80 07/20/2020   BUN 12 07/20/2020   NA 140 07/20/2020   K 4.1 07/20/2020   CL 100 07/20/2020   CO2 23 07/20/2020   Lab Results  Component Value Date   ALT 27 07/20/2020   AST 15 07/20/2020   ALKPHOS 77 07/20/2020   BILITOT 0.9 07/20/2020   Lab Results  Component Value Date   HGBA1C 6.0 (H) 07/20/2020   Lab Results  Component Value Date   INSULIN 17.8 07/20/2020   Lab Results  Component Value Date   TSH 2.270 07/20/2020   Lab Results  Component Value Date   CHOL 194 07/20/2020   HDL 55 07/20/2020   LDLCALC 123 (H) 07/20/2020   TRIG 87 07/20/2020   CHOLHDL 3.3 12/23/2018   Lab Results  Component Value Date   VD25OH 33.6 07/20/2020   Lab Results  Component Value Date   WBC 7.8 07/20/2020   HGB 13.3 07/20/2020   HCT 39.7 07/20/2020   MCV 91 07/20/2020   PLT 497 (H) 04/18/2019   Lab  Results  Component Value Date   FERRITIN 1,561 (H) 04/13/2019   Attestation Statements:   Reviewed by clinician on day of visit: allergies, medications, problem list, medical history, surgical history, family history, social history, and previous encounter notes.  Time spent on visit including pre-visit chart review and post-visit care and charting was 30 minutes.    I, Burt Knack, am acting as transcriptionist for Quillian Quince, MD.  I have reviewed the above documentation for accuracy and completeness, and I agree with the above. -  Quillian Quince,  MD

## 2020-10-17 ENCOUNTER — Other Ambulatory Visit: Payer: Self-pay

## 2020-10-17 ENCOUNTER — Encounter (INDEPENDENT_AMBULATORY_CARE_PROVIDER_SITE_OTHER): Payer: Self-pay | Admitting: Family Medicine

## 2020-10-17 ENCOUNTER — Ambulatory Visit (INDEPENDENT_AMBULATORY_CARE_PROVIDER_SITE_OTHER): Payer: 59 | Admitting: Family Medicine

## 2020-10-17 VITALS — BP 140/78 | HR 65 | Temp 98.0°F | Ht 65.0 in | Wt 206.0 lb

## 2020-10-17 DIAGNOSIS — I493 Ventricular premature depolarization: Secondary | ICD-10-CM

## 2020-10-17 DIAGNOSIS — E785 Hyperlipidemia, unspecified: Secondary | ICD-10-CM

## 2020-10-17 DIAGNOSIS — E559 Vitamin D deficiency, unspecified: Secondary | ICD-10-CM | POA: Diagnosis not present

## 2020-10-17 DIAGNOSIS — Z9189 Other specified personal risk factors, not elsewhere classified: Secondary | ICD-10-CM | POA: Diagnosis not present

## 2020-10-17 DIAGNOSIS — Z6837 Body mass index (BMI) 37.0-37.9, adult: Secondary | ICD-10-CM | POA: Diagnosis not present

## 2020-10-17 DIAGNOSIS — I10 Essential (primary) hypertension: Secondary | ICD-10-CM

## 2020-10-18 NOTE — Telephone Encounter (Signed)
[facility]) called today to give this order back to me from January. It is an over night pulse oximetry test and they are not abel to run this test because they only had one and it is broken and the company is not going to replace it. Order sent to [facility] via community message.

## 2020-10-19 MED ORDER — HYDROCHLOROTHIAZIDE 25 MG PO TABS: 25.0000 mg | ORAL_TABLET | Freq: Every day | ORAL | 0 refills | Status: DC

## 2020-10-19 MED ORDER — VITAMIN D (ERGOCALCIFEROL) 1.25 MG (50000 UNIT) PO CAPS: 50000.0000 [IU] | ORAL_CAPSULE | ORAL | 0 refills | Status: DC

## 2020-10-19 NOTE — Progress Notes (Signed)
Chief Complaint:   OBESITY [name] is here to discuss her progress with her obesity treatment plan along with follow-up of her obesity related diagnoses. [name] is on the Category 2 Plan or keeping a food journal and adhering to recommended goals of 1200-1400 calories and 80+ grams of protein daily and states she is following her eating plan approximately 95% of the time. [name] states she is walking for 30 minutes 3-5 times per week.  Today's visit was #: 6 Starting weight: 228 lbs Starting date: 07/20/2020 Today's weight: 206 lbs Today's date: 10/17/2020 Total lbs lost to date: 22 Total lbs lost since last in-office visit: 6  Interim History: [name] continues to do very well with weight loss. She struggled with journaling on the app, so she is mostly doing the Category 2 plan. She is interested in more lunch ideas.  Subjective:   1. Vitamin D deficiency [name] is stable on Vit D, but her level is not yet at goal.  2. Essential hypertension [name]'s blood pressure is slightly elevated today. She is on metoprolol and hydrochlorothiazide, and she is doing very well with weight loss.  3. At risk for heart disease [name] is at a higher than average risk for cardiovascular disease due to obesity.   Assessment/Plan:   1. Vitamin D deficiency Low Vitamin D level contributes to fatigue and are associated with obesity, breast, and colon cancer. We will refill prescription Vitamin D 50,000 IU every week #4 for 1 month, and we will recheck labs in 2-3 months. [name] will follow-up for routine testing of Vitamin D, at least 2-3 times per year to avoid over-replacement.  2. Essential hypertension [name] is working on healthy weight loss and exercise to improve blood pressure control. We will refill hydrochlorothiazide 25 mg q daily #30  For 1 month, and will watch for signs of hypotension as she continues her lifestyle modifications.  3. At risk for heart disease [name] was given  approximately 15 minutes of coronary artery disease prevention counseling today. She is 60 y.o. female and has risk factors for heart disease including obesity. We discussed intensive lifestyle modifications today with an emphasis on specific weight loss instructions and strategies.   Repetitive spaced learning was employed today to elicit superior memory formation and behavioral change.  4. Obesity with current BMI 34.4 [name] is currently in the action stage of change. As such, her goal is to continue with weight loss efforts. She has agreed to the Category 2 Plan with lunch options or keeping a food journal and adhering to recommended goals of 1200-1400 calories and 80+ grams of protein daily.   Exercise goals: As is, add strengthening exercise.  Behavioral modification strategies: no skipping meals and meal planning and cooking strategies.  Louis has agreed to follow-up with our clinic in 3 weeks. She was informed of the importance of frequent follow-up visits to maximize her success with intensive lifestyle modifications for her multiple health conditions.   Objective:   Blood pressure 140/78, pulse 65, temperature 98 F (36.7 C), height 5\' 5"  (1.651 m), weight 206 lb (93.4 kg), SpO2 97 %. Body mass index is 34.28 kg/m.  General: Cooperative, alert, well developed, in no acute distress. HEENT: Conjunctivae and lids unremarkable. Cardiovascular: Regular rhythm.  Lungs: Normal work of breathing. Neurologic: No focal deficits.   Lab Results  Component Value Date   CREATININE 0.80 07/20/2020   BUN 12 07/20/2020   NA 140 07/20/2020   K 4.1 07/20/2020   CL  100 07/20/2020   CO2 23 07/20/2020   Lab Results  Component Value Date   ALT 27 07/20/2020   AST 15 07/20/2020   ALKPHOS 77 07/20/2020   BILITOT 0.9 07/20/2020   Lab Results  Component Value Date   HGBA1C 6.0 (H) 07/20/2020   Lab Results  Component Value Date   INSULIN 17.8 07/20/2020   Lab Results  Component  Value Date   TSH 2.270 07/20/2020   Lab Results  Component Value Date   CHOL 194 07/20/2020   HDL 55 07/20/2020   LDLCALC 123 (H) 07/20/2020   TRIG 87 07/20/2020   CHOLHDL 3.3 12/23/2018   Lab Results  Component Value Date   VD25OH 33.6 07/20/2020   Lab Results  Component Value Date   WBC 7.8 07/20/2020   HGB 13.3 07/20/2020   HCT 39.7 07/20/2020   MCV 91 07/20/2020   PLT 497 (H) 04/18/2019   Lab Results  Component Value Date   FERRITIN 1,561 (H) 04/13/2019   Attestation Statements:   Reviewed by clinician on day of visit: allergies, medications, problem list, medical history, surgical history, family history, social history, and previous encounter notes.   I, Burt Knack, am acting as transcriptionist for Quillian Quince, MD.  I have reviewed the above documentation for accuracy and completeness, and I agree with the above. -  Quillian Quince, MD

## 2020-10-31 NOTE — Progress Notes (Signed)
PCP:  Shirlean Mylar, MD Primary Cardiologist: Armanda Magic, MD Electrophysiologist: Lewayne Bunting, MD   [name] is a 60 y.o. female seen today for Lewayne Bunting, MD for acute visit due to palpitations .    Since last being seen in our clinic the patient reports having more palpitations over the past 2-3 weeks. Her Vitamin D was increased, but otherwise no med changes. No recent COVID infection. She had COVID infection last year requiring admission. She did have her COVID booster about a month ago. Pfizer for all of her doses. She did not notice any increase palpitations from previous doses. She is having the palpitations 2-3 times a day, from 30-60 minutes in length. Exercises daily by walking and mixed cardio. She is working with [facility] [employer] No alcohol or caffeine intake or increase.  she denies chest pain, palpitations, dyspnea, PND, orthopnea, nausea, vomiting, dizziness, syncope, edema, weight gain, or early satiety.  Past Medical History:  Diagnosis Date   Back pain    Constipation    Heart burn    Hyperlipidemia    Hypertension    IBS (irritable bowel syndrome)    Joint pain    Lactose intolerance    Muscle pain    Other fatigue    Palpitations    Pre-diabetes    PVC (premature ventricular contraction)    Ringing in ear    Sleep apnea    SOB (shortness of breath) on exertion    Vitamin D deficiency    Past Surgical History:  Procedure Laterality Date   APPENDECTOMY     carpal tunn     CESAREAN SECTION     NEUROMA SURGERY     SEPTOPLASTY     TONSILLECTOMY     TOTAL ABDOMINAL HYSTERECTOMY W/ BILATERAL SALPINGOOPHORECTOMY     TUBAL LIGATION      Current Outpatient Medications  Medication Sig Dispense Refill   aspirin 81 MG chewable tablet Chew 1 tablet (81 mg total) by mouth daily.     atorvastatin (LIPITOR) 40 MG tablet Take 1 tablet (40 mg total) by mouth daily. 90 tablet 3   azelastine (ASTELIN) 0.1 % nasal spray Place into both  nostrils 2 (two) times daily. Use in each nostril as directed (Patient not taking: Reported on 11/02/2020)     carboxymethylcellulose (REFRESH PLUS) 0.5 % SOLN 1 drop. 2 drops each eye     cholecalciferol (VITAMIN D) 1000 UNITS tablet Take 1,000 Units by mouth daily. (Patient not taking: Reported on 11/02/2020)     estradiol (VIVELLE-DOT) 0.05 MG/24HR patch Place 1 patch onto the skin 2 (two) times a week.   0   famotidine (PEPCID) 20 MG tablet Take 1 tablet (20 mg total) by mouth daily. (Patient not taking: Reported on 11/02/2020) 90 tablet 3   flecainide (TAMBOCOR) 150 MG tablet Take 1/2 (one-half) tablet by mouth twice daily 90 tablet 2   hydrochlorothiazide (HYDRODIURIL) 25 MG tablet Take 1 tablet (25 mg total) by mouth daily. 30 tablet 0   Ipratropium-Albuterol (COMBIVENT RESPIMAT) 20-100 MCG/ACT AERS respimat Inhale 1 puff into the lungs every 6 (six) hours.     meloxicam (MOBIC) 15 MG tablet Take 15 mg by mouth daily. (Patient not taking: Reported on 11/02/2020)     metoprolol succinate (TOPROL-XL) 50 MG 24 hr tablet TAKE 1 TABLET BY MOUTH ONCE DAILY **TAKE  WITH  OR  IMMEDIATELY  FOLLOWING  A  MEAL 90 tablet 3   montelukast (SINGULAIR) 10 MG tablet Take  10 mg by mouth at bedtime.     potassium chloride 20 MEQ TBCR Take 20 mEq by mouth daily. 30 tablet 3   PROAIR HFA 108 (90 Base) MCG/ACT inhaler Inhale 2 puffs into the lungs as needed for shortness of breath.  5   Probiotic Product (PROBIOTIC-10 PO) Take by mouth as needed.     Vitamin D, Ergocalciferol, (DRISDOL) 1.25 MG (50000 UNIT) CAPS capsule Take 1 capsule (50,000 Units total) by mouth every 7 (seven) days. 4 capsule 0   No current facility-administered medications for this visit.    Allergies  Allergen Reactions   Buprenorphine Hcl Itching   Morphine And Related Itching   Morphine Sulfate Other (See Comments)   Other    Short Ragweed Pollen Ext Hives    Social History   Socioeconomic History   Marital status: Married     Spouse name: Not on file   Number of children: Not on file   Years of education: Not on file   Highest education level: Not on file  Occupational History   Occupation: Stay at home spouse  Tobacco Use   Smoking status: Never   Smokeless tobacco: Never  Vaping Use   Vaping Use: Never used  Substance and Sexual Activity   Alcohol use: Yes    Alcohol/week: 0.0 standard drinks    Comment: occsaionl wine   Drug use: Never   Sexual activity: Not on file  Other Topics Concern   Not on file  Social History Narrative   Tobacco use   Cigarettes: Never smoked   Tobacco history last updated 11/03/2013   No smoking   No alcohol    Yes caffeine   Recreational drug use  : no   Diet : regular    Exercise, Minimal   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   Social Determinants of Health   Financial Resource Strain: Not on file  Food Insecurity: Not on file  Transportation Needs: Not on file  Physical Activity: Not on file  Stress: Not on file  Social Connections: Not on file  Intimate Partner Violence: Not on file    Review of Systems: All other systems reviewed and are otherwise negative except as noted above.  Physical Exam: Vitals:   11/02/20 0956  BP: 118/74  Pulse: (!) 59  SpO2: 98%  Weight: 208 lb 4.8 oz (94.5 kg)  Height: 5\' 4"  (1.626 m)    GEN- The patient is well appearing, alert and oriented x 3 today.   HEENT: normocephalic, atraumatic; sclera clear, conjunctiva pink; hearing intact; oropharynx clear; neck supple, no JVP Lymph- no cervical lymphadenopathy Lungs- Clear to ausculation bilaterally, normal work of breathing.  No wheezes, rales, rhonchi Heart- Regular rate and rhythm, no murmurs, rubs or gallops, PMI not laterally displaced GI- soft, non-tender, non-distended, bowel sounds present, no hepatosplenomegaly Extremities- no clubbing, cyanosis, or  edema; DP/PT/radial pulses 2+ bilaterally MS- no significant deformity or atrophy Skin- warm and dry, no rash or lesion Psych- euthymic mood, full affect Neuro- strength and sensation are intact  EKG is ordered. Personal review of EKG from today shows sinus bradycardia at 59 bpm. PR interval and QRS stable from last two EKGs (07/2020 and 11/2019)  Additional studies reviewed include: Previous EP office notes.   Assessment and Plan:  1. PVC' s - Increase over the past 3 weeks.  Only recent changes were COVID booster 4 weeks ago, and recent Vitamin D increase.  It's possible she could be having some exacerbation in relation to the COVID booster, especially having had COVID last year.  Labs today.  7 day Zio to further quantify her palpitations.  EKG stable today on flecainide. May need to consider increase, carefully with 1st degree AV block and reports of daytime bradycardia. 2. Obesity -  Working with Weight management clinic.  3. HTN  Stable today.  4. Sleep disorder breathing Had overnight oximetry last night for possible CPAP 5. Sinus bradycardia Reports HRs in 40s at time. ? PVCs causing bradysphygmia by apple watch vs true brady. Monitor will help sort out.   Labs and Zio ordered today. RTC 4 weeks. Sooner pending results.   Graciella Freer, PA-C  11/02/20 10:08 AM

## 2020-11-01 ENCOUNTER — Encounter: Payer: Self-pay | Admitting: Cardiology

## 2020-11-02 ENCOUNTER — Ambulatory Visit (INDEPENDENT_AMBULATORY_CARE_PROVIDER_SITE_OTHER): Payer: 59 | Admitting: Student

## 2020-11-02 ENCOUNTER — Ambulatory Visit (INDEPENDENT_AMBULATORY_CARE_PROVIDER_SITE_OTHER): Payer: 59

## 2020-11-02 ENCOUNTER — Encounter: Payer: Self-pay | Admitting: Student

## 2020-11-02 ENCOUNTER — Other Ambulatory Visit: Payer: Self-pay

## 2020-11-02 ENCOUNTER — Telehealth: Payer: Self-pay | Admitting: *Deleted

## 2020-11-02 ENCOUNTER — Encounter (INDEPENDENT_AMBULATORY_CARE_PROVIDER_SITE_OTHER): Payer: Self-pay | Admitting: Family Medicine

## 2020-11-02 ENCOUNTER — Ambulatory Visit (INDEPENDENT_AMBULATORY_CARE_PROVIDER_SITE_OTHER): Payer: 59 | Admitting: Family Medicine

## 2020-11-02 VITALS — BP 133/74 | HR 57 | Temp 97.9°F | Ht 65.0 in | Wt 204.0 lb

## 2020-11-02 VITALS — BP 118/74 | HR 59 | Ht 64.0 in | Wt 208.3 lb

## 2020-11-02 DIAGNOSIS — R002 Palpitations: Secondary | ICD-10-CM | POA: Diagnosis not present

## 2020-11-02 DIAGNOSIS — G4733 Obstructive sleep apnea (adult) (pediatric): Secondary | ICD-10-CM

## 2020-11-02 DIAGNOSIS — I10 Essential (primary) hypertension: Secondary | ICD-10-CM | POA: Diagnosis not present

## 2020-11-02 DIAGNOSIS — Z6837 Body mass index (BMI) 37.0-37.9, adult: Secondary | ICD-10-CM

## 2020-11-02 DIAGNOSIS — I493 Ventricular premature depolarization: Secondary | ICD-10-CM

## 2020-11-02 DIAGNOSIS — E559 Vitamin D deficiency, unspecified: Secondary | ICD-10-CM | POA: Diagnosis not present

## 2020-11-02 LAB — COMPREHENSIVE METABOLIC PANEL
ALT: 46 IU/L — ABNORMAL HIGH (ref 0–32)
AST: 25 IU/L (ref 0–40)
Albumin/Globulin Ratio: 1.9 (ref 1.2–2.2)
Albumin: 4.5 g/dL (ref 3.8–4.8)
Alkaline Phosphatase: 59 IU/L (ref 44–121)
BUN/Creatinine Ratio: 19 (ref 12–28)
BUN: 17 mg/dL (ref 8–27)
Bilirubin Total: 0.7 mg/dL (ref 0.0–1.2)
CO2: 26 mmol/L (ref 20–29)
Calcium: 10 mg/dL (ref 8.7–10.3)
Chloride: 99 mmol/L (ref 96–106)
Creatinine, Ser: 0.88 mg/dL (ref 0.57–1.00)
Globulin, Total: 2.4 g/dL (ref 1.5–4.5)
Glucose: 104 mg/dL — ABNORMAL HIGH (ref 65–99)
Potassium: 4.2 mmol/L (ref 3.5–5.2)
Sodium: 138 mmol/L (ref 134–144)
Total Protein: 6.9 g/dL (ref 6.0–8.5)
eGFR: 75 mL/min/{1.73_m2} (ref >59)

## 2020-11-02 LAB — MAGNESIUM: Magnesium: 1.9 mg/dL (ref 1.6–2.3)

## 2020-11-02 LAB — TSH: TSH: 1.68 u[IU]/mL (ref 0.450–4.500)

## 2020-11-02 MED ORDER — METOPROLOL SUCCINATE ER 50 MG PO TB24: ORAL_TABLET | ORAL | 3 refills | Status: DC

## 2020-11-02 MED ORDER — VITAMIN D (ERGOCALCIFEROL) 1.25 MG (50000 UNIT) PO CAPS: 50000.0000 [IU] | ORAL_CAPSULE | ORAL | 0 refills | Status: DC

## 2020-11-02 NOTE — Telephone Encounter (Signed)
Your provider has ordered a 7 day long term holter monitor.  You have been provided instructions for a ZIO monitor from a company called [employer].  This company is not in network with your insurance.  I will enroll you for a different monitor company, [employer] to ship a patch 7 day holter monitor to your home by [place].  This is waterproof patch provided by a company that is in network with [employer].  The instructions will be included in the monitor kit.  If you have any questions , please call Jerrico Covello in monitors at [phone] or [employer] at [phone].

## 2020-11-02 NOTE — Progress Notes (Unsigned)
Patient enrolled for [employer] to ship a 7 day long term holter monitor to her home.  Patient has [facility] which is not in network with [employer]/ ZIO.

## 2020-11-02 NOTE — Patient Instructions (Addendum)
Medication Instructions:  Your physician recommends that you continue on your current medications as directed. Please refer to the Current Medication list given to you today.  *If you need a refill on your cardiac medications before your next appointment, please call your pharmacy*   Lab Work: TODAY: CMET, Mg, TSH  If you have labs (blood work) drawn today and your tests are completely normal, you will receive your results only by: MyChart Message (if you have MyChart) OR A paper copy in the mail If you have any lab test that is abnormal or we need to change your treatment, we will call you to review the results.   Follow-Up: At [facility], you and your health needs are our priority.  As part of our continuing mission to provide you with exceptional heart care, we have created designated Provider Care Teams.  These Care Teams include your primary Cardiologist (physician) and Advanced Practice Providers (APPs -  Physician Assistants and Nurse Practitioners) who all work together to provide you with the care you need, when you need it.  Your next appointment:   As scheduled  Other Instructions ZIO XT- Long Term Monitor Instructions  Your physician has requested you wear a ZIO patch monitor for 7 days.  This is a single patch monitor. [employer] supplies one patch monitor per enrollment. Additional stickers are not available. Please do not apply patch if you will be having a Nuclear Stress Test,  Echocardiogram, Cardiac CT, MRI, or Chest Xray during the period you would be wearing the  monitor. The patch cannot be worn during these tests. You cannot remove and re-apply the  ZIO XT patch monitor.  Your ZIO patch monitor will be mailed 3 day USPS to your address on file. It may take 3-5 days  to receive your monitor after you have been enrolled.  Once you have received your monitor, please review the enclosed instructions. Your monitor  has already been registered assigning a specific  monitor serial # to you.  Billing and Patient Assistance Program Information  We have supplied [employer] with any of your insurance information on file for billing purposes. [employer] offers a sliding scale Patient Assistance Program for patients that do not have  insurance, or whose insurance does not completely cover the cost of the ZIO monitor.  You must apply for the Patient Assistance Program to qualify for this discounted rate.  To apply, please call [employer] at [phone], select option 4, select option 2, ask to apply for  Patient Assistance Program. [name] will ask your household income, and how many people  are in your household. They will quote your out-of-pocket cost based on that information.  [employer] will also be able to set up a 22-month, interest-free payment plan if needed.  Applying the monitor   Shave hair from upper left chest.  Hold abrader disc by orange tab. Rub abrader in 40 strokes over the upper left chest as  indicated in your monitor instructions.  Clean area with 4 enclosed alcohol pads. Let dry.  Apply patch as indicated in monitor instructions. Patch will be placed under collarbone on left  side of chest with arrow pointing upward.  Rub patch adhesive wings for 2 minutes. Remove white label marked "1". Remove the white  label marked "2". Rub patch adhesive wings for 2 additional minutes.  While looking in a mirror, press and release button in center of patch. A small green light will  flash 3-4 times. This will be your only indicator  that the monitor has been turned on.  Do not shower for the first 24 hours. You may shower after the first 24 hours.  Press the button if you feel a symptom. You will hear a small click. Record Date, Time and  Symptom in the Patient Logbook.  When you are ready to remove the patch, follow instructions on the last 2 pages of Patient  Logbook. Stick patch monitor onto the last page of Patient Logbook.  Place Patient Logbook in the  blue and white box. Use locking tab on box and tape box closed  securely. The blue and white box has prepaid postage on it. Please place it in the mailbox as  soon as possible. Your physician should have your test results approximately 7 days after the  monitor has been mailed back to [facility].  Call [facility] Customer Care at [phone] if you have questions regarding  your ZIO XT patch monitor. Call them immediately if you see an orange light blinking on your  monitor.  If your monitor falls off in less than 4 days, contact our Monitor department at [phone].  If your monitor becomes loose or falls off after 4 days call [employer] at [phone] for  suggestions on securing your monitor

## 2020-11-03 NOTE — Progress Notes (Signed)
Chief Complaint:   OBESITY [name] is here to discuss her progress with her obesity treatment plan along with follow-up of her obesity related diagnoses. [name] is on the Category 2 Plan with lunch options or keeping a food journal and adhering to recommended goals of 1200-1400 calories and 80+ grams of protein daily and states she is following her eating plan approximately 90-95% of the time. [name] states she is walking and doing mixed cardio for 30 minutes 3-5 times per week.  Today's visit was #: 7 Starting weight: 228 lbs Starting date: 07/20/2020 Today's weight: 204 lbs Today's date: 11/02/2020 Total lbs lost to date: 24 Total lbs lost since last in-office visit: 2  Interim History: [name] continues to do well with weight loss. She is exercising regularly, but she is struggling with increased palpitations although this doesn't seem related to activity.  Subjective:   1. Vitamin D deficiency [name] is stable on Vit D, and she denies nausea, vomiting, or muscle weakness.  2. PVC (premature ventricular contraction) [name] has a history of PVC's but she notes increased palpitations and some mild elevated in exercise intolerance recently in spite of exercising and weight loss. She has an appointment with Cardiology to discuss.  Assessment/Plan:   1. Vitamin D deficiency Low Vitamin D level contributes to fatigue and are associated with obesity, breast, and colon cancer. We will refill prescription Vitamin D for 1 month. [name] will follow-up for routine testing of Vitamin D, at least 2-3 times per year to avoid over-replacement.  - Vitamin D, Ergocalciferol, (DRISDOL) 1.25 MG (50000 UNIT) CAPS capsule; Take 1 capsule (50,000 Units total) by mouth every 7 (seven) days.  Dispense: 4 capsule; Refill: 0  2. PVC (premature ventricular contraction) [name] was educated on PVC's and possible complications. She will follow up with Cardiology today and will make sure she continues to work  on diet, exercise, and weight loss.  3. Obesity with current BMI 33.9 [name] is currently in the action stage of change. As such, her goal is to continue with weight loss efforts. She has agreed to the Category 2 Plan or keeping a food journal and adhering to recommended goals of 1200-1400 calories and 80+ grams of protein daily.   Exercise goals: As is.  Behavioral modification strategies: increasing lean protein intake.  [name] has agreed to follow-up with our clinic in 2 to 3 weeks. She was informed of the importance of frequent follow-up visits to maximize her success with intensive lifestyle modifications for her multiple health conditions.   Objective:   Blood pressure 133/74, pulse (!) 57, temperature 97.9 F (36.6 C), height 5\' 5"  (1.651 m), weight 204 lb (92.5 kg), SpO2 97 %. Body mass index is 33.95 kg/m.  General: Cooperative, alert, well developed, in no acute distress. HEENT: Conjunctivae and lids unremarkable. Cardiovascular: Regular rhythm.  Lungs: Normal work of breathing. Neurologic: No focal deficits.   Lab Results  Component Value Date   CREATININE 0.88 11/02/2020   BUN 17 11/02/2020   NA 138 11/02/2020   K 4.2 11/02/2020   CL 99 11/02/2020   CO2 26 11/02/2020   Lab Results  Component Value Date   ALT 46 (H) 11/02/2020   AST 25 11/02/2020   ALKPHOS 59 11/02/2020   BILITOT 0.7 11/02/2020   Lab Results  Component Value Date   HGBA1C 6.0 (H) 07/20/2020   Lab Results  Component Value Date   INSULIN 17.8 07/20/2020   Lab Results  Component Value Date  TSH 1.680 11/02/2020   Lab Results  Component Value Date   CHOL 194 07/20/2020   HDL 55 07/20/2020   LDLCALC 123 (H) 07/20/2020   TRIG 87 07/20/2020   CHOLHDL 3.3 12/23/2018   Lab Results  Component Value Date   VD25OH 33.6 07/20/2020   Lab Results  Component Value Date   WBC 7.8 07/20/2020   HGB 13.3 07/20/2020   HCT 39.7 07/20/2020   MCV 91 07/20/2020   PLT 497 (H) 04/18/2019   Lab  Results  Component Value Date   FERRITIN 1,561 (H) 04/13/2019   Attestation Statements:   Reviewed by clinician on day of visit: allergies, medications, problem list, medical history, surgical history, family history, social history, and previous encounter notes.  Time spent on visit including pre-visit chart review and post-visit care and charting was 40 minutes.    I, Burt Knack, am acting as transcriptionist for Quillian Quince, MD.  I have reviewed the above documentation for accuracy and completeness, and I agree with the above. -  Quillian Quince, MD

## 2020-11-10 DIAGNOSIS — I493 Ventricular premature depolarization: Secondary | ICD-10-CM

## 2020-11-16 ENCOUNTER — Other Ambulatory Visit: Payer: Self-pay

## 2020-11-16 ENCOUNTER — Ambulatory Visit (INDEPENDENT_AMBULATORY_CARE_PROVIDER_SITE_OTHER): Payer: 59 | Admitting: Family Medicine

## 2020-11-16 ENCOUNTER — Encounter (INDEPENDENT_AMBULATORY_CARE_PROVIDER_SITE_OTHER): Payer: Self-pay | Admitting: Family Medicine

## 2020-11-16 VITALS — BP 134/79 | HR 56 | Temp 98.3°F | Ht 64.0 in | Wt 203.0 lb

## 2020-11-16 DIAGNOSIS — Z6837 Body mass index (BMI) 37.0-37.9, adult: Secondary | ICD-10-CM

## 2020-11-16 DIAGNOSIS — Z9189 Other specified personal risk factors, not elsewhere classified: Secondary | ICD-10-CM | POA: Diagnosis not present

## 2020-11-16 DIAGNOSIS — E559 Vitamin D deficiency, unspecified: Secondary | ICD-10-CM

## 2020-11-16 DIAGNOSIS — M25561 Pain in right knee: Secondary | ICD-10-CM

## 2020-11-16 MED ORDER — VITAMIN D (ERGOCALCIFEROL) 1.25 MG (50000 UNIT) PO CAPS: 50000.0000 [IU] | ORAL_CAPSULE | ORAL | 0 refills | Status: DC

## 2020-11-16 NOTE — Progress Notes (Signed)
Chief Complaint:   OBESITY [name] is here to discuss her progress with her obesity treatment plan along with follow-up of her obesity related diagnoses. [name] is on the Category 2 Plan or keeping a food journal and adhering to recommended goals of 1200-1400 calories and 80+ grams of protein daily and states she is following her eating plan approximately 95-100% of the time. [name] states she is walking and doing mix cardio for 30 minutes 3 times per week.  Today's visit was #: 8 Starting weight: 228 lbs Starting date: 07/20/2020 Today's weight: 203 lbs Today's date: 11/16/2020 Total lbs lost to date: 25 Total lbs lost since last in-office visit: 1  Interim History: [name] continues to do well with weight loss on her plan. Her exercise is limited due to knee pain. She is doing well overall with weight loss and her short term goal is to be below 200 lbs before the holiday.  Subjective:   1. Vitamin D deficiency [name] is stable on Vit D, but her last level was not yet at goal.   2. Right knee pain, unspecified chronicity [name] did not have an injury that she knows of, pain mostly in posterior knee. No erythema or obvious edema, and she is able to bear weight.  3. At risk for activity intolerance [name] is at risk for exercise intolerance due to knee pain.  Assessment/Plan:   1. Vitamin D deficiency Low Vitamin D level contributes to fatigue and are associated with obesity, breast, and colon cancer. We will refill prescription Vitamin D for 1 month. Anays will follow-up for routine testing of Vitamin D, at least 2-3 times per year to avoid over-replacement.  - Vitamin D, Ergocalciferol, (DRISDOL) 1.25 MG (50000 UNIT) CAPS capsule; Take 1 capsule (50,000 Units total) by mouth every 7 (seven) days.  Dispense: 4 capsule; Refill: 0  2. Right knee pain, unspecified chronicity [name] was advised to avoid strenuous exercise, but to ambulate carefully and alternate between Ice and  heat. She is to contact us or her primary care physician if her symptoms worsens or fail to improve.  3. At risk for activity intolerance [name] was given approximately 15 minutes of exercise intolerance counseling today. She is 60 y.o. female and has risk factors exercise intolerance including obesity. We discussed intensive lifestyle modifications today with an emphasis on specific weight loss instructions and strategies. [name] will slowly increase activity as tolerated.  Repetitive spaced learning was employed today to elicit superior memory formation and behavioral change.  4. Obesity with current BMI 34.9 [name] is currently in the action stage of change. As such, her goal is to continue with weight loss efforts. She has agreed to the Category 2 Plan or keeping a food journal and adhering to recommended goals of 1200-1400 calories and 80+ grams of protein daily.   Exercise goals: As is.  Behavioral modification strategies: increasing lean protein intake.  [name] has agreed to follow-up with our clinic in 3 weeks. She was informed of the importance of frequent follow-up visits to maximize her success with intensive lifestyle modifications for her multiple health conditions.   Objective:   Blood pressure 134/79, pulse (!) 56, temperature 98.3 F (36.8 C), height 5\' 4"  (1.626 m), SpO2 98 %. Body mass index is 35.75 kg/m.  General: Cooperative, alert, well developed, in no acute distress. HEENT: Conjunctivae and lids unremarkable. Cardiovascular: Regular rhythm.  Lungs: Normal work of breathing. Neurologic: No focal deficits.   Lab Results  Component Value Date  CREATININE 0.88 11/02/2020   BUN 17 11/02/2020   NA 138 11/02/2020   K 4.2 11/02/2020   CL 99 11/02/2020   CO2 26 11/02/2020   Lab Results  Component Value Date   ALT 46 (H) 11/02/2020   AST 25 11/02/2020   ALKPHOS 59 11/02/2020   BILITOT 0.7 11/02/2020   Lab Results  Component Value Date   HGBA1C 6.0 (H)  07/20/2020   Lab Results  Component Value Date   INSULIN 17.8 07/20/2020   Lab Results  Component Value Date   TSH 1.680 11/02/2020   Lab Results  Component Value Date   CHOL 194 07/20/2020   HDL 55 07/20/2020   LDLCALC 123 (H) 07/20/2020   TRIG 87 07/20/2020   CHOLHDL 3.3 12/23/2018   Lab Results  Component Value Date   VD25OH 33.6 07/20/2020   Lab Results  Component Value Date   WBC 7.8 07/20/2020   HGB 13.3 07/20/2020   HCT 39.7 07/20/2020   MCV 91 07/20/2020   PLT 497 (H) 04/18/2019   Lab Results  Component Value Date   FERRITIN 1,561 (H) 04/13/2019   Attestation Statements:   Reviewed by clinician on day of visit: allergies, medications, problem list, medical history, surgical history, family history, social history, and previous encounter notes.   I, Burt Knack, am acting as transcriptionist for Quillian Quince, MD.  I have reviewed the above documentation for accuracy and completeness, and I agree with the above. -  Quillian Quince, MD

## 2020-11-25 ENCOUNTER — Other Ambulatory Visit: Payer: Self-pay

## 2020-11-25 ENCOUNTER — Emergency Department (HOSPITAL_BASED_OUTPATIENT_CLINIC_OR_DEPARTMENT_OTHER)
Admission: EM | Admit: 2020-11-25 | Discharge: 2020-11-25 | Disposition: A | Discharge status: Home / Self Care | Payer: 59 | Attending: Emergency Medicine | Admitting: Emergency Medicine

## 2020-11-25 ENCOUNTER — Emergency Department (HOSPITAL_BASED_OUTPATIENT_CLINIC_OR_DEPARTMENT_OTHER): Payer: 59

## 2020-11-25 ENCOUNTER — Encounter (HOSPITAL_BASED_OUTPATIENT_CLINIC_OR_DEPARTMENT_OTHER): Payer: Self-pay | Admitting: Obstetrics and Gynecology

## 2020-11-25 DIAGNOSIS — M79661 Pain in right lower leg: Secondary | ICD-10-CM | POA: Insufficient documentation

## 2020-11-25 DIAGNOSIS — Z79899 Other long term (current) drug therapy: Secondary | ICD-10-CM | POA: Insufficient documentation

## 2020-11-25 DIAGNOSIS — R2241 Localized swelling, mass and lump, right lower limb: Secondary | ICD-10-CM | POA: Diagnosis not present

## 2020-11-25 DIAGNOSIS — M7121 Synovial cyst of popliteal space [Baker], right knee: Secondary | ICD-10-CM | POA: Insufficient documentation

## 2020-11-25 DIAGNOSIS — I10 Essential (primary) hypertension: Secondary | ICD-10-CM | POA: Insufficient documentation

## 2020-11-25 DIAGNOSIS — M79604 Pain in right leg: Secondary | ICD-10-CM | POA: Diagnosis present

## 2020-11-25 DIAGNOSIS — Z7982 Long term (current) use of aspirin: Secondary | ICD-10-CM | POA: Diagnosis not present

## 2020-11-25 DIAGNOSIS — Z8616 Personal history of COVID-19: Secondary | ICD-10-CM | POA: Diagnosis not present

## 2020-11-25 NOTE — Discharge Instructions (Addendum)
If you develop fever, redness to the skin, new or worsening pain or swelling, or any other new/concerning symptoms then return to the ER for evaluation.

## 2020-11-25 NOTE — ED Triage Notes (Signed)
Patient reports right leg pain that has been worsening over the past few days. Endorses pain behind the knee and swelling to the leg. Denies trauma, reports 1 baby aspirin daily.

## 2020-11-25 NOTE — ED Notes (Signed)
This RN presented the AVS utilizing Teachback Method. Patient verbalizes understanding of Discharge Instructions. Opportunity for Questioning and Answers were provided. Patient Discharged from ED ambulatory to Home via SELF  

## 2020-11-25 NOTE — ED Provider Notes (Signed)
[facility] EMERGENCY DEPT Provider Note   CSN: [id number] Arrival date & time: 11/25/20  1626     History Chief Complaint  Patient presents with   Leg Pain    [name] is a 60 y.o. female.  HPI 60 year old female presents with right leg pain and swelling.  She states the pain started off as a pain behind her right knee and now she is also having swelling and pain in her calf.  No weakness or numbness.  No trauma.  She can bend and straighten it though it is a little painful at the extremes.  No fevers, chest pain, shortness of breath.  She has not take anything for it.  Rates the pain is moderate. No left leg symptoms.  Past Medical History:  Diagnosis Date   Back pain    Constipation    Heart burn    Hyperlipidemia    Hypertension    IBS (irritable bowel syndrome)    Joint pain    Lactose intolerance    Muscle pain    Other fatigue    Palpitations    Pre-diabetes    PVC (premature ventricular contraction)    Ringing in ear    Sleep apnea    SOB (shortness of breath) on exertion    Vitamin D deficiency     Patient Active Problem List   Diagnosis Date Noted   Pneumonia due to COVID-19 virus 04/13/2019   Hypokalemia 04/13/2019   Primary osteoarthritis of first carpometacarpal joint of left hand 05/03/2015   Radial styloid tenosynovitis 05/03/2015   PVC (premature ventricular contraction) 02/05/2014   Benign essential HTN 02/05/2014   Dyslipidemia 02/05/2014    Past Surgical History:  Procedure Laterality Date   APPENDECTOMY     carpal tunn     CESAREAN SECTION     NEUROMA SURGERY     SEPTOPLASTY     TONSILLECTOMY     TOTAL ABDOMINAL HYSTERECTOMY W/ BILATERAL SALPINGOOPHORECTOMY     TUBAL LIGATION       OB History     Gravida  3   Para  3   Term  3   Preterm      AB      Living  3      SAB      IAB      Ectopic      Multiple      Live Births              Family History  Problem Relation Age of Onset    Sudden death Mother    Hypertension Father    Stroke Father    Cancer Father    Alcoholism Father    Hyperlipidemia Sister    Hyperlipidemia Brother     Social History   Tobacco Use   Smoking status: Never   Smokeless tobacco: Never  Vaping Use   Vaping Use: Never used  Substance Use Topics   Alcohol use: Yes    Alcohol/week: 0.0 standard drinks    Comment: occsaionl wine   Drug use: Never    Home Medications Prior to Admission medications   Medication Sig Start Date End Date Taking? Authorizing Provider  aspirin 81 MG chewable tablet Chew 1 tablet (81 mg total) by mouth daily. 05/02/19   Leroy Sea, MD  atorvastatin (LIPITOR) 40 MG tablet Take 1 tablet (40 mg total) by mouth daily. 03/25/20   Quintella Reichert, MD  azelastine (ASTELIN) 0.1 % nasal spray  Place into both nostrils 2 (two) times daily. Use in each nostril as directed    [provider]  carboxymethylcellulose (REFRESH PLUS) 0.5 % SOLN 1 drop. 2 drops each eye    [provider]  cholecalciferol (VITAMIN D) 1000 UNITS tablet Take 1,000 Units by mouth daily.    [provider]  estradiol (VIVELLE-DOT) 0.05 MG/24HR patch Place 1 patch onto the skin 2 (two) times a week.  01/08/14   [provider]  famotidine (PEPCID) 20 MG tablet Take 1 tablet (20 mg total) by mouth daily. 03/25/20   Quintella Reichert, MD  flecainide (TAMBOCOR) 150 MG tablet Take 1/2 (one-half) tablet by mouth twice daily 07/06/20   Marinus Maw, MD  hydrochlorothiazide (HYDRODIURIL) 25 MG tablet Take 1 tablet (25 mg total) by mouth daily. 10/19/20   Quillian Quince D, MD  Ipratropium-Albuterol (COMBIVENT RESPIMAT) 20-100 MCG/ACT AERS respimat Inhale 1 puff into the lungs every 6 (six) hours.    [provider]  meloxicam (MOBIC) 15 MG tablet Take 15 mg by mouth daily.    [provider]  metoprolol succinate (TOPROL-XL) 50 MG 24 hr tablet TAKE 1 TABLET BY MOUTH ONCE DAILY **TAKE  WITH  OR   IMMEDIATELY  FOLLOWING  A  MEAL 11/02/20   Graciella Freer, PA-C  montelukast (SINGULAIR) 10 MG tablet Take 10 mg by mouth at bedtime.    [provider]  potassium chloride 20 MEQ TBCR Take 20 mEq by mouth daily. 02/08/14   Quintella Reichert, MD  PROAIR HFA 108 [phone] Base) MCG/ACT inhaler Inhale 2 puffs into the lungs as needed for shortness of breath. 01/30/18   [provider]  Probiotic Product (PROBIOTIC-10 PO) Take by mouth as needed.    [provider]  Vitamin D, Ergocalciferol, (DRISDOL) 1.25 MG (50000 UNIT) CAPS capsule Take 1 capsule (50,000 Units total) by mouth every 7 (seven) days. 11/16/20   Quillian Quince D, MD    Allergies    Buprenorphine hcl, Morphine and related, Morphine sulfate, Other, and Short ragweed pollen ext  Review of Systems   Review of Systems  Constitutional:  Negative for fever.  Respiratory:  Negative for shortness of breath.   Cardiovascular:  Positive for leg swelling. Negative for chest pain.  Neurological:  Negative for weakness and numbness.  All other systems reviewed and are negative.  Physical Exam Updated Vital Signs BP (!) 142/76 (BP Location: Right Arm)   Pulse 60   Temp 98.1 F (36.7 C)   Resp 16   Ht 5\' 4"  (1.626 m)   Wt 92.1 kg   SpO2 98%   BMI 34.85 kg/m   Physical Exam Vitals and nursing note reviewed.  Constitutional:      Appearance: She is well-developed.  HENT:     Head: Normocephalic and atraumatic.     Right Ear: External ear normal.     Left Ear: External ear normal.     Nose: Nose normal.  Eyes:     General:        Right eye: No discharge.        Left eye: No discharge.  Cardiovascular:     Rate and Rhythm: Normal rate and regular rhythm.     Pulses:          Dorsalis pedis pulses are 2+ on the right side.  Pulmonary:     Effort: Pulmonary effort is normal.  Abdominal:     General: There is no distension.  Musculoskeletal:     Right knee: No bony tenderness. Normal range of  motion.     Right lower leg: Swelling and tenderness present. No bony tenderness.     Right ankle:     Right Achilles Tendon: No tenderness or defects.     Comments: TTP in posterior knee. Diffuse swelling to calf. No redness. Grossly normal sensation in bilateral feet  Skin:    General: Skin is warm and dry.  Neurological:     Mental Status: She is alert.  Psychiatric:        Mood and Affect: Mood is not anxious.    ED Results / Procedures / Treatments   Labs (all labs ordered are listed, but only abnormal results are displayed) Labs Reviewed - No data to display  EKG None  Radiology US Venous Img Lower Unilateral Right  Result Date: 11/25/2020 CLINICAL DATA:  RIGHT lower extremity posterior knee pain and swelling for 2 weeks. EXAM: RIGHT LOWER EXTREMITY VENOUS DOPPLER ULTRASOUND TECHNIQUE: Gray-scale sonography with compression, as well as color and duplex ultrasound, were performed to evaluate the deep venous system(s) from the level of the common femoral vein through the popliteal and proximal calf veins. COMPARISON:  None FINDINGS: VENOUS Normal compressibility of the common femoral, superficial femoral, and popliteal veins, as well as the visualized calf veins. Visualized portions of profunda femoral vein and great saphenous vein unremarkable. No filling defects to suggest DVT on grayscale or color Doppler imaging. Doppler waveforms show normal direction of venous flow, normal respiratory plasticity and response to augmentation. Limited views of the contralateral common femoral vein are unremarkable. OTHER RIGHT posteromedial calf posterior knee, complex fluid collection 8.7 x 1.8 x 3.4 cm with heterogeneous internal echogenicity extending from deep between muscular structures to superficial overlying muscular structures. No visible flow in this area. Limitations: None IMPRESSION: Negative for lower extremity DVT of the RIGHT lower extremity. Heterogeneous collection in the  posteromedial RIGHT upper calf posterior to the knee likely a ruptured or complicated Baker's cyst or hematoma. Correlate with clinical symptoms or any signs of infection or history of prior trauma and consider repeat imaging as warranted. Electronically Signed   By: Donzetta Kohut M.D.   On: 11/25/2020 20:27    Procedures Procedures   Medications Ordered in ED Medications - No data to display  ED Course  I have reviewed the triage vital signs and the nursing notes.  Pertinent labs & imaging results that were available during my care of the patient were reviewed by me and considered in my medical decision making (see chart for details).    MDM Rules/Calculators/A&P                           DVT ultrasound shows no DVT but does show what is likely a Baker's cyst.  No evidence of cellulitis on exam.  At this point I think she can be treated conservatively and follow-up with her PCP as an outpatient. Final Clinical Impression(s) / ED Diagnoses Final diagnoses:  Baker cyst, right    Rx / DC Orders ED Discharge Orders     None        Pricilla Loveless, MD 11/25/20 2325

## 2020-11-29 NOTE — Progress Notes (Signed)
PCP:  Shirlean Mylar, MD Primary Cardiologist: Armanda Magic, MD Electrophysiologist: Lewayne Bunting, MD   [name] is a 60 y.o. female seen today for Lewayne Bunting, MD for routine electrophysiology followup.  Since last being seen in our clinic the patient reports doing well overall. She clarifies that she does not have undue fatigue, SOB, dizziness, or lightheadedness, but has primarily been concerned about the actual number of her HR.   Past Medical History:  Diagnosis Date   Back pain    Baker's cyst of knee, right    Constipation    Heart burn    Hyperlipidemia    Hypertension    IBS (irritable bowel syndrome)    Joint pain    Lactose intolerance    Muscle pain    Other fatigue    Palpitations    Pre-diabetes    PVC (premature ventricular contraction)    Ringing in ear    Sleep apnea    SOB (shortness of breath) on exertion    Vitamin D deficiency    Past Surgical History:  Procedure Laterality Date   APPENDECTOMY     carpal tunn     CESAREAN SECTION     NEUROMA SURGERY     SEPTOPLASTY     TONSILLECTOMY     TOTAL ABDOMINAL HYSTERECTOMY W/ BILATERAL SALPINGOOPHORECTOMY     TUBAL LIGATION      Current Outpatient Medications  Medication Sig Dispense Refill   aspirin 81 MG chewable tablet Chew 1 tablet (81 mg total) by mouth daily.     atorvastatin (LIPITOR) 40 MG tablet Take 1 tablet (40 mg total) by mouth daily. 90 tablet 3   azelastine (ASTELIN) 0.1 % nasal spray Place into both nostrils 2 (two) times daily. Use in each nostril as directed     carboxymethylcellulose (REFRESH PLUS) 0.5 % SOLN 1 drop. 2 drops each eye     cholecalciferol (VITAMIN D) 1000 UNITS tablet Take 1,000 Units by mouth daily.     estradiol (VIVELLE-DOT) 0.05 MG/24HR patch Place 1 patch onto the skin 2 (two) times a week.   0   famotidine (PEPCID) 20 MG tablet Take 1 tablet (20 mg total) by mouth daily. 90 tablet 3   flecainide (TAMBOCOR) 150 MG tablet Take 1/2 (one-half) tablet by  mouth twice daily 90 tablet 2   hydrochlorothiazide (HYDRODIURIL) 25 MG tablet Take 1 tablet (25 mg total) by mouth daily. 30 tablet 0   Ipratropium-Albuterol (COMBIVENT RESPIMAT) 20-100 MCG/ACT AERS respimat Inhale 1 puff into the lungs every 6 (six) hours.     meloxicam (MOBIC) 15 MG tablet Take 15 mg by mouth daily.     metoprolol succinate (TOPROL-XL) 50 MG 24 hr tablet TAKE 1 TABLET BY MOUTH ONCE DAILY **TAKE  WITH  OR  IMMEDIATELY  FOLLOWING  A  MEAL 90 tablet 3   montelukast (SINGULAIR) 10 MG tablet Take 10 mg by mouth at bedtime.     potassium chloride 20 MEQ TBCR Take 20 mEq by mouth daily. 30 tablet 3   PROAIR HFA 108 (90 Base) MCG/ACT inhaler Inhale 2 puffs into the lungs as needed for shortness of breath.  5   Probiotic Product (PROBIOTIC-10 PO) Take by mouth as needed.     Vitamin D, Ergocalciferol, (DRISDOL) 1.25 MG (50000 UNIT) CAPS capsule Take 1 capsule (50,000 Units total) by mouth every 7 (seven) days. 4 capsule 0   No current facility-administered medications for this visit.    Allergies  Allergen Reactions  Buprenorphine Hcl Itching   Morphine And Related Itching   Morphine Sulfate Other (See Comments)   Other    Short Ragweed Pollen Ext Hives    Social History   Socioeconomic History   Marital status: Married    Spouse name: Not on file   Number of children: Not on file   Years of education: Not on file   Highest education level: Not on file  Occupational History   Occupation: Stay at home spouse  Tobacco Use   Smoking status: Never   Smokeless tobacco: Never  Vaping Use   Vaping Use: Never used  Substance and Sexual Activity   Alcohol use: Yes    Alcohol/week: 0.0 standard drinks    Comment: occsaionl wine   Drug use: Never   Sexual activity: Not on file  Other Topics Concern   Not on file  Social History Narrative   Tobacco use   Cigarettes: Never smoked   Tobacco history last updated 11/03/2013   No smoking   No alcohol    Yes caffeine    Recreational drug use  : no   Diet : regular    Exercise, Minimal   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   Social Determinants of Health   Financial Resource Strain: Not on file  Food Insecurity: Not on file  Transportation Needs: Not on file  Physical Activity: Not on file  Stress: Not on file  Social Connections: Not on file  Intimate Partner Violence: Not on file     Review of Systems: All other systems reviewed and are otherwise negative except as noted above.  Physical Exam: Vitals:   11/30/20 0925  BP: 130/82  Pulse: (!) 57  SpO2: 97%  Weight: 204 lb 6.4 oz (92.7 kg)  Height: 5\' 4"  (1.626 m)    GEN- The patient is well appearing, alert and oriented x 3 today.   HEENT: normocephalic, atraumatic; sclera clear, conjunctiva pink; hearing intact; oropharynx clear; neck supple, no JVP Lymph- no cervical lymphadenopathy Lungs- Clear to ausculation bilaterally, normal work of breathing.  No wheezes, rales, rhonchi Heart- Regular rate and rhythm, no murmurs, rubs or gallops, PMI not laterally displaced GI- soft, non-tender, non-distended, bowel sounds present, no hepatosplenomegaly Extremities- no clubbing, cyanosis, or edema; DP/PT/radial pulses 2+ bilaterally MS- no significant deformity or atrophy Skin- warm and dry, no rash or lesion Psych- euthymic mood, full affect Neuro- strength and sensation are intact  EKG is not ordered.   Additional studies reviewed include: Previous EP office notes.   Monitor 10/2020 (formal read pending) The observed rhythms are sinus bradycardia to sinus tachycardia. *The Maximum Heart Rate recorded was 140 bpm, Day 2 / 08:28:47 am, the Minimum Heart Rate recorded was 41 bpm, Day 4 / 06:52:51 am and the Average Heart Rate was 59 bpm. *There were 491 PVCs with a burden of 0.08 %. *There were 23 PSVCs with a burden of < 0.01 %.  There was 1 occurrence of Supraventricular Tachycardia with the longest episode 3 beats, Day 2 / 06:57:19 am and the fastest episode 122 bpm, Day 2 / 06:57:19 am. *There were 25 Patient reported events.  Assessment and Plan:  1. PVC' s - 7 day patch with PVC burden of less than <1%. Pt symptoms were associated with various rhythms including NSR, PVCs, Sinus tachy, and short runs of SVT (  3 beats).  2. Obesity -  Working with Weight management clinic.  Body mass index is 35.09 kg/m.  3. HTN Stable on current regimen   4. Sleep disorder breathing Had overnight oximetry previously for possible OSA. She has been told she will need CPAP.  Encouraged that some of her fatigue may be from OSA 5. Sinus bradycardia Her AVG HR is upper 50s. She does have intermittent daytime HRs in the upper 40s, and nocturnal HRs that were in the lower 40s that do not appear to have been symptomatic by monitoring.  RTC 6 months with taylor. Sooner with symptoms.   Graciella Freer, PA-C  11/30/20 9:42 AM

## 2020-11-30 ENCOUNTER — Encounter: Payer: Self-pay | Admitting: Neurology

## 2020-11-30 ENCOUNTER — Encounter: Payer: Self-pay | Admitting: Student

## 2020-11-30 ENCOUNTER — Ambulatory Visit (INDEPENDENT_AMBULATORY_CARE_PROVIDER_SITE_OTHER): Payer: 59 | Admitting: Family Medicine

## 2020-11-30 ENCOUNTER — Ambulatory Visit (INDEPENDENT_AMBULATORY_CARE_PROVIDER_SITE_OTHER): Payer: 59 | Admitting: Student

## 2020-11-30 ENCOUNTER — Encounter (INDEPENDENT_AMBULATORY_CARE_PROVIDER_SITE_OTHER): Payer: Self-pay | Admitting: Family Medicine

## 2020-11-30 ENCOUNTER — Other Ambulatory Visit: Payer: Self-pay

## 2020-11-30 VITALS — BP 149/73 | HR 55 | Temp 97.7°F | Ht 64.0 in | Wt 200.0 lb

## 2020-11-30 VITALS — BP 130/82 | HR 57 | Ht 64.0 in | Wt 204.4 lb

## 2020-11-30 DIAGNOSIS — M7121 Synovial cyst of popliteal space [Baker], right knee: Secondary | ICD-10-CM | POA: Insufficient documentation

## 2020-11-30 DIAGNOSIS — R001 Bradycardia, unspecified: Secondary | ICD-10-CM

## 2020-11-30 DIAGNOSIS — E559 Vitamin D deficiency, unspecified: Secondary | ICD-10-CM | POA: Diagnosis not present

## 2020-11-30 DIAGNOSIS — Z9189 Other specified personal risk factors, not elsewhere classified: Secondary | ICD-10-CM | POA: Diagnosis not present

## 2020-11-30 DIAGNOSIS — I493 Ventricular premature depolarization: Secondary | ICD-10-CM | POA: Diagnosis not present

## 2020-11-30 DIAGNOSIS — R0683 Snoring: Secondary | ICD-10-CM

## 2020-11-30 DIAGNOSIS — Z6838 Body mass index (BMI) 38.0-38.9, adult: Secondary | ICD-10-CM

## 2020-11-30 DIAGNOSIS — I10 Essential (primary) hypertension: Secondary | ICD-10-CM

## 2020-11-30 DIAGNOSIS — R002 Palpitations: Secondary | ICD-10-CM

## 2020-11-30 MED ORDER — VITAMIN D (ERGOCALCIFEROL) 1.25 MG (50000 UNIT) PO CAPS: 50000.0000 [IU] | ORAL_CAPSULE | ORAL | 0 refills | Status: DC

## 2020-12-01 NOTE — Progress Notes (Signed)
Chief Complaint:   OBESITY [name] is here to discuss her progress with her obesity treatment plan along with follow-up of her obesity related diagnoses. [name] is on the Category 2 Plan or keeping a food journal and adhering to recommended goals of 1200-1400 calories and 80+ grams of protein daily and states she is following her eating plan approximately 95% of the time. [name] states she is doing 0 minutes 0 times per week.  Today's visit was #: 9 Starting weight: 228 lbs Starting date: 07/20/2020 Today's weight: 200 lbs Today's date: 11/30/2020 Total lbs lost to date: 28 Total lbs lost since last in-office visit: 3  Interim History: [name] continues to do well with weight loss on her Category 2 plan. Her hunger is controlled. She is a bit bored with her breakfast options.  Subjective:   1. Vitamin D deficiency [name] is stable on Vit D. She denies signs of over-replacement.  2. Essential hypertension [name]'s blood pressure is elevated today. She just saw Cardiology this morning for bradycardia. Her blood pressure was within normal limits there. She may be getting a obstructive sleep apnea workup.  3. At risk for heart disease [name] is at a higher than average risk for cardiovascular disease due to obesity.   Assessment/Plan:   1. Vitamin D deficiency Low Vitamin D level contributes to fatigue and are associated with obesity, breast, and colon cancer. We will refill prescription Vitamin D for 1 month. [name] will follow-up for routine testing of Vitamin D, at least 2-3 times per year to avoid over-replacement.  - Vitamin D, Ergocalciferol, (DRISDOL) 1.25 MG (50000 UNIT) CAPS capsule; Take 1 capsule (50,000 Units total) by mouth every 7 (seven) days.  Dispense: 4 capsule; Refill: 0  2. Essential hypertension [name] will continue with diet, exercise, and weight loss to improve blood pressure control. She will continue to follow up as directed.  3. At risk for heart  disease [name] was given approximately 15 minutes of coronary artery disease prevention counseling today. She is 60 y.o. female and has risk factors for heart disease including obesity. We discussed intensive lifestyle modifications today with an emphasis on specific weight loss instructions and strategies.   Repetitive spaced learning was employed today to elicit superior memory formation and behavioral change.  4. Obesity with current BMI 34.4 [name] is currently in the action stage of change. As such, her goal is to continue with weight loss efforts. She has agreed to the Category 2 Plan.   Breakfast recipes were discussed today.  Behavioral modification strategies: meal planning and cooking strategies.  [name] has agreed to follow-up with our clinic in 3 weeks. She was informed of the importance of frequent follow-up visits to maximize her success with intensive lifestyle modifications for her multiple health conditions.   Objective:   Blood pressure (!) 149/73, pulse (!) 55, temperature 97.7 F (36.5 C), height 5\' 4"  (1.626 m), weight 200 lb (90.7 kg), SpO2 99 %. Body mass index is 34.33 kg/m.  General: Cooperative, alert, well developed, in no acute distress. HEENT: Conjunctivae and lids unremarkable. Cardiovascular: Regular rhythm.  Lungs: Normal work of breathing. Neurologic: No focal deficits.   Lab Results  Component Value Date   CREATININE 0.88 11/02/2020   BUN 17 11/02/2020   NA 138 11/02/2020   K 4.2 11/02/2020   CL 99 11/02/2020   CO2 26 11/02/2020   Lab Results  Component Value Date   ALT 46 (H) 11/02/2020   AST 25 11/02/2020  ALKPHOS 59 11/02/2020   BILITOT 0.7 11/02/2020   Lab Results  Component Value Date   HGBA1C 6.0 (H) 07/20/2020   Lab Results  Component Value Date   INSULIN 17.8 07/20/2020   Lab Results  Component Value Date   TSH 1.680 11/02/2020   Lab Results  Component Value Date   CHOL 194 07/20/2020   HDL 55 07/20/2020   LDLCALC  123 (H) 07/20/2020   TRIG 87 07/20/2020   CHOLHDL 3.3 12/23/2018   Lab Results  Component Value Date   VD25OH 33.6 07/20/2020   Lab Results  Component Value Date   WBC 7.8 07/20/2020   HGB 13.3 07/20/2020   HCT 39.7 07/20/2020   MCV 91 07/20/2020   PLT 497 (H) 04/18/2019   Lab Results  Component Value Date   FERRITIN 1,561 (H) 04/13/2019   Attestation Statements:   Reviewed by clinician on day of visit: allergies, medications, problem list, medical history, surgical history, family history, social history, and previous encounter notes.   I, Burt Knack, am acting as transcriptionist for Quillian Quince, MD.  I have reviewed the above documentation for accuracy and completeness, and I agree with the above. -  Quillian Quince, MD

## 2020-12-07 ENCOUNTER — Telehealth: Payer: 59

## 2020-12-07 DIAGNOSIS — R0902 Hypoxemia: Secondary | ICD-10-CM

## 2020-12-07 NOTE — Telephone Encounter (Signed)
Lattie Haw, RN  12/07/2020  9:26 AM EDT Back to Top    Left message for patient to call back. CPAP titration order placed. Message sent to sleep pool for pre-cert.   Quintella Reichert, MD  Lattie Haw, RN; [name], CMA Recommend she go on CPAP - please set up for CPAP titration    Lattie Haw, RN  11/25/2020  2:44 PM EDT     The patient has been notified of the result and verbalized understanding.  All questions (if any) were answered. The patient is not on CPAP.   Quintella Reichert, MD  11/06/2020  8:52 PM EDT     Patient has mild hypoxemia on overnight pulse ox - is she on CPAP

## 2020-12-07 NOTE — Telephone Encounter (Signed)
Patient is returning Brittany's call.  

## 2020-12-08 NOTE — Telephone Encounter (Signed)
Pt is returning call.  

## 2020-12-09 NOTE — Telephone Encounter (Signed)
Return call: Patient called back and was informed the doctor recommended she have a cpap titration for mild hypoxemia. Pt is agreeable to to treatment.  Will precert titration

## 2020-12-16 ENCOUNTER — Ambulatory Visit (INDEPENDENT_AMBULATORY_CARE_PROVIDER_SITE_OTHER): Payer: 59 | Admitting: Family Medicine

## 2020-12-16 ENCOUNTER — Other Ambulatory Visit: Payer: Self-pay

## 2020-12-16 ENCOUNTER — Encounter (INDEPENDENT_AMBULATORY_CARE_PROVIDER_SITE_OTHER): Payer: Self-pay | Admitting: Family Medicine

## 2020-12-16 VITALS — BP 143/81 | HR 54 | Temp 98.0°F | Ht 64.0 in | Wt 198.0 lb

## 2020-12-16 DIAGNOSIS — Z6838 Body mass index (BMI) 38.0-38.9, adult: Secondary | ICD-10-CM

## 2020-12-16 DIAGNOSIS — I10 Essential (primary) hypertension: Secondary | ICD-10-CM

## 2020-12-16 DIAGNOSIS — M25561 Pain in right knee: Secondary | ICD-10-CM | POA: Diagnosis not present

## 2020-12-16 DIAGNOSIS — E559 Vitamin D deficiency, unspecified: Secondary | ICD-10-CM | POA: Diagnosis not present

## 2020-12-16 DIAGNOSIS — Z9189 Other specified personal risk factors, not elsewhere classified: Secondary | ICD-10-CM

## 2020-12-16 MED ORDER — VITAMIN D (ERGOCALCIFEROL) 1.25 MG (50000 UNIT) PO CAPS: 50000.0000 [IU] | ORAL_CAPSULE | ORAL | 0 refills | Status: DC

## 2020-12-16 NOTE — Progress Notes (Signed)
Chief Complaint:   OBESITY [name] is here to discuss her progress with her obesity treatment plan along with follow-up of her obesity related diagnoses. [name] is on the Category 2 Plan and states [name] is following her eating plan approximately 90-95% of the time. [name] states [name] is doing cardio for 15-20 minutes 3-4 times per week.  Today's visit was #: 10 Starting weight: 228 lbs Starting date: 07/20/2020 Today's weight: 198 lbs Today's date: 12/16/2020 Total lbs lost to date: 30 Total lbs lost since last in-office visit: 2  Interim History: [name] continues to do well with weight loss on her plan. Her hunger is controlled and [name] is doing well with meal planning. Exercise is difficult due to right knee pain which was diagnosed as a Balers cyst but no improvement yet.  Subjective:   1. Essential hypertension Kimanh's blood pressure is elevated today, which ay be due to pain. [name] continues to work on diet and weight loss.  2. Vitamin D deficiency [name] is on Vit D, and [name] requests a refill today. Her Vit D level is not yet at goal.  3. Right knee pain, unspecified chronicity [name] has right knee pain around her knee both lateral and medical to the patella as well as behind her knee. [name] was diagnosed with a balers cyst but no pain improvement yet. This affecting her exercise.  4. At risk for heart disease [name] is at a higher than average risk for cardiovascular disease due to obesity.   Assessment/Plan:   1. Essential hypertension [name] will continue her medications and will work on improving knee pain. [name] will watch for signs of hypotension as [name] continues her lifestyle modifications.  2. Vitamin D deficiency Low Vitamin D level contributes to fatigue and are associated with obesity, breast, and colon cancer. We will refill prescription Vitamin D for 1 month. We will recheck labs in 1-2 months. [name] will follow-up for routine testing of Vitamin D, at least 2-3  times per year to avoid over-replacement.  - Vitamin D, Ergocalciferol, (DRISDOL) 1.25 MG (50000 UNIT) CAPS capsule; Take 1 capsule (50,000 Units total) by mouth every 7 (seven) days.  Dispense: 4 capsule; Refill: 0  3. Right knee pain, unspecified chronicity We will refer [name] to see [name] for evaluation.   - AMB referral to sports medicine  4. At risk for heart disease [name] was given approximately 15 minutes of coronary artery disease prevention counseling today. [name] is 60 y.o. female and has risk factors for heart disease including obesity. We discussed intensive lifestyle modifications today with an emphasis on specific weight loss instructions and strategies.   Repetitive spaced learning was employed today to elicit superior memory formation and behavioral change.  5. Obesity with current BMI 34 [name] is currently in the action stage of change. As such, her goal is to continue with weight loss efforts. [name] has agreed to the Category 2 Plan.   Exercise goals: exercise as tolerated.  Behavioral modification strategies: increasing lean protein intake and dealing with family or coworker sabotage.  [name] has agreed to follow-up with our clinic in 2 to 3 weeks. [name] was informed of the importance of frequent follow-up visits to maximize her success with intensive lifestyle modifications for her multiple health conditions.   Objective:   Blood pressure (!) 143/81, pulse (!) 54, temperature 98 F (36.7 C), height 5\' 4"  (1.626 m), weight 198 lb (89.8 kg), SpO2 97 %. Body mass index is 33.99 kg/m.  General: Cooperative, alert, well developed, in no acute distress. HEENT: Conjunctivae and lids unremarkable. Cardiovascular: Regular rhythm.  Lungs: Normal work of breathing. Neurologic: No focal deficits.   Lab Results  Component Value Date   CREATININE 0.88 11/02/2020   BUN 17 11/02/2020   NA 138 11/02/2020   K 4.2 11/02/2020   CL 99 11/02/2020   CO2 26 11/02/2020    Lab Results  Component Value Date   ALT 46 (H) 11/02/2020   AST 25 11/02/2020   ALKPHOS 59 11/02/2020   BILITOT 0.7 11/02/2020   Lab Results  Component Value Date   HGBA1C 6.0 (H) 07/20/2020   Lab Results  Component Value Date   INSULIN 17.8 07/20/2020   Lab Results  Component Value Date   TSH 1.680 11/02/2020   Lab Results  Component Value Date   CHOL 194 07/20/2020   HDL 55 07/20/2020   LDLCALC 123 (H) 07/20/2020   TRIG 87 07/20/2020   CHOLHDL 3.3 12/23/2018   Lab Results  Component Value Date   VD25OH 33.6 07/20/2020   Lab Results  Component Value Date   WBC 7.8 07/20/2020   HGB 13.3 07/20/2020   HCT 39.7 07/20/2020   MCV 91 07/20/2020   PLT 497 (H) 04/18/2019   Lab Results  Component Value Date   FERRITIN 1,561 (H) 04/13/2019   Attestation Statements:   Reviewed by clinician on day of visit: allergies, medications, problem list, medical history, surgical history, family history, social history, and previous encounter notes.   I, Burt Knack, am acting as transcriptionist for Quillian Quince, MD.  I have reviewed the above documentation for accuracy and completeness, and I agree with the above. -  Quillian Quince, MD

## 2020-12-19 ENCOUNTER — Other Ambulatory Visit: Payer: Self-pay

## 2020-12-19 ENCOUNTER — Ambulatory Visit: Payer: Self-pay

## 2020-12-19 ENCOUNTER — Ambulatory Visit (INDEPENDENT_AMBULATORY_CARE_PROVIDER_SITE_OTHER): Payer: 59 | Admitting: Family Medicine

## 2020-12-19 ENCOUNTER — Ambulatory Visit (INDEPENDENT_AMBULATORY_CARE_PROVIDER_SITE_OTHER): Payer: 59

## 2020-12-19 VITALS — BP 138/84 | HR 50 | Wt 201.8 lb

## 2020-12-19 DIAGNOSIS — M7121 Synovial cyst of popliteal space [Baker], right knee: Secondary | ICD-10-CM

## 2020-12-19 DIAGNOSIS — M25561 Pain in right knee: Secondary | ICD-10-CM

## 2020-12-19 NOTE — Progress Notes (Signed)
I, Philbert Riser, LAT, ATC acting as a scribe for Clementeen Graham, MD.  Subjective:    CC: R knee pain  HPI: Pt is a 60 y/o female c/o R knee pain x 2 months. Of note, pt was seen at the [facility] ED on 11/25/20 c/o R leg pain and swelling and a R LE venous dopper [place] was performed and a DVT was r/u and she was dx w/ a Baker's cyst. Today, pt locates pain to the posterior aspect of knee and w/ radiating pain in the r calf.  She notes mechanical symptoms in her knee.  When she stands on her knee and rotated she feels a clicking and unstable sensation.  R knee swelling: yes- and LE Aggravates: walking, squatting, knee flex Treatments tried:Tylenol, cream  Dx testing: 11/25/20 R LE venous dopper [place]  Pertinent review of Systems: No fevers or chills  Relevant historical information: Hypertension.     Objective:    Vitals:   12/19/20 1429  BP: 138/84  Pulse: (!) 50  SpO2: 96%   General: Well Developed, well nourished, and in no acute distress.   MSK: Right knee moderate effusion. Range of motion limited unable to fully extend the knee and fully flex the knee.  Range of motion 5-110 degrees. Intact strength. Tender to palpation medial joint line.  Mildly tender palpation posterior knee. Intact strength.  Some pain with resisted flexion Positive McMurray's test. Stable ligamentous exam.  Lab and Radiology Results  Procedure: Real-time Ultrasound Guided Injection of right knee superior lateral patellar space Device: Philips Affiniti 50G Images permanently stored and available for review in PACS Ultrasound evaluation prior to injection reveals a large joint effusion.  Intact quad and patellar tendon. Narrowed medial and lateral joint line with degenerative appearing medial lateral menisci. Moderate Baker's cyst. Verbal informed consent obtained.  Discussed risks and benefits of procedure. Warned about infection bleeding damage to structures skin hypopigmentation and fat  atrophy among others. Patient expresses understanding and agreement Time-out conducted.   Noted no overlying erythema, induration, or other signs of local infection.   Skin prepped in a sterile fashion.   Local anesthesia: Topical Ethyl chloride.   With sterile technique and under real time ultrasound guidance: 40 mg of Kenalog and 2 mL of lidocaine injected into knee joint. Fluid seen entering the joint capsule.   Completed without difficulty   Pain immediately resolved suggesting accurate placement of the medication.   Advised to call if fevers/chills, erythema, induration, drainage, or persistent bleeding.   Images permanently stored and available for review in the ultrasound unit.  Impression: Technically successful ultrasound guided injection.    X-ray images right knee obtained today personally and independently interpreted Minimal medial compartment DJD.  No acute fractures. Await formal radiology review    Impression and Recommendations:    Assessment and Plan: 59 y.o. female with right knee pain thought predominantly to be due to medial degenerative meniscus tear with possible lateral degenerative meniscus tear.  She does have a moderate to large joint effusion and ice moderate to small Baker's cyst that is also contributory.  I think the Baker's cyst did drain some into her calf which is why she developed the calf pain.  The posterior aspect of her knee pain is much less than the rest of her knee pain therefore will defer the Baker's cyst for now.  Plan for intra-articular steroid injection, referral to PT, compression sleeve, and Voltaren gel.  Recheck in 6 weeks.Marland Kitchen  PDMP not  reviewed this encounter. Orders Placed This Encounter  Procedures   DG Knee AP/LAT W/Sunrise Right    Standing Status:   Future    Number of Occurrences:   1    Standing Expiration Date:   12/19/2021    Order Specific Question:   Reason for Exam (SYMPTOM  OR DIAGNOSIS REQUIRED)    Answer:   right  knee pain    Order Specific Question:   Preferred imaging location?    Answer:   [facility]   [place] LIMITED JOINT SPACE STRUCTURES LOW RIGHT(NO LINKED CHARGES)    Standing Status:   Future    Number of Occurrences:   1    Standing Expiration Date:   06/19/2021    Order Specific Question:   Reason for Exam (SYMPTOM  OR DIAGNOSIS REQUIRED)    Answer:   right knee pain    Order Specific Question:   Preferred imaging location?    Answer:   [facility] referral to Physical Therapy    Referral Priority:   Routine    Referral Type:   Physical Medicine    Referral Reason:   Specialty Services Required    Requested Specialty:   Physical Therapy   No orders of the defined types were placed in this encounter.   Discussed warning signs or symptoms. Please see discharge instructions. Patient expresses understanding.   The above documentation has been reviewed and is accurate and complete Clementeen Graham, M.D.

## 2020-12-19 NOTE — Patient Instructions (Addendum)
Thank you for coming in today.   Please get an Xray today before you leave   Please use Voltaren gel (Generic Diclofenac Gel) up to 4x daily for pain as needed.  This is available over-the-counter as both the name brand Voltaren gel and the generic diclofenac gel.   I've referred you to Physical Therapy.  Let us know if you don't hear from them in one week.   Call or go to the ER if you develop a large red swollen joint with extreme pain or oozing puss.    I recommend you obtained a compression sleeve to help with your joint problems. There are many options on the market however I recommend obtaining a full knee Body Helix compression sleeve.  You can find information (including how to appropriate measure yourself for sizing) can be found at www.[employer].ch.  Many of these products are health savings account (HSA) eligible.   You can use the compression sleeve at any time throughout the day but is most important to use while being active as well as for 2 hours post-activity.   It is appropriate to ice following activity with the compression sleeve in place.   Return in about 6 weeks.

## 2020-12-20 NOTE — Progress Notes (Signed)
Right knee x-ray shows some joint swelling but otherwise looks okay to radiology without significant arthritis seen on x-ray.

## 2020-12-28 ENCOUNTER — Other Ambulatory Visit: Payer: Self-pay

## 2020-12-28 ENCOUNTER — Ambulatory Visit: Payer: 59 | Attending: Family Medicine | Admitting: Physical Therapy

## 2020-12-28 DIAGNOSIS — R600 Localized edema: Secondary | ICD-10-CM | POA: Insufficient documentation

## 2020-12-28 DIAGNOSIS — R262 Difficulty in walking, not elsewhere classified: Secondary | ICD-10-CM | POA: Insufficient documentation

## 2020-12-28 DIAGNOSIS — M25562 Pain in left knee: Secondary | ICD-10-CM | POA: Insufficient documentation

## 2020-12-28 DIAGNOSIS — G8929 Other chronic pain: Secondary | ICD-10-CM | POA: Diagnosis present

## 2020-12-28 NOTE — Patient Instructions (Signed)
Access Code: [email or web address] URL: [email or web address] Date: 12/28/2020 Prepared by: Karie Mainland  Exercises Supine Quad Set - 2 x daily - 7 x weekly - 2 sets - 10 reps - 10 hold Supine Active Straight Leg Raise - 2 x daily - 7 x weekly - 2 sets - 10 reps - 10 hold Seated Hamstring Stretch - 2 x daily - 7 x weekly - 1 sets - 3 reps - 30 hold Standing Gastroc Stretch - 2 x daily - 7 x weekly - 1 sets - 3 reps - 30 hold

## 2020-12-28 NOTE — Therapy (Signed)
[facility] [address] [place], [place], [address] Phone: [phone]   Fax:  [phone]  Physical Therapy Evaluation  Patient Details  Name: [name] MRN: [id number] Date of Birth: [birth year] Referring Provider (PT): Dr. Clementeen Graham   Encounter Date: 12/28/2020   PT End of Session - 12/28/20 1327     Visit Number 1    Number of Visits 12    Date for PT Re-Evaluation 02/08/21    Authorization Type [employer]    PT Start Time 1107    PT Stop Time 1151    PT Time Calculation (min) 44 min    Activity Tolerance Patient tolerated treatment well    Behavior During Therapy [facility] for tasks assessed/performed             Past Medical History:  Diagnosis Date   Back pain    Baker's cyst of knee, right    Constipation    Heart burn    Hyperlipidemia    Hypertension    IBS (irritable bowel syndrome)    Joint pain    Lactose intolerance    Muscle pain    Other fatigue    Palpitations    Pre-diabetes    PVC (premature ventricular contraction)    Ringing in ear    Sleep apnea    SOB (shortness of breath) on exertion    Vitamin D deficiency     Past Surgical History:  Procedure Laterality Date   APPENDECTOMY     carpal tunn     CESAREAN SECTION     NEUROMA SURGERY     SEPTOPLASTY     TONSILLECTOMY     TOTAL ABDOMINAL HYSTERECTOMY W/ BILATERAL SALPINGOOPHORECTOMY     TUBAL LIGATION      There were no vitals filed for this visit.    Subjective Assessment - 12/28/20 1110     Subjective Pt reports pain with walking in Rt knee since July 2022.  About month ago she went to ED, ruled out a DVT. She had injection about 2 weeks ago it did help her with respect to her pain and swelling. She has difficulty walking long distances and especially after sitting her knee gets very stiff.  It hurts to fully extend her knee.  She does not do any formal stretching routine.  She is involved in the [facility] weight loss program and does not want to lose momentum with her weight loss.    Limitations Sitting;Lifting;Standing;House hold activities    How long can you stand comfortably? shifts alot    How long can you walk comfortably? 20-25 min , then does 15 min cardio ex    Diagnostic tests US showed Joint effusion, some medial and lateral joint degenerative changes    Patient Stated Goals be able to cont walking for wgt loss    Currently in Pain? Yes    Pain Score 2     Pain Location Knee    Pain Orientation Right;Posterior    Pain Descriptors / Indicators Dull;Aching    Pain Type Chronic pain    Pain Radiating Towards mid calf , better since injection    Pain Onset More than a month ago    Pain Frequency Intermittent    Aggravating Factors  bending, squat, sit long time to stand    Pain Relieving Factors rest, elevation, injection    Effect of Pain on Daily Activities limits comfort  [facility] PT Assessment - 12/28/20 0001       Assessment   Medical Diagnosis Rt knee pain, posterior    Referring Provider (PT) Dr. 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 , closed 2 yrs ago    Leisure likes to exercise, grandkids      Cognition   Overall Cognitive Status Within Functional Limits for tasks assessed      Observation/Other Assessments   Focus on Therapeutic Outcomes (FOTO)  61      Circumferential Edema   Circumferential - Right 42.5 cm    Circumferential - Left  41.5 cm      Sensation   Light Touch Appears Intact      Functional  Tests   Functional tests Squat      Squat   Comments quad dominant, painful      Single Leg Stance   Comments about 10-15 sec Rt and Lt LE, increased effort on Rt LE      Sit to Stand   Comments increased valgus      Posture/Postural Control   Posture Comments genu valgus, mild      PROM   Overall PROM Comments pain Rt knee full extension      Strength   Right Hip Flexion 4+/5    Right Hip Extension 4/5    Right Hip ABduction 4/5    Left Hip Flexion 4+/5    Left Hip Extension 4+/5    Left Hip ABduction 4+/5    Right Knee Flexion 4+/5    Right Knee Extension 4+/5    Left Knee Flexion 5/5    Left Knee Extension 5/5      Flexibility   Hamstrings Rt knee 60 deg, Lt knee 50 deg ,      Palpation   Palpation comment sore along Rt medial joint line and posteriorly      Transfers   Five time sit to stand comments  16. 5              Objective measurements completed on examination: See above findings.      PT Education - 12/28/20 1327     Education Details PT/POC, aquatics? HEP, ice post exercise, stretch post walking    Person(s) Educated Patient    Methods Explanation;Handout    Comprehension Verbalized understanding;Returned demonstration                 PT Long Term Goals - 12/28/20 1328       PT LONG TERM GOAL #1   Title Pt will be I with HEP for Rt knee strength, ROM and stability    Time 6    Period Weeks    Status New    Target Date 02/08/21      PT LONG TERM GOAL #2   Title Pt will be able to perform body weight squats with good form and no increased knee pain  Time 6    Period Weeks    Status New    Target Date 02/08/21      PT LONG TERM GOAL #3   Title Pt will be able to stand on Rt LE for balance/stability exercises with no increase in R post knee pain    Time 6    Period Weeks    Status New    Target Date 02/08/21      PT LONG TERM GOAL #4   Title Pt will be able to report no limitation of pain with transfers and walking  for fitness.    Time 6    Period Weeks    Status New    Target Date 02/08/21      PT LONG TERM GOAL #5   Title Pt will improve FOTO score to 71% or more    Time 6    Period Weeks    Status New    Target Date 02/08/21                    Plan - 12/28/20 1607     Clinical Impression Statement Patient presents for low complexity eval of Rt sided knee pain which has improved since her recent injection. She has difficulty extending her completely on the mat or in standing due to pain .  Passive ext is 0 deg.  She is restricted in calf as well.  She has very mild weakness in hips, core.  She has slight swelling noted in Rt knee.  She will do well with PT intervention to get her back to pain free exercise to aid in weight loss and overall health.    Personal Factors and Comorbidities Comorbidity 1    Comorbidities knee OA    Examination-Activity Limitations Stairs;Stand;Sit;Transfers;Sleep;Lift;Locomotion Level    Examination-Participation Restrictions Community Activity;Interpersonal Relationship;Shop;Other   fitness   Stability/Clinical Decision Making Stable/Uncomplicated    Clinical Decision Making Low    Rehab Potential Excellent    PT Frequency 2x / week    PT Duration 6 weeks    PT Treatment/Interventions ADLs/Self Care Home Management;Cryotherapy;Electrical Stimulation;Moist Heat;Therapeutic exercise;Manual techniques;Balance training;Taping;Functional mobility training;Passive range of motion;Therapeutic activities;Patient/family education;Aquatic Therapy    PT Next Visit Plan check HEP,closed chain as able , bike , stretch calf and hamstring. Aquatics info    PT Home Exercise Plan Access Code: [email or web address]    Consulted and Agree with Plan of Care Patient             Patient will benefit from skilled therapeutic intervention in order to improve the following deficits and impairments:  Abnormal gait, Decreased mobility, Difficulty walking, Pain, Postural dysfunction,  Impaired flexibility, Increased fascial restricitons, Decreased strength, Decreased range of motion, Increased edema, Obesity  Visit Diagnosis: Chronic pain of left knee  Localized edema  Difficulty in walking, not elsewhere classified     Problem List Patient Active Problem List   Diagnosis Date Noted   Baker's cyst of knee, right 11/30/2020   Pneumonia due to COVID-19 virus 04/13/2019   Hypokalemia 04/13/2019   Primary osteoarthritis of first carpometacarpal joint of left hand 05/03/2015   Radial styloid tenosynovitis 05/03/2015   PVC (premature ventricular contraction) 02/05/2014   Benign essential HTN 02/05/2014   Dyslipidemia 02/05/2014    Judyth Demarais, PT 12/28/2020, 4:17 PM  [facility] [address] [place], [place], [address] Phone: [phone]   Fax:  [phone]  Name: [name] MRN: [id number] Date of Birth: [birth year]  Victorino Dike  Simon Rhein, PT 12/28/20 4:17 PM Phone: [phone] Fax: [phone]

## 2021-01-05 ENCOUNTER — Encounter (INDEPENDENT_AMBULATORY_CARE_PROVIDER_SITE_OTHER): Payer: Self-pay | Admitting: Family Medicine

## 2021-01-05 ENCOUNTER — Other Ambulatory Visit: Payer: Self-pay

## 2021-01-05 ENCOUNTER — Telehealth: Payer: Self-pay | Admitting: *Deleted

## 2021-01-05 ENCOUNTER — Ambulatory Visit (INDEPENDENT_AMBULATORY_CARE_PROVIDER_SITE_OTHER): Payer: 59 | Admitting: Family Medicine

## 2021-01-05 VITALS — BP 137/72 | HR 61 | Temp 98.3°F | Ht 64.0 in | Wt 194.0 lb

## 2021-01-05 DIAGNOSIS — Z6838 Body mass index (BMI) 38.0-38.9, adult: Secondary | ICD-10-CM

## 2021-01-05 DIAGNOSIS — R7303 Prediabetes: Secondary | ICD-10-CM

## 2021-01-05 DIAGNOSIS — Z9189 Other specified personal risk factors, not elsewhere classified: Secondary | ICD-10-CM | POA: Diagnosis not present

## 2021-01-05 DIAGNOSIS — E559 Vitamin D deficiency, unspecified: Secondary | ICD-10-CM

## 2021-01-05 MED ORDER — VITAMIN D (ERGOCALCIFEROL) 1.25 MG (50000 UNIT) PO CAPS: 50000.0000 [IU] | ORAL_CAPSULE | ORAL | 0 refills | Status: DC

## 2021-01-05 NOTE — Telephone Encounter (Signed)
Prior Authorization for titration sent to [facility] via web portal.  authorization number for this request is [id number]. Submitted 01/05/21.

## 2021-01-05 NOTE — Progress Notes (Signed)
Chief Complaint:   OBESITY [name] is here to discuss her progress with her obesity treatment plan along with follow-up of her obesity related diagnoses. [name] is on the Category 2 Plan and states she is following her eating plan approximately 90-95% of the time. [name] states she is doing cardio and muscle training for 20-30 minutes 3 times per week.  Today's visit was #: 11 Starting weight: 228 lbs Starting date: 07/20/2020 Today's weight: 194 lbs Today's date: 01/05/2021 Total lbs lost to date: 34 Total lbs lost since last in-office visit: 4  Interim History: [name] continues to do well with weight loss. She is getting bored with her dinner options and she would like to have more choices. She has questions about red meat and the healthiest cuts of steak.  Subjective:   1. Vitamin D deficiency [name] is on Vit D, and she denies nausea, vomiting, or muscle weakness.  2. Pre-diabetes [name] is working on decreasing sugar in her diet as well as weight loss. She denies signs of hypoglycemia.  3. At risk for heart disease [name] is at a higher than average risk for cardiovascular disease due to obesity.   Assessment/Plan:   1. Vitamin D deficiency Low Vitamin D level contributes to fatigue and are associated with obesity, breast, and colon cancer. We will refill prescription Vitamin D for 1 month. [name] will follow-up for routine testing of Vitamin D, at least 2-3 times per year to avoid over-replacement.  - Vitamin D, Ergocalciferol, (DRISDOL) 1.25 MG (50000 UNIT) CAPS capsule; Take 1 capsule (50,000 Units total) by mouth every 7 (seven) days.  Dispense: 4 capsule; Refill: 0  2. Pre-diabetes [name] will continue with diet and exercise, and decreasing simple carbohydrates to help decrease the risk of diabetes. We will recheck labs in 1-2 months.  3. At risk for heart disease [name] was given approximately 15 minutes of coronary artery disease prevention counseling today. She  is 60 y.o. female and has risk factors for heart disease including obesity. We discussed intensive lifestyle modifications today with an emphasis on specific weight loss instructions and strategies.   Repetitive spaced learning was employed today to elicit superior memory formation and behavioral change.  4. Obesity with current BMI 33.3 [name] is currently in the action stage of change. As such, her goal is to continue with weight loss efforts. She has agreed to the Category 2 Plan and keeping a food journal and adhering to recommended goals of 400-550 calories and 35+ grams of protein at supper daily.   High vegetable and protein recipes were discussed as well as lower fat and cholesterol cuts of red meat.  Exercise goals: As is.  Behavioral modification strategies: increasing lean protein intake.  [name] has agreed to follow-up with our clinic in 3 weeks. She was informed of the importance of frequent follow-up visits to maximize her success with intensive lifestyle modifications for her multiple health conditions.   Objective:   Blood pressure 137/72, pulse 61, temperature 98.3 F (36.8 C), height 5\' 4"  (1.626 m), weight 194 lb (88 kg), SpO2 98 %. Body mass index is 33.3 kg/m.  General: Cooperative, alert, well developed, in no acute distress. HEENT: Conjunctivae and lids unremarkable. Cardiovascular: Regular rhythm.  Lungs: Normal work of breathing. Neurologic: No focal deficits.   Lab Results  Component Value Date   CREATININE 0.88 11/02/2020   BUN 17 11/02/2020   NA 138 11/02/2020   K 4.2 11/02/2020   CL 99 11/02/2020   CO2  26 11/02/2020   Lab Results  Component Value Date   ALT 46 (H) 11/02/2020   AST 25 11/02/2020   ALKPHOS 59 11/02/2020   BILITOT 0.7 11/02/2020   Lab Results  Component Value Date   HGBA1C 6.0 (H) 07/20/2020   Lab Results  Component Value Date   INSULIN 17.8 07/20/2020   Lab Results  Component Value Date   TSH 1.680 11/02/2020   Lab  Results  Component Value Date   CHOL 194 07/20/2020   HDL 55 07/20/2020   LDLCALC 123 (H) 07/20/2020   TRIG 87 07/20/2020   CHOLHDL 3.3 12/23/2018   Lab Results  Component Value Date   VD25OH 33.6 07/20/2020   Lab Results  Component Value Date   WBC 7.8 07/20/2020   HGB 13.3 07/20/2020   HCT 39.7 07/20/2020   MCV 91 07/20/2020   PLT 497 (H) 04/18/2019   Lab Results  Component Value Date   FERRITIN 1,561 (H) 04/13/2019   Attestation Statements:   Reviewed by clinician on day of visit: allergies, medications, problem list, medical history, surgical history, family history, social history, and previous encounter notes.   I, Burt Knack, am acting as transcriptionist for Quillian Quince, MD.  I have reviewed the above documentation for accuracy and completeness, and I agree with the above. -  Quillian Quince, MD

## 2021-01-06 NOTE — Telephone Encounter (Signed)
Prior Authorization for titration sent to [facility] via web portal. AUTH Number [id number]. APPROVED  11/1-12/31-22

## 2021-01-10 ENCOUNTER — Ambulatory Visit: Payer: 59 | Admitting: Physical Therapy

## 2021-01-10 ENCOUNTER — Other Ambulatory Visit: Payer: Self-pay

## 2021-01-10 DIAGNOSIS — R262 Difficulty in walking, not elsewhere classified: Secondary | ICD-10-CM

## 2021-01-10 DIAGNOSIS — M25562 Pain in left knee: Secondary | ICD-10-CM | POA: Diagnosis not present

## 2021-01-10 DIAGNOSIS — R600 Localized edema: Secondary | ICD-10-CM

## 2021-01-10 DIAGNOSIS — G8929 Other chronic pain: Secondary | ICD-10-CM

## 2021-01-10 NOTE — Therapy (Addendum)
[place], [place], [address] Phone: [phone]   Fax:  [phone]  Physical Therapy Treatment/Discharge  Patient Details  Name: [name] MRN: [id number] Date of Birth: 19-Jun-1960 Referring Provider (PT): Dr. Lynne Leader   Encounter Date: 01/10/2021   PT End of Session - 01/10/21 1235     Visit Number 2    Number of Visits 12    Date for PT Re-Evaluation 02/08/21    Authorization Type [employer]    PT Start Time 0277    PT Stop Time 1302    PT Time Calculation (min) 32 min             Past Medical History:  Diagnosis Date   Back pain    Baker's cyst of knee, right    Constipation    Heart burn    Hyperlipidemia    Hypertension    IBS (irritable bowel syndrome)    Joint pain    Lactose intolerance    Muscle pain    Other fatigue    Palpitations    Pre-diabetes    PVC (premature ventricular contraction)    Ringing in ear    Sleep apnea    SOB (shortness of breath) on exertion    Vitamin D deficiency     Past Surgical History:  Procedure Laterality Date   APPENDECTOMY     carpal tunn     CESAREAN SECTION     NEUROMA SURGERY     SEPTOPLASTY     TONSILLECTOMY     TOTAL ABDOMINAL HYSTERECTOMY W/ BILATERAL SALPINGOOPHORECTOMY     TUBAL LIGATION      There were no vitals filed for this visit.   Subjective Assessment - 01/10/21 1229     Subjective No pain at the moment. hurts more in the morning and if I sit and then get up. It does not hurt as much to straighten out my knee.    Currently in Pain? No/denies    Pain Score 0-No pain    Pain Orientation Right;Posterior    Pain Descriptors / Indicators Aching;Dull    Pain Type Chronic pain    Aggravating Factors  mornings, first getting up after sitting    Pain Relieving Factors rest, elevation, exercises                [facility] Adult PT Treatment/Exercise - 01/10/21 0001       Knee/Hip Exercises: Stretches    Active Hamstring Stretch 3 reps;30 seconds    Gastroc Stretch 2 reps;30 seconds    Soleus Stretch 2 reps;30 seconds      Knee/Hip Exercises: Aerobic   Recumbent Bike L2 x 5 min      Knee/Hip Exercises: Seated   Sit to [employer] 10 reps      Knee/Hip Exercises: Supine   Quad Sets 10 reps    Straight Leg Raises 10 reps    Straight Leg Raises Limitations 2 sets      Knee/Hip Exercises: Sidelying   Hip ABduction 10 reps    Hip ABduction Limitations 2 sets                          PT Long Term Goals - 01/10/21 1401       PT LONG TERM GOAL #1   Title Pt will be I with HEP for Rt knee strength, ROM and stability    Time 6    Period  Weeks    Status Achieved      PT LONG TERM GOAL #2   Title Pt will be able to perform body weight squats with good form and no increased knee pain    Time 6    Period Weeks    Status Not Met      PT LONG TERM GOAL #3   Title Pt will be able to stand on Rt LE for balance/stability exercises with no increase in R post knee pain    Period Weeks    Status Not Met      PT LONG TERM GOAL #4   Title Pt will be able to report no limitation of pain with transfers and walking for fitness.    Time 6    Period Weeks    Status Not Met      PT LONG TERM GOAL #5   Title Pt will improve FOTO score to 71% or more    Time 6    Period Weeks    Status Not Met                   Plan - 01/10/21 1240     Clinical Impression Statement Pt reports new medical issues. She would like to discontinue PT at this time in order to concentrate on other medical priorities that have recently come up. Reviewed HEP and progressed with soleus stretch, hip abduction, and sit-stands to address remaining deficits. Updated HEP. She will continue with current HEP and return to PT in the future if needed. She already notes improvement in her pain with attemptes to extend her knee since evaluation. She Met LTG#1 to establish and be independent with HEP. Her  remaining goals are left unmet however pt will contiue her her HEP on her own. Will discharge today.    PT Treatment/Interventions ADLs/Self Care Home Management;Cryotherapy;Electrical Stimulation;Moist Heat;Therapeutic exercise;Manual techniques;Balance training;Taping;Functional mobility training;Passive range of motion;Therapeutic activities;Patient/family education;Aquatic Therapy    PT Next Visit Plan discharged    PT Home Exercise Plan Access Code: [email or web address]             Patient will benefit from skilled therapeutic intervention in order to improve the following deficits and impairments:  Abnormal gait, Decreased mobility, Difficulty walking, Pain, Postural dysfunction, Impaired flexibility, Increased fascial restricitons, Decreased strength, Decreased range of motion, Increased edema, Obesity  Visit Diagnosis: Chronic pain of left knee  Localized edema  Difficulty in walking, not elsewhere classified     Problem List Patient Active Problem List   Diagnosis Date Noted   Baker's cyst of knee, right 11/30/2020   Pneumonia due to COVID-19 virus 04/13/2019   Hypokalemia 04/13/2019   Primary osteoarthritis of first carpometacarpal joint of left hand 05/03/2015   Radial styloid tenosynovitis 05/03/2015   PVC (premature ventricular contraction) 02/05/2014   Benign essential HTN 02/05/2014   Dyslipidemia 02/05/2014    [name], PTA 01/10/2021, 2:05 PM  [facility] [address] [place], [place], [address] Phone: [phone]   Fax:  [phone]  Name: [name] MRN: [id number] Date of Birth: [birth year]   PHYSICAL THERAPY DISCHARGE SUMMARY  Visits from Start of Care: 2  Current functional level related to goals / functional outcomes: See above    Remaining deficits: See note. Knee pain improved with HEP.    Education / Equipment: HEP    Patient agrees to discharge. Patient goals were  partially met. Patient is being discharged due to the patient's request.  Raeford Razor, PT 01/11/21 4:32 PM Phone: [phone] Fax: [phone]

## 2021-01-12 ENCOUNTER — Encounter: Payer: 59 | Admitting: Physical Therapy

## 2021-01-17 ENCOUNTER — Encounter: Payer: 59 | Admitting: Physical Therapy

## 2021-01-18 NOTE — Telephone Encounter (Signed)
Patient is scheduled for CPAP Titration on 02/28/21. Patient understands her titration study will be done at [facility] sleep lab. Patient understands she will receive a letter in a week or so detailing appointment, date, time, and location. Patient understands to call if she does not receive the letter  in a timely manner. Patient agrees with treatment and thanked me for call.

## 2021-01-19 ENCOUNTER — Encounter: Payer: 59 | Admitting: Physical Therapy

## 2021-01-24 ENCOUNTER — Encounter: Payer: 59 | Admitting: Physical Therapy

## 2021-01-26 ENCOUNTER — Ambulatory Visit (INDEPENDENT_AMBULATORY_CARE_PROVIDER_SITE_OTHER): Payer: 59 | Admitting: Family Medicine

## 2021-01-26 ENCOUNTER — Other Ambulatory Visit: Payer: Self-pay

## 2021-01-26 ENCOUNTER — Encounter (INDEPENDENT_AMBULATORY_CARE_PROVIDER_SITE_OTHER): Payer: Self-pay | Admitting: Family Medicine

## 2021-01-26 ENCOUNTER — Encounter: Payer: 59 | Admitting: Physical Therapy

## 2021-01-26 VITALS — BP 126/68 | HR 53 | Temp 98.1°F | Ht 64.0 in | Wt 191.0 lb

## 2021-01-26 DIAGNOSIS — E559 Vitamin D deficiency, unspecified: Secondary | ICD-10-CM | POA: Diagnosis not present

## 2021-01-26 DIAGNOSIS — R7303 Prediabetes: Secondary | ICD-10-CM

## 2021-01-26 DIAGNOSIS — Z9189 Other specified personal risk factors, not elsewhere classified: Secondary | ICD-10-CM | POA: Diagnosis not present

## 2021-01-26 DIAGNOSIS — Z6838 Body mass index (BMI) 38.0-38.9, adult: Secondary | ICD-10-CM | POA: Diagnosis not present

## 2021-01-26 MED ORDER — VITAMIN D (ERGOCALCIFEROL) 1.25 MG (50000 UNIT) PO CAPS: 50000.0000 [IU] | ORAL_CAPSULE | ORAL | 0 refills | Status: DC

## 2021-01-26 NOTE — Progress Notes (Signed)
I, Christoper Fabian, LAT, ATC, am serving as scribe for Dr. Clementeen Graham.  [name] is a 60 y.o. female who presents to [facility] at [facility] today for f/u of chronic R knee pain thought to be due to meniscal tears.  Of note, pt was seen at the [facility] ED on 11/25/20 c/o R leg pain and swelling and a R LE venous dopper [place] was performed and a DVT was r/u and she was dx w/ a Baker's cyst.  She was last seen by Dr. Denyse Amass on 12/19/20 and had a R knee steroid injection and was referred to PT of which she has completed 1 session.  She was also advised to use Voltaren gel and a knee compression sleeve.  Today, pt reports R knee is feeling somewhat better, but is still stiff and has a "pulling" "tightness" in the posterior aspect. Pt feels like the prior steroid injection really helped.  She has popping and clicking in her right knee associated with pain.  She denies complete locking.  She is willing to consider surgery.  Dx testing: 12/19/20 R knee XR  11/25/20 R LE vascular US  Pertinent review of systems: No fevers or chills  Relevant historical information: Hypertension.  Dyslipidemia.   Exam:  BP 128/86   Pulse (!) 53   Ht 5\' 4"  (1.626 m)   Wt 195 lb 3.2 oz (88.5 kg)   SpO2 98%   BMI 33.51 kg/m  General: Well Developed, well nourished, and in no acute distress.   MSK: Right knee normal-appearing normal motion with crepitation.  Tender palpation medial joint line. Positive medial McMurray's test. Stable ligamentous exam. Intact strength.    Lab and Radiology Results  EXAM: RIGHT KNEE 3 VIEWS   COMPARISON:  None   FINDINGS: Mild osseous demineralization.   Joint spaces preserved.   RIGHT knee joint effusion present.   No acute fracture, dislocation, or bone destruction.   IMPRESSION: No acute osseous abnormalities.   RIGHT knee joint effusion present.     Electronically Signed   By: M.D.   On: 12/20/2020 09:02 I,  02/19/2021, personally (independently) visualized and performed the interpretation of the images attached in this note.     Assessment and Plan: 61 y.o. female with right knee pain with mechanical symptoms thought to be due to medial meniscus tear.  Patient does have a Baker's cyst but this seems to be a lesser issue.  She did have some benefit from an intra-articular steroid injection and physician directed home exercise program but is still having significant mechanical symptoms.  She is interested in potentially willing to consider surgery.  Plan to proceed to MRI to further characterize cause of pain and for potential surgical planning.  Likely refer to orthopedic surgery after results of MRI are back.   PDMP not reviewed this encounter. Orders Placed This Encounter  Procedures   MR Knee Right Wo Contrast    Evaluate for R meniscal tear.    Standing Status:   Future    Standing Expiration Date:   01/30/2022    Order Specific Question:   What is the patient's sedation requirement?    Answer:   No Sedation    Order Specific Question:   Does the patient have a pacemaker or implanted devices?    Answer:   No    Order Specific Question:   Preferred imaging location?    Answer:   02/01/2022 (table limit-350lbs)  No orders of the defined types were placed in this encounter.    Discussed warning signs or symptoms. Please see discharge instructions. Patient expresses understanding.   The above documentation has been reviewed and is accurate and complete Clementeen Graham, M.D.  Total encounter time 20 minutes including face-to-face time with the patient and, reviewing past medical record, and charting on the date of service.   Discussed treatment plan and options

## 2021-01-26 NOTE — Progress Notes (Signed)
Chief Complaint:   OBESITY [name] is here to discuss her progress with her obesity treatment plan along with follow-up of her obesity related diagnoses. [name] is on the Category 2 Plan and keeping a food journal and adhering to recommended goals of 400-550 calories and 35+ grams of protein at supper daily and states she is following her eating plan approximately 90-95% of the time. [name] states she is doing 0 minutes 0 times per week.  Today's visit was #: 12 Starting weight: 228 lbs Starting date: 07/20/2020 Today's weight: 191 lbs Today's date: 01/26/2021 Total lbs lost to date: 37 Total lbs lost since last in-office visit: 3  Interim History: Alpha continues to do well with weight loss on her eating plan. Her hunger is controlled and she is doing well with diet and exercise.  Subjective:   1. Vitamin D deficiency [name]'s last Vit D level was below goal previously. She is on Vit D prescription currently, and she is due for labs soon.  2. Pre-diabetes [name] is stable on her diet and weight loss. She notes decreased polyphagia.  3. At risk for heart disease [name] is at a higher than average risk for cardiovascular disease due to obesity.   Assessment/Plan:   1. Vitamin D deficiency Low Vitamin D level contributes to fatigue and are associated with obesity, breast, and colon cancer. We will recheck labs in 1 month. We will refill prescription Vitamin D 50,000 IU every week #4 for 1 month. [name] will follow-up for routine testing of Vitamin D, at least 2-3 times per year to avoid over-replacement.  2. Pre-diabetes [name] will continue diet, exercise, and decreasing simple carbohydrates to help decrease the risk of diabetes. We will recheck labs in 1 month.  3. At risk for heart disease [name] was given approximately 15 minutes of coronary artery disease prevention counseling today. She is 60 y.o. female and has risk factors for heart disease including obesity. We  discussed intensive lifestyle modifications today with an emphasis on specific weight loss instructions and strategies.   Repetitive spaced learning was employed today to elicit superior memory formation and behavioral change.  4. Obesity with current BMI 32.9 [name] is currently in the action stage of change. As such, her goal is to continue with weight loss efforts. She has agreed to the Category 2 Plan.   We will recheck fasting labs at her next visit.  Behavioral modification strategies: meal planning and cooking strategies and holiday eating strategies .  [name] has agreed to follow-up with our clinic in 4 weeks. She was informed of the importance of frequent follow-up visits to maximize her success with intensive lifestyle modifications for her multiple health conditions.   Objective:   Blood pressure 126/68, pulse (!) 53, temperature 98.1 F (36.7 C), temperature source Oral, height 5\' 4"  (1.626 m), weight 191 lb (86.6 kg), SpO2 99 %. Body mass index is 32.79 kg/m.  General: Cooperative, alert, well developed, in no acute distress. HEENT: Conjunctivae and lids unremarkable. Cardiovascular: Regular rhythm.  Lungs: Normal work of breathing. Neurologic: No focal deficits.   Lab Results  Component Value Date   CREATININE 0.88 11/02/2020   BUN 17 11/02/2020   NA 138 11/02/2020   K 4.2 11/02/2020   CL 99 11/02/2020   CO2 26 11/02/2020   Lab Results  Component Value Date   ALT 46 (H) 11/02/2020   AST 25 11/02/2020   ALKPHOS 59 11/02/2020   BILITOT 0.7 11/02/2020   Lab Results  Component Value Date   HGBA1C 6.0 (H) 07/20/2020   Lab Results  Component Value Date   INSULIN 17.8 07/20/2020   Lab Results  Component Value Date   TSH 1.680 11/02/2020   Lab Results  Component Value Date   CHOL 194 07/20/2020   HDL 55 07/20/2020   LDLCALC 123 (H) 07/20/2020   TRIG 87 07/20/2020   CHOLHDL 3.3 12/23/2018   Lab Results  Component Value Date   VD25OH 33.6 07/20/2020    Lab Results  Component Value Date   WBC 7.8 07/20/2020   HGB 13.3 07/20/2020   HCT 39.7 07/20/2020   MCV 91 07/20/2020   PLT 497 (H) 04/18/2019   Lab Results  Component Value Date   FERRITIN 1,561 (H) 04/13/2019   Attestation Statements:   Reviewed by clinician on day of visit: allergies, medications, problem list, medical history, surgical history, family history, social history, and previous encounter notes.   I, Burt Knack, am acting as transcriptionist for Quillian Quince, MD.  I have reviewed the above documentation for accuracy and completeness, and I agree with the above. -  Quillian Quince, MD

## 2021-01-30 ENCOUNTER — Other Ambulatory Visit: Payer: Self-pay

## 2021-01-30 ENCOUNTER — Ambulatory Visit (INDEPENDENT_AMBULATORY_CARE_PROVIDER_SITE_OTHER): Payer: 59 | Admitting: Family Medicine

## 2021-01-30 VITALS — BP 128/86 | HR 53 | Ht 64.0 in | Wt 195.2 lb

## 2021-01-30 DIAGNOSIS — M25561 Pain in right knee: Secondary | ICD-10-CM | POA: Diagnosis not present

## 2021-01-30 NOTE — Patient Instructions (Addendum)
Thank you for coming in today.   Please call [facility] to schedule your MRI, [phone]   Recheck back as needed.

## 2021-01-31 ENCOUNTER — Encounter: Payer: 59 | Admitting: Physical Therapy

## 2021-02-02 ENCOUNTER — Encounter: Payer: 59 | Admitting: Physical Therapy

## 2021-02-04 ENCOUNTER — Other Ambulatory Visit: Payer: 59

## 2021-02-11 ENCOUNTER — Ambulatory Visit (INDEPENDENT_AMBULATORY_CARE_PROVIDER_SITE_OTHER): Payer: 59

## 2021-02-11 ENCOUNTER — Other Ambulatory Visit: Payer: Self-pay

## 2021-02-11 DIAGNOSIS — M25561 Pain in right knee: Secondary | ICD-10-CM

## 2021-02-14 ENCOUNTER — Telehealth: Payer: Self-pay | Admitting: Family Medicine

## 2021-02-14 DIAGNOSIS — M25561 Pain in right knee: Secondary | ICD-10-CM

## 2021-02-14 DIAGNOSIS — S83281A Other tear of lateral meniscus, current injury, right knee, initial encounter: Secondary | ICD-10-CM

## 2021-02-14 NOTE — Progress Notes (Signed)
MRI does show meniscus tear as well as areas of cartilage loss in the knee.  Based on your mechanical symptoms I have referred you to orthopedic surgery.  You should hear soon about an appointment.  Please let me know if you do not hear anything.

## 2021-02-14 NOTE — Telephone Encounter (Signed)
Referral to orthopedic surgery

## 2021-02-16 ENCOUNTER — Other Ambulatory Visit: Payer: Self-pay

## 2021-02-16 ENCOUNTER — Encounter (INDEPENDENT_AMBULATORY_CARE_PROVIDER_SITE_OTHER): Payer: Self-pay | Admitting: Family Medicine

## 2021-02-16 ENCOUNTER — Ambulatory Visit (INDEPENDENT_AMBULATORY_CARE_PROVIDER_SITE_OTHER): Payer: 59 | Admitting: Family Medicine

## 2021-02-16 VITALS — BP 148/80 | HR 59 | Temp 98.1°F | Ht 64.0 in | Wt 187.0 lb

## 2021-02-16 DIAGNOSIS — Z6838 Body mass index (BMI) 38.0-38.9, adult: Secondary | ICD-10-CM

## 2021-02-16 DIAGNOSIS — E7849 Other hyperlipidemia: Secondary | ICD-10-CM

## 2021-02-16 DIAGNOSIS — E559 Vitamin D deficiency, unspecified: Secondary | ICD-10-CM | POA: Diagnosis not present

## 2021-02-16 DIAGNOSIS — I10 Essential (primary) hypertension: Secondary | ICD-10-CM

## 2021-02-16 DIAGNOSIS — R7303 Prediabetes: Secondary | ICD-10-CM

## 2021-02-16 MED ORDER — VITAMIN D (ERGOCALCIFEROL) 1.25 MG (50000 UNIT) PO CAPS: 50000.0000 [IU] | ORAL_CAPSULE | ORAL | 0 refills | Status: DC

## 2021-02-16 NOTE — Progress Notes (Signed)
Chief Complaint:   OBESITY [name] is here to discuss her progress with her obesity treatment plan along with follow-up of her obesity related diagnoses. [name] is on the Category 2 Plan and states she is following her eating plan approximately 70-75% of the time. [name] states she is walking for 30 minutes 7 times per week.  Today's visit was #: 13 Starting weight: 228 lbs Starting date: 07/20/2020 Today's weight: 187 lbs Today's date: 02/16/2021 Total lbs lost to date: 41 Total lbs lost since last in-office visit: 4  Interim History: [name] has done well with weight loss in the last few weeks. She has had a lot of stress with 2 close family members who had strokes a week apart.  Subjective:   1. Pre-diabetes [name] is working on diet and exercise, and she is due for labs.  2. Other hyperlipidemia [name] is on statin, and she is working on diet and exercise.  3. Essential hypertension [name]'s blood pressure is elevated. She has increased stress right now. She is on hydrochlorothiazide and metoprolol.   4. Vitamin D deficiency [name] is on Vit D, and she is due for labs.  Assessment/Plan:   1. Pre-diabetes [name] will continue to work on weight loss, exercise, and decreasing simple carbohydrates to help decrease the risk of diabetes. We will check labs today.  - CMP14+EGFR - Insulin, random - Hemoglobin A1c  2. Other hyperlipidemia Cardiovascular risk and specific lipid/LDL goals reviewed. We discussed several lifestyle modifications today. We will check labs today. [name] will continue to work on diet, exercise and weight loss efforts. Orders and follow up as documented in patient record.   - Lipid Panel With LDL/HDL Ratio  3. Essential hypertension [name] will continue diet and exercise to improve blood pressure control. We will recheck her blood pressure in 3 weeks.   4. Vitamin D deficiency Low Vitamin D level contributes to fatigue and are associated with  obesity, breast, and colon cancer. We will check labs today, and we will refill prescription Vitamin D for 1 month. [name] will follow-up for routine testing of Vitamin D, at least 2-3 times per year to avoid over-replacement.  - VITAMIN D 25 Hydroxy (Vit-D Deficiency, Fractures) - Vitamin D, Ergocalciferol, (DRISDOL) 1.25 MG (50000 UNIT) CAPS capsule; Take 1 capsule (50,000 Units total) by mouth every 7 (seven) days.  Dispense: 4 capsule; Refill: 0  5. Obesity with current BMI 32.2 [name] is currently in the action stage of change. As such, her goal is to continue with weight loss efforts. She has agreed to the Category 2 Plan.   Exercise goals: As is.  Behavioral modification strategies: increasing lean protein intake and holiday eating strategies .  [name] has agreed to follow-up with our clinic in 3 weeks. She was informed of the importance of frequent follow-up visits to maximize her success with intensive lifestyle modifications for her multiple health conditions.   [name] was informed we would discuss her lab results at her next visit unless there is a critical issue that needs to be addressed sooner. [name] agreed to keep her next visit at the agreed upon time to discuss these results.  Objective:   Blood pressure (!) 148/80, pulse (!) 59, temperature 98.1 F (36.7 C), height _0  (1.626 m), weight 187 lb (84.8 kg), SpO2 99 %. Body mass index is 32.1 kg/m.  General: Cooperative, alert, well developed, in no acute distress. HEENT: Conjunctivae and lids unremarkable. Cardiovascular: Regular rhythm.  Lungs: Normal work of breathing.  Neurologic: No focal deficits.   Lab Results  Component Value Date   CREATININE 0.88 11/02/2020   BUN 17 11/02/2020   NA 138 11/02/2020   K 4.2 11/02/2020   CL 99 11/02/2020   CO2 26 11/02/2020   Lab Results  Component Value Date   ALT 46 (H) 11/02/2020   AST 25 11/02/2020   ALKPHOS 59 11/02/2020   BILITOT 0.7 11/02/2020   Lab Results   Component Value Date   HGBA1C 6.0 (H) 07/20/2020   Lab Results  Component Value Date   INSULIN 17.8 07/20/2020   Lab Results  Component Value Date   TSH 1.680 11/02/2020   Lab Results  Component Value Date   CHOL 194 07/20/2020   HDL 55 07/20/2020   LDLCALC 123 (H) 07/20/2020   TRIG 87 07/20/2020   CHOLHDL 3.3 12/23/2018   Lab Results  Component Value Date   VD25OH 33.6 07/20/2020   Lab Results  Component Value Date   WBC 7.8 07/20/2020   HGB 13.3 07/20/2020   HCT 39.7 07/20/2020   MCV 91 07/20/2020   PLT 497 (H) 04/18/2019   Lab Results  Component Value Date   FERRITIN 1,561 (H) 04/13/2019   Attestation Statements:   Reviewed by clinician on day of visit: allergies, medications, problem list, medical history, surgical history, family history, social history, and previous encounter notes.   I, Trixie Dredge, am acting as transcriptionist for Dennard Nip, MD.  I have reviewed the above documentation for accuracy and completeness, and I agree with the above. -  Dennard Nip, MD

## 2021-02-17 LAB — CMP14+EGFR
ALT: 24 IU/L (ref 0–32)
AST: 15 IU/L (ref 0–40)
Albumin/Globulin Ratio: 1.8 (ref 1.2–2.2)
Albumin: 4.4 g/dL (ref 3.8–4.8)
Alkaline Phosphatase: 70 IU/L (ref 44–121)
BUN/Creatinine Ratio: 16 (ref 12–28)
BUN: 13 mg/dL (ref 8–27)
Bilirubin Total: 1.3 mg/dL — ABNORMAL HIGH (ref 0.0–1.2)
CO2: 26 mmol/L (ref 20–29)
Calcium: 9.5 mg/dL (ref 8.7–10.3)
Chloride: 103 mmol/L (ref 96–106)
Creatinine, Ser: 0.8 mg/dL (ref 0.57–1.00)
Globulin, Total: 2.4 g/dL (ref 1.5–4.5)
Glucose: 110 mg/dL — ABNORMAL HIGH (ref 70–99)
Potassium: 4.2 mmol/L (ref 3.5–5.2)
Sodium: 145 mmol/L — ABNORMAL HIGH (ref 134–144)
Total Protein: 6.8 g/dL (ref 6.0–8.5)
eGFR: 84 mL/min/{1.73_m2} (ref >59)

## 2021-02-17 LAB — LIPID PANEL WITH LDL/HDL RATIO
Cholesterol, Total: 144 mg/dL (ref 100–199)
HDL: 43 mg/dL (ref >39)
LDL Chol Calc (NIH): 89 mg/dL (ref 0–99)
LDL/HDL Ratio: 2.1 ratio (ref 0.0–3.2)
Triglycerides: 58 mg/dL (ref 0–149)
VLDL Cholesterol Cal: 12 mg/dL (ref 5–40)

## 2021-02-17 LAB — VITAMIN D 25 HYDROXY (VIT D DEFICIENCY, FRACTURES): Vit D, 25-Hydroxy: 63.7 ng/mL (ref 30.0–100.0)

## 2021-02-17 LAB — HEMOGLOBIN A1C
Est. average glucose Bld gHb Est-mCnc: 114 mg/dL
Hgb A1c MFr Bld: 5.6 % (ref 4.8–5.6)

## 2021-02-17 LAB — INSULIN, RANDOM: INSULIN: 5.9 u[IU]/mL (ref 2.6–24.9)

## 2021-02-21 ENCOUNTER — Ambulatory Visit (INDEPENDENT_AMBULATORY_CARE_PROVIDER_SITE_OTHER): Payer: 59 | Admitting: Orthopaedic Surgery

## 2021-02-21 ENCOUNTER — Other Ambulatory Visit: Payer: Self-pay

## 2021-02-21 ENCOUNTER — Encounter: Payer: Self-pay | Admitting: Orthopaedic Surgery

## 2021-02-21 VITALS — Ht 65.0 in | Wt 193.0 lb

## 2021-02-21 DIAGNOSIS — S83281A Other tear of lateral meniscus, current injury, right knee, initial encounter: Secondary | ICD-10-CM | POA: Diagnosis not present

## 2021-02-21 NOTE — Progress Notes (Signed)
Office Visit Note   Patient: [name]           Date of Birth: 1960-04-14           MRN: [id number] Visit Date: 02/21/2021              Requested by: Rodolph Bong, MD [address] [place],  [place] [address] PCP: Shirlean Mylar, MD (Inactive)   Assessment & Plan: Visit Diagnoses:  1. Acute lateral meniscus tear of right knee, initial encounter     Plan: Impression is symptomatic right lateral meniscus tear without relief from 6 months of conservative management.  At this point due to ongoing symptoms I have recommended arthroscopic partial lateral meniscectomy.  Risk benefits rehab recovery reviewed with the patient detail.  She would like to have this done before the end of the year.  Questions encouraged and answered.  [name] will call her in the near future to schedule the surgery.  Follow-Up Instructions: No follow-ups on file.   Orders:  No orders of the defined types were placed in this encounter.  No orders of the defined types were placed in this encounter.     Procedures: No procedures performed   Clinical Data: No additional findings.   Subjective: Chief Complaint  Patient presents with   Right Knee - Pain    [name] is a very pleasant 60 year old female referral from Dr. Denyse Amass for lateral meniscus tear of the right knee found on recent MRI.  She is undergone 6 months of conservative management with Dr. Denyse Amass.  She had a cortisone injection which helped for a month.  She feels pain wrapping around the posterior lateral region of the knee and feels pain in the back of the knee.  Occasional catching and lots of grinding in her knee.  She feels swelling as well.  She is having trouble with ambulation and daily activities.   Review of Systems  Constitutional: Negative.   HENT: Negative.    Eyes: Negative.   Respiratory: Negative.    Cardiovascular: Negative.   Endocrine: Negative.   Musculoskeletal: Negative.   Neurological: Negative.    Hematological: Negative.   Psychiatric/Behavioral: Negative.    All other systems reviewed and are negative.   Objective: Vital Signs: Ht 5\' 5"  (1.651 m)   Wt 193 lb (87.5 kg)   BMI 32.12 kg/m   Physical Exam Vitals and nursing note reviewed.  Constitutional:      Appearance: She is well-developed.  Pulmonary:     Effort: Pulmonary effort is normal.  Skin:    General: Skin is warm.     Capillary Refill: Capillary refill takes less than 2 seconds.  Neurological:     Mental Status: She is alert and oriented to person, place, and time.  Psychiatric:        Behavior: Behavior normal.        Thought Content: Thought content normal.        Judgment: Judgment normal.    Ortho Exam  Right knee shows small joint effusion.  Tenderness along the lateral joint line.  Pain with McMurray testing.  Range of motion well-preserved.  Collaterals and cruciates are stable.  Specialty Comments:  No specialty comments available.  Imaging: No results found.   PMFS History: Patient Active Problem List   Diagnosis Date Noted   Acute lateral meniscus tear of right knee 02/21/2021   Baker's cyst of knee, right 11/30/2020   Pneumonia due to COVID-19 virus 04/13/2019  Hypokalemia 04/13/2019   Primary osteoarthritis of first carpometacarpal joint of left hand 05/03/2015   Radial styloid tenosynovitis 05/03/2015   PVC (premature ventricular contraction) 02/05/2014   Benign essential HTN 02/05/2014   Dyslipidemia 02/05/2014   Past Medical History:  Diagnosis Date   Back pain    Baker's cyst of knee, right    Constipation    Heart burn    Hyperlipidemia    Hypertension    IBS (irritable bowel syndrome)    Joint pain    Lactose intolerance    Muscle pain    Other fatigue    Palpitations    Pre-diabetes    PVC (premature ventricular contraction)    Ringing in ear    Sleep apnea    SOB (shortness of breath) on exertion    Vitamin D deficiency     Family History  Problem  Relation Age of Onset   Sudden death Mother    Hypertension Father    Stroke Father    Cancer Father    Alcoholism Father    Hyperlipidemia Sister    Hyperlipidemia Brother     Past Surgical History:  Procedure Laterality Date   APPENDECTOMY     carpal tunn     CESAREAN SECTION     NEUROMA SURGERY     SEPTOPLASTY     TONSILLECTOMY     TOTAL ABDOMINAL HYSTERECTOMY W/ BILATERAL SALPINGOOPHORECTOMY     TUBAL LIGATION     Social History   Occupational History   Occupation: Stay at home spouse  Tobacco Use   Smoking status: Never   Smokeless tobacco: Never  Vaping Use   Vaping Use: Never used  Substance and Sexual Activity   Alcohol use: Yes    Alcohol/week: 0.0 standard drinks    Comment: occsaionl wine   Drug use: Never   Sexual activity: Not on file

## 2021-02-22 ENCOUNTER — Other Ambulatory Visit: Payer: Self-pay

## 2021-02-28 ENCOUNTER — Ambulatory Visit (HOSPITAL_BASED_OUTPATIENT_CLINIC_OR_DEPARTMENT_OTHER): Payer: 59 | Attending: Cardiology | Admitting: Cardiology

## 2021-02-28 ENCOUNTER — Other Ambulatory Visit: Payer: Self-pay

## 2021-02-28 VITALS — Ht 64.0 in | Wt 192.0 lb

## 2021-02-28 DIAGNOSIS — R0902 Hypoxemia: Secondary | ICD-10-CM | POA: Diagnosis not present

## 2021-02-28 DIAGNOSIS — G4733 Obstructive sleep apnea (adult) (pediatric): Secondary | ICD-10-CM | POA: Diagnosis not present

## 2021-03-01 ENCOUNTER — Other Ambulatory Visit: Payer: Self-pay

## 2021-03-01 ENCOUNTER — Encounter (HOSPITAL_BASED_OUTPATIENT_CLINIC_OR_DEPARTMENT_OTHER): Payer: Self-pay | Admitting: Orthopaedic Surgery

## 2021-03-07 NOTE — Procedures (Signed)
° °  Patient Name: [name], [name] Date: 02/28/2021 Gender: Female D.O.B: 12-24-1960 Age (years): 7 Referring Provider: Armanda Magic MD, ABSM Height (inches): 64 Interpreting Physician: Armanda Magic MD, ABSM Weight (lbs): 192 RPSGT: Ulyess Mort BMI: 33 MRN: [id number] Neck Size: 13.00  CLINICAL INFORMATION The patient is referred for a CPAP titration to treat sleep apnea.  SLEEP STUDY TECHNIQUE As per the AASM Manual for the Scoring of Sleep and Associated Events v2.3 (April 2016) with a hypopnea requiring 4% desaturations.  The channels recorded and monitored were frontal, central and occipital EEG, electrooculogram (EOG), submentalis EMG (chin), nasal and oral airflow, thoracic and abdominal wall motion, anterior tibialis EMG, snore microphone, electrocardiogram, and pulse oximetry. Continuous positive airway pressure (CPAP) was initiated at the beginning of the study and titrated to treat sleep-disordered breathing.  MEDICATIONS Medications self-administered by patient taken the night of the study : N/A  TECHNICIAN COMMENTS Comments added by technician: Patient was ordered as a Cpap titration Comments added by scorer: N/A  RESPIRATORY PARAMETERS Optimal PAP Pressure (cm): 13  AHI at Optimal Pressure (/hr):0 Overall Minimal O2 (%):89.0  Supine % at Optimal Pressure (%):23 Minimal O2 at Optimal Pressure (%): 94.0   SLEEP ARCHITECTURE The study was initiated at 10:55:17 PM and ended at 5:04:39 AM.  Sleep onset time was 17.3 minutes and the sleep efficiency was 75.4%. The total sleep time was 278.5 minutes.  The patient spent 8.1% of the night in stage N1 sleep, 78.3% in stage N2 sleep, 0.0% in stage N3 and 13.6% in REM.Stage REM latency was 90.0 minutes  Wake after sleep onset was 73.6. Alpha intrusion was absent. Supine sleep was 14.72%.  CARDIAC DATA The 2 lead EKG demonstrated sinus rhythm. The mean heart rate was 52.5 beats per minute. Other EKG findings  include: PVCs.  LEG MOVEMENT DATA The total Periodic Limb Movements of Sleep (PLMS) were 0. The PLMS index was 0.0. A PLMS index of <15 is considered normal in adults.  IMPRESSIONS - The optimal PAP pressure was 13 cm of water. - Central sleep apnea was not noted during this titration (CAI = 0.4/h). - Mild oxygen desaturations were observed during this titration (min O2 = 89.0%). - The patient snored with soft snoring volume during this titration study. - 2-lead EKG demonstrated: PVCs - Clinically significant periodic limb movements were not noted during this study. Arousals associated with PLMs were rare.  DIAGNOSIS - Obstructive Sleep Apnea (G47.33)  RECOMMENDATIONS - Trial of CPAP therapy on 13 cm H2O with a Medium size Resmed Full Face Mask AirFit F20 mask and heated humidification. - Avoid alcohol, sedatives and other CNS depressants that may worsen sleep apnea and disrupt normal sleep architecture. - Sleep hygiene should be reviewed to assess factors that may improve sleep quality. - Weight management and regular exercise should be initiated or continued. - Return to Sleep Center for re-evaluation after 6 weeks of therapy  [Electronically signed] 03/07/2021 05:46 PM  Armanda Magic MD, ABSM Diplomate, American Board of Sleep Medicine

## 2021-03-08 ENCOUNTER — Encounter (HOSPITAL_BASED_OUTPATIENT_CLINIC_OR_DEPARTMENT_OTHER)
Admission: RE | Disposition: A | Discharge status: Home / Self Care | Payer: Self-pay | Source: Home / Self Care | Attending: Orthopaedic Surgery

## 2021-03-08 ENCOUNTER — Ambulatory Visit (HOSPITAL_BASED_OUTPATIENT_CLINIC_OR_DEPARTMENT_OTHER)
Admission: RE | Admit: 2021-03-08 | Discharge: 2021-03-08 | Disposition: A | Discharge status: Home / Self Care | Payer: 59 | Attending: Orthopaedic Surgery | Admitting: Orthopaedic Surgery

## 2021-03-08 ENCOUNTER — Other Ambulatory Visit: Payer: Self-pay

## 2021-03-08 ENCOUNTER — Encounter: Payer: Self-pay | Admitting: Orthopaedic Surgery

## 2021-03-08 ENCOUNTER — Ambulatory Visit (HOSPITAL_BASED_OUTPATIENT_CLINIC_OR_DEPARTMENT_OTHER): Payer: 59 | Admitting: Anesthesiology

## 2021-03-08 ENCOUNTER — Encounter (HOSPITAL_BASED_OUTPATIENT_CLINIC_OR_DEPARTMENT_OTHER): Payer: Self-pay | Admitting: Orthopaedic Surgery

## 2021-03-08 DIAGNOSIS — X58XXXA Exposure to other specified factors, initial encounter: Secondary | ICD-10-CM | POA: Diagnosis not present

## 2021-03-08 DIAGNOSIS — M2341 Loose body in knee, right knee: Secondary | ICD-10-CM | POA: Diagnosis not present

## 2021-03-08 DIAGNOSIS — R7303 Prediabetes: Secondary | ICD-10-CM | POA: Insufficient documentation

## 2021-03-08 DIAGNOSIS — Y939 Activity, unspecified: Secondary | ICD-10-CM | POA: Insufficient documentation

## 2021-03-08 DIAGNOSIS — M94261 Chondromalacia, right knee: Secondary | ICD-10-CM | POA: Diagnosis not present

## 2021-03-08 DIAGNOSIS — S83281A Other tear of lateral meniscus, current injury, right knee, initial encounter: Secondary | ICD-10-CM | POA: Diagnosis not present

## 2021-03-08 DIAGNOSIS — S83271A Complex tear of lateral meniscus, current injury, right knee, initial encounter: Secondary | ICD-10-CM | POA: Insufficient documentation

## 2021-03-08 HISTORY — PX: KNEE ARTHROSCOPY WITH LATERAL MENISECTOMY: SHX6193

## 2021-03-08 HISTORY — DX: Gastro-esophageal reflux disease without esophagitis: K21.9

## 2021-03-08 HISTORY — DX: Unspecified osteoarthritis, unspecified site: M19.90

## 2021-03-08 SURGERY — ARTHROSCOPY, KNEE, WITH LATERAL MENISCECTOMY
Anesthesia: General | Site: Knee | Laterality: Right

## 2021-03-08 MED ORDER — CEFAZOLIN SODIUM-DEXTROSE 2-4 GM/100ML-% IV SOLN
2.0000 g | INTRAVENOUS | Status: AC
  Administered 2021-03-08: 12:00:00 2 g via INTRAVENOUS

## 2021-03-08 MED ORDER — OXYCODONE HCL 5 MG PO TABS
ORAL_TABLET | ORAL | Status: AC
  Filled 2021-03-08: qty 1

## 2021-03-08 MED ORDER — OXYCODONE HCL 5 MG/5ML PO SOLN: 5.0000 mg | Freq: Once | ORAL | Status: AC | PRN

## 2021-03-08 MED ORDER — FENTANYL CITRATE (PF) 100 MCG/2ML IJ SOLN
INTRAMUSCULAR | Status: AC
  Filled 2021-03-08: qty 2

## 2021-03-08 MED ORDER — AMISULPRIDE (ANTIEMETIC) 5 MG/2ML IV SOLN: 10.0000 mg | Freq: Once | INTRAVENOUS | Status: DC | PRN

## 2021-03-08 MED ORDER — PROMETHAZINE HCL 25 MG/ML IJ SOLN: 6.2500 mg | INTRAMUSCULAR | Status: DC | PRN

## 2021-03-08 MED ORDER — MEPERIDINE HCL 25 MG/ML IJ SOLN: 6.2500 mg | INTRAMUSCULAR | Status: DC | PRN

## 2021-03-08 MED ORDER — BUPIVACAINE HCL (PF) 0.25 % IJ SOLN
INTRAMUSCULAR | Status: DC | PRN
  Administered 2021-03-08: 20 mL

## 2021-03-08 MED ORDER — LIDOCAINE 2% (20 MG/ML) 5 ML SYRINGE
INTRAMUSCULAR | Status: DC | PRN
  Administered 2021-03-08: 60 mg via INTRAVENOUS

## 2021-03-08 MED ORDER — FENTANYL CITRATE (PF) 100 MCG/2ML IJ SOLN
INTRAMUSCULAR | Status: DC | PRN
  Administered 2021-03-08 (×2): 25 ug via INTRAVENOUS

## 2021-03-08 MED ORDER — ONDANSETRON HCL 4 MG/2ML IJ SOLN
INTRAMUSCULAR | Status: AC
  Filled 2021-03-08: qty 2

## 2021-03-08 MED ORDER — PROPOFOL 10 MG/ML IV BOLUS
INTRAVENOUS | Status: AC
  Filled 2021-03-08: qty 20

## 2021-03-08 MED ORDER — PROPOFOL 10 MG/ML IV BOLUS
INTRAVENOUS | Status: DC | PRN
  Administered 2021-03-08: 200 mg via INTRAVENOUS

## 2021-03-08 MED ORDER — HYDROCODONE-ACETAMINOPHEN 5-325 MG PO TABS: 1.0000 | ORAL_TABLET | Freq: Four times a day (QID) | ORAL | 0 refills | Status: DC | PRN

## 2021-03-08 MED ORDER — LACTATED RINGERS IV SOLN: INTRAVENOUS | Status: DC

## 2021-03-08 MED ORDER — OXYCODONE HCL 5 MG PO TABS
5.0000 mg | ORAL_TABLET | Freq: Once | ORAL | Status: AC | PRN
  Administered 2021-03-08: 13:00:00 5 mg via ORAL

## 2021-03-08 MED ORDER — HYDROMORPHONE HCL 1 MG/ML IJ SOLN: 0.2500 mg | INTRAMUSCULAR | Status: DC | PRN

## 2021-03-08 MED ORDER — LIDOCAINE 2% (20 MG/ML) 5 ML SYRINGE
INTRAMUSCULAR | Status: AC
  Filled 2021-03-08: qty 5

## 2021-03-08 MED ORDER — CEFAZOLIN SODIUM-DEXTROSE 2-4 GM/100ML-% IV SOLN
INTRAVENOUS | Status: AC
  Filled 2021-03-08: qty 100

## 2021-03-08 MED ORDER — DEXAMETHASONE SODIUM PHOSPHATE 10 MG/ML IJ SOLN
INTRAMUSCULAR | Status: DC | PRN
  Administered 2021-03-08: 10 mg via INTRAVENOUS

## 2021-03-08 MED ORDER — ONDANSETRON HCL 4 MG/2ML IJ SOLN
INTRAMUSCULAR | Status: DC | PRN
  Administered 2021-03-08: 4 mg via INTRAVENOUS

## 2021-03-08 MED ORDER — BUPIVACAINE HCL (PF) 0.25 % IJ SOLN
INTRAMUSCULAR | Status: AC
  Filled 2021-03-08: qty 30

## 2021-03-08 MED ORDER — DEXAMETHASONE SODIUM PHOSPHATE 10 MG/ML IJ SOLN
INTRAMUSCULAR | Status: AC
  Filled 2021-03-08: qty 1

## 2021-03-08 MED ORDER — MIDAZOLAM HCL 5 MG/5ML IJ SOLN
INTRAMUSCULAR | Status: DC | PRN
Start: 2021-03-08 — End: 2021-03-08
  Administered 2021-03-08: 2 mg via INTRAVENOUS

## 2021-03-08 MED ORDER — MIDAZOLAM HCL 2 MG/2ML IJ SOLN
INTRAMUSCULAR | Status: AC
  Filled 2021-03-08: qty 2

## 2021-03-08 MED ORDER — SODIUM CHLORIDE 0.9 % IR SOLN
Status: DC | PRN
  Administered 2021-03-08: 3000 mL

## 2021-03-08 MED ORDER — LACTATED RINGERS IV SOLN: INTRAVENOUS | Status: DC | PRN

## 2021-03-08 SURGICAL SUPPLY — 37 items
BANDAGE ESMARK 6X9 LF (GAUZE/BANDAGES/DRESSINGS) IMPLANT
BLADE EXCALIBUR 4.0MM X 13CM (MISCELLANEOUS) ×1
BLADE EXCALIBUR 4.0X13 (MISCELLANEOUS) ×2 IMPLANT
BLADE SHAVER TORPEDO 4X13 (MISCELLANEOUS) ×2 IMPLANT
BNDG CMPR 9X6 STRL LF SNTH (GAUZE/BANDAGES/DRESSINGS)
BNDG ELASTIC 6X5.8 VLCR STR LF (GAUZE/BANDAGES/DRESSINGS) ×6 IMPLANT
BNDG ESMARK 6X9 LF (GAUZE/BANDAGES/DRESSINGS)
BURR OVAL 8 FLU 4.0MM X 13CM (MISCELLANEOUS)
BURR OVAL 8 FLU 4.0X13 (MISCELLANEOUS) IMPLANT
COOLER ICEMAN CLASSIC (MISCELLANEOUS) ×3 IMPLANT
CUFF TOURN SGL QUICK 34 (TOURNIQUET CUFF) ×3
CUFF TRNQT CYL 34X4.125X (TOURNIQUET CUFF) ×1 IMPLANT
CUTTER BONE 4.0MM X 13CM (MISCELLANEOUS) IMPLANT
DRAPE ARTHROSCOPY W/POUCH 90 (DRAPES) ×3 IMPLANT
DRAPE IMP U-DRAPE 54X76 (DRAPES) ×3 IMPLANT
DRAPE U-SHAPE 47X51 STRL (DRAPES) ×3 IMPLANT
DURAPREP 26ML APPLICATOR (WOUND CARE) ×6 IMPLANT
EXCALIBUR 3.8MM X 13CM (MISCELLANEOUS) IMPLANT
GAUZE 4X4 16PLY ~~LOC~~+RFID DBL (SPONGE) ×2 IMPLANT
GAUZE SPONGE 4X4 12PLY STRL (GAUZE/BANDAGES/DRESSINGS) ×3 IMPLANT
GAUZE XEROFORM 1X8 LF (GAUZE/BANDAGES/DRESSINGS) ×3 IMPLANT
GLOVE SURG SYN 7.5  E (GLOVE) ×3
GLOVE SURG SYN 7.5 E (GLOVE) ×1 IMPLANT
GLOVE SURG SYN 7.5 PF PI (GLOVE) ×1 IMPLANT
GLOVE SURG UNDER POLY LF SZ7 (GLOVE) ×6 IMPLANT
GLOVE SURG UNDER POLY LF SZ7.5 (GLOVE) ×3 IMPLANT
GOWN STRL REIN XL XLG (GOWN DISPOSABLE) ×6 IMPLANT
GOWN STRL REUS W/ TWL LRG LVL3 (GOWN DISPOSABLE) ×1 IMPLANT
GOWN STRL REUS W/TWL LRG LVL3 (GOWN DISPOSABLE) ×3
MANIFOLD NEPTUNE II (INSTRUMENTS) ×3 IMPLANT
PACK ARTHROSCOPY DSU (CUSTOM PROCEDURE TRAY) ×3 IMPLANT
PACK BASIN DAY SURGERY FS (CUSTOM PROCEDURE TRAY) ×3 IMPLANT
PAD COLD SHLDR WRAP-ON (PAD) ×3 IMPLANT
SHEET MEDIUM DRAPE 40X70 STRL (DRAPES) ×3 IMPLANT
SUT ETHILON 3 0 PS 1 (SUTURE) ×3 IMPLANT
TOWEL GREEN STERILE FF (TOWEL DISPOSABLE) ×3 IMPLANT
TUBING ARTHROSCOPY IRRIG 16FT (MISCELLANEOUS) ×3 IMPLANT

## 2021-03-08 NOTE — Anesthesia Postprocedure Evaluation (Signed)
Anesthesia Post Note  Patient: [name]  Procedure(s) Performed: RIGHT KNEE PARTIAL LATERAL MENISCECTOMY (Right: Knee)     Patient location during evaluation: PACU Anesthesia Type: General Level of consciousness: awake and alert Pain management: pain level controlled Vital Signs Assessment: post-procedure vital signs reviewed and stable Respiratory status: spontaneous breathing, nonlabored ventilation and respiratory function stable Cardiovascular status: blood pressure returned to baseline and stable Postop Assessment: no apparent nausea or vomiting Anesthetic complications: no   No notable events documented.  Last Vitals:  Vitals:   03/08/21 1300 03/08/21 1325  BP: 120/62 (!) 155/74  Pulse: (!) 52 (!) 55  Resp: 15 16  Temp:    SpO2: 94% 98%    Last Pain:  Vitals:   03/08/21 1325  TempSrc:   PainSc: 4                  Lowella Curb

## 2021-03-08 NOTE — Transfer of Care (Signed)
Immediate Anesthesia Transfer of Care Note  Patient: [name]  Procedure(s) Performed: RIGHT KNEE PARTIAL LATERAL MENISCECTOMY (Right: Knee)  Patient Location: PACU  Anesthesia Type:General  Level of Consciousness: sedated and patient cooperative  Airway & Oxygen Therapy: Patient Spontanous Breathing and Patient connected to face mask oxygen  Post-op Assessment: Report given to RN and Post -op Vital signs reviewed and stable  Post vital signs: Reviewed and stable  Last Vitals:  Vitals Value Taken Time  BP 102/68 03/08/21 1241  Temp 36.6 C 03/08/21 1241  Pulse 49 03/08/21 1243  Resp 9 03/08/21 1243  SpO2 97 % 03/08/21 1243  Vitals shown include unvalidated device data.  Last Pain:  Vitals:   03/08/21 1013  TempSrc: Oral  PainSc: 0-No pain         Complications: No notable events documented.

## 2021-03-08 NOTE — H&P (Signed)
PREOPERATIVE H&P  Chief Complaint: right knee lateral meniscal tear  HPI: [name] is a 61 y.o. female who presents for surgical treatment of right knee lateral meniscal tear.  She denies any changes in medical history.  Past Medical History:  Diagnosis Date   Arthritis    low back   Back pain    Baker's cyst of knee, right    Constipation    GERD (gastroesophageal reflux disease)    Heart burn    Hyperlipidemia    Hypertension    IBS (irritable bowel syndrome)    Joint pain    Lactose intolerance    Muscle pain    Other fatigue    Palpitations    Pre-diabetes    PVC (premature ventricular contraction)    Ringing in ear    Sleep apnea    SOB (shortness of breath) on exertion    Vitamin D deficiency    Past Surgical History:  Procedure Laterality Date   APPENDECTOMY     carpal tunn     CESAREAN SECTION     NEUROMA SURGERY     SEPTOPLASTY     TONSILLECTOMY     TOTAL ABDOMINAL HYSTERECTOMY W/ BILATERAL SALPINGOOPHORECTOMY     TUBAL LIGATION     Social History   Socioeconomic History   Marital status: Married    Spouse name: Not on file   Number of children: Not on file   Years of education: Not on file   Highest education level: Not on file  Occupational History   Occupation: Stay at home spouse  Tobacco Use   Smoking status: Never   Smokeless tobacco: Never  Vaping Use   Vaping Use: Never used  Substance and Sexual Activity   Alcohol use: Not Currently   Drug use: Never   Sexual activity: Not on file  Other Topics Concern   Not on file  Social History Narrative   Tobacco use   Cigarettes: Never smoked   Tobacco history last updated 11/03/2013   No smoking   No alcohol    Yes caffeine   Recreational drug use  : no   Diet : regular    Exercise, Minimal   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   Social Determinants of Health    Financial Resource Strain: Not on file  Food Insecurity: Not on file  Transportation Needs: Not on file  Physical Activity: Not on file  Stress: Not on file  Social Connections: Not on file   Family History  Problem Relation Age of Onset   Sudden death Mother    Hypertension Father    Stroke Father    Cancer Father    Alcoholism Father    Hyperlipidemia Sister    Hyperlipidemia Brother    Allergies  Allergen Reactions   Buprenorphine Hcl Itching   Morphine And Related Itching   Morphine Sulfate Other (See Comments)   Other    Short Ragweed Pollen Ext Hives   Prior to Admission medications   Medication Sig Start Date End Date Taking? Authorizing Provider  aspirin 81 MG chewable tablet Chew 1 tablet (81 mg total) by mouth daily. 05/02/19  Yes Leroy Sea, MD  atorvastatin (LIPITOR) 40 MG tablet Take 1 tablet (40 mg total) by mouth daily. 03/25/20  Yes Quintella Reichert, MD  carboxymethylcellulose (REFRESH  PLUS) 0.5 % SOLN 1 drop. 2 drops each eye   Yes [provider]  estradiol (VIVELLE-DOT) 0.05 MG/24HR patch Place 1 patch onto the skin 2 (two) times a week.  01/08/14  Yes [provider]  famotidine (PEPCID) 20 MG tablet Take 1 tablet (20 mg total) by mouth daily. 03/25/20  Yes Quintella Reichert, MD  flecainide (TAMBOCOR) 150 MG tablet Take 1/2 (one-half) tablet by mouth twice daily 07/06/20  Yes Marinus Maw, MD  hydrochlorothiazide (HYDRODIURIL) 25 MG tablet Take 1 tablet (25 mg total) by mouth daily. 10/19/20  Yes Beasley, Caren D, MD  metoprolol succinate (TOPROL-XL) 50 MG 24 hr tablet TAKE 1 TABLET BY MOUTH ONCE DAILY **TAKE  WITH  OR  IMMEDIATELY  FOLLOWING  A  MEAL 11/02/20  Yes Tillery, Mariam Dollar, PA-C  potassium chloride 20 MEQ TBCR Take 20 mEq by mouth daily. 02/08/14  Yes Quintella Reichert, MD  Probiotic Product (PROBIOTIC-10 PO) Take by mouth as needed.   Yes [provider]  Vitamin D, Ergocalciferol, (DRISDOL) 1.25 MG (50000 UNIT) CAPS  capsule Take 1 capsule (50,000 Units total) by mouth every 7 (seven) days. 02/16/21  Yes Beasley, Caren D, MD  azelastine (ASTELIN) 0.1 % nasal spray Place into both nostrils 2 (two) times daily. Use in each nostril as directed    [provider]  Ipratropium-Albuterol (COMBIVENT RESPIMAT) 20-100 MCG/ACT AERS respimat Inhale 1 puff into the lungs every 6 (six) hours.    [provider]  meloxicam (MOBIC) 15 MG tablet Take 15 mg by mouth daily.    [provider]  montelukast (SINGULAIR) 10 MG tablet Take 10 mg by mouth at bedtime.    [provider]  PROAIR HFA 108 [phone] Base) MCG/ACT inhaler Inhale 2 puffs into the lungs as needed for shortness of breath. 01/30/18   [provider]     Positive ROS: All other systems have been reviewed and were otherwise negative with the exception of those mentioned in the HPI and as above.  Physical Exam: General: Alert, no acute distress Cardiovascular: No pedal edema Respiratory: No cyanosis, no use of accessory musculature GI: abdomen soft Skin: No lesions in the area of chief complaint Neurologic: Sensation intact distally Psychiatric: Patient is competent for consent with normal mood and affect Lymphatic: no lymphedema  MUSCULOSKELETAL: exam stable  Assessment: right knee lateral meniscal tear  Plan: Plan for Procedure(s): RIGHT KNEE PARTIAL LATERAL MENISCECTOMY  The risks benefits and alternatives were discussed with the patient including but not limited to the risks of nonoperative treatment, versus surgical intervention including infection, bleeding, nerve injury,  blood clots, cardiopulmonary complications, morbidity, mortality, among others, and they were willing to proceed.   Preoperative templating of the joint replacement has been completed, documented, and submitted to the Operating Room personnel in order to optimize intra-operative equipment management.   Glee Arvin, MD 03/08/2021 10:46  AM

## 2021-03-08 NOTE — Discharge Instructions (Addendum)
° ° °Post-operative patient instructions  °Knee Arthroscopy  ° °Ice:  Place intermittent ice or cooler pack over your knee, 30 minutes on and 30 minutes off.  Continue this for the first 72 hours after surgery, then save ice for use after therapy sessions or on more active days.   °Weight:  You may bear weight on your leg as your symptoms allow. °Crutches:  Use crutches (or walker) to assist in walking until told to discontinue by your physical therapist or physician. This will help to reduce pain. °Strengthening:  Perform simple thigh squeezes (isometric quad contractions) and straight leg lifts as you are able (3 sets of 5 to 10 repetitions, 3 times a day).  For the leg lifts, have someone support under your ankle in the beginning until you have increased strength enough to do this on your own.  To help get started on thigh squeezes, place a pillow under your knee and push down on the pillow with back of knee (sometimes easier to do than with your leg fully straight). °Motion:  Perform gentle knee motion as tolerated - this is gentle bending and straightening of the knee. Seated heel slides: you can start by sitting in a chair, remove your brace, and gently slide your heel back on the floor - allowing your knee to bend. Have someone help you straighten your knee (or use your other leg/foot hooked under your ankle.  °Dressing:  Perform 1st dressing change at 2 days postoperative. A moderate amount of blood tinged drainage is to be expected.  So if you bleed through the dressing on the first or second day or if you have fevers, it is fine to change the dressing/check the wounds early and redress wound. Elevate your leg.  If it bleeds through again, or if the incisions are leaking frank blood, please call the office. May change dressing every 1-2 days thereafter to help watch wounds. Can purchase Tegaderm (or 3M Nexcare) water resistant dressings at local pharmacy / [facility]. °Shower:  Light shower is ok after 2  days.  Please take shower, NO bath. Recover with gauze and ace wrap to help keep wounds protected.   °Pain medication:  A narcotic pain medication has been prescribed.  Take as directed.  Typically you need narcotic pain medication more regularly during the first 3 to 5 days after surgery.  Decrease your use of the medication as the pain improves.  Narcotics can sometimes cause constipation, even after a few doses.  If you have problems with constipation, you can take an over the counter stool softener or light laxative.  If you have persistent problems, please notify your physician’s office. °Physical therapy: Additional activity guidelines to be provided by your physician or physical therapist at follow-up visits.  °Driving: Do not recommend driving x 2 weeks post surgical, especially if surgery performed on right side. Should not drive while taking narcotic pain medications. It typically takes at least 2 weeks to restore sufficient neuromuscular function for normal reaction times for driving safety.  °Call [phone] for questions or problems. Evenings you will be forwarded to the hospital operator.  Ask for the orthopaedic physician on call. Please call if you experience:  °  °Redness, foul smelling, or persistent drainage from the surgical site  °worsening knee pain and swelling not responsive to medication  °any calf pain and or swelling of the lower leg  °temperatures greater than 101.5 F °other questions or concerns ° ° °Thank you for allowing us to be   a part of your care. ° ° °Post Anesthesia Home Care Instructions ° °Activity: °Get plenty of rest for the remainder of the day. A responsible individual must stay with you for 24 hours following the procedure.  °For the next 24 hours, DO NOT: °-Drive a car °-Operate machinery °-Drink alcoholic beverages °-Take any medication unless instructed by your physician °-Make any legal decisions or sign important papers. ° °Meals: °Start with liquid foods such as  gelatin or soup. Progress to regular foods as tolerated. Avoid greasy, spicy, heavy foods. If nausea and/or vomiting occur, drink only clear liquids until the nausea and/or vomiting subsides. Call your physician if vomiting continues. ° °Special Instructions/Symptoms: °Your throat may feel dry or sore from the anesthesia or the breathing tube placed in your throat during surgery. If this causes discomfort, gargle with warm salt water. The discomfort should disappear within 24 hours. ° °If you had a scopolamine patch placed behind your ear for the management of post- operative nausea and/or vomiting: ° °1. The medication in the patch is effective for 72 hours, after which it should be removed.  Wrap patch in a tissue and discard in the trash. Wash hands thoroughly with soap and water. °2. You may remove the patch earlier than 72 hours if you experience unpleasant side effects which may include dry mouth, dizziness or visual disturbances. °3. Avoid touching the patch. Wash your hands with soap and water after contact with the patch. °    ° °

## 2021-03-08 NOTE — Anesthesia Preprocedure Evaluation (Signed)
Anesthesia Evaluation  Patient identified by MRN, date of birth, ID band Patient awake    Reviewed: Allergy & Precautions, NPO status , Patient's Chart, lab work & pertinent test results  Airway Mallampati: II  TM Distance: >3 FB Neck ROM: Full    Dental no notable dental hx.    Pulmonary sleep apnea ,    Pulmonary exam normal breath sounds clear to auscultation       Cardiovascular hypertension, negative cardio ROS Normal cardiovascular exam Rhythm:Regular Rate:Normal     Neuro/Psych negative neurological ROS  negative psych ROS   GI/Hepatic negative GI ROS, Neg liver ROS,   Endo/Other  negative endocrine ROS  Renal/GU negative Renal ROS  negative genitourinary   Musculoskeletal  (+) Arthritis , Osteoarthritis,    Abdominal   Peds negative pediatric ROS (+)  Hematology negative hematology ROS (+)   Anesthesia Other Findings   Reproductive/Obstetrics negative OB ROS                             Anesthesia Physical Anesthesia Plan  ASA: 2  Anesthesia Plan: General   Post-op Pain Management:    Induction: Intravenous  PONV Risk Score and Plan: 3 and Ondansetron, Dexamethasone, Midazolam and Treatment may vary due to age or medical condition  Airway Management Planned: LMA  Additional Equipment:   Intra-op Plan:   Post-operative Plan: Extubation in OR  Informed Consent: I have reviewed the patients History and Physical, chart, labs and discussed the procedure including the risks, benefits and alternatives for the proposed anesthesia with the patient or authorized representative who has indicated his/her understanding and acceptance.     Dental advisory given  Plan Discussed with: CRNA  Anesthesia Plan Comments:         Anesthesia Quick Evaluation

## 2021-03-08 NOTE — Op Note (Signed)
° °  Surgery Date: 03/08/2021  PREOPERATIVE DIAGNOSES:  1. Right knee lateral meniscus tear  POSTOPERATIVE DIAGNOSES:  1.  Right knee lateral meniscus tear 2.  Right knee loose bodies 3.  Right knee chondromalacia  PROCEDURES PERFORMED:  1. Right knee arthroscopy with arthroscopic partial lateral meniscectomy 2.  Right knee arthroscopy with removal of loose bodies 3. Right knee arthroscopy with arthroscopic chondroplasty lateral tibial plateau and patella  SURGEON: N. Glee Arvin, M.D.  ASSIST: Starlyn Skeans [place], [place]; necessary for the timely completion of procedure and due to complexity of procedure.  ANESTHESIA:  general  FLUIDS: Per anesthesia record.   ESTIMATED BLOOD LOSS: minimal  DESCRIPTION OF PROCEDURE: [name] is a 60 y.o.-year-old female with above mentioned conditions. Full discussion held regarding risks benefits alternatives and complications related surgical intervention. Conservative care options reviewed. All questions answered.  The patient was identified in the preoperative holding area and the operative extremity was marked. The patient was brought to the operating room and transferred to operating table in a supine position. Satisfactory general anesthesia was induced by anesthesiology.    Standard anterolateral, anteromedial arthroscopy portals were obtained. The anteromedial portal was obtained with a spinal needle for localization under direct visualization with subsequent diagnostic findings.   Diagnostic knee arthroscopy and limited synovectomy was performed for visualization.  Gentle valgus force was placed on the knee in order to evaluate the medial compartment which was unremarkable except for some mild chondromalacia.  The medial meniscus was unremarkable.  The cruciates were unremarkable.  I did find to loose chondral bodies within the femoral notch that were greater than 5 mm.  These were removed without any difficulty.  We then placed the knee into  the figure-of-four position.  We did find macerated complex tear of the posterior horn and the mid body.  This was debrided back to stable borders.  I used the probe to assess the posterior horn for the horizontal tear but I could not find any evidence of a tear that corresponded to the MRI findings in that location.  There was small area of grade III chondromalacia of the lateral tibial plateau.  The knee was then placed into extension and the patellofemoral surface showed grade II-III chondromalacia of the patella.  The femoral trochlea was unremarkable.  Gutters were checked for loose bodies.  Excess fluid was removed from the knee joint.  Incisions were closed with interrupted nylon sutures.  Sterile dressings were applied.  Patient tolerated procedure well had no immediate complications.  Suprapatellar pouch and gutters: mild synovitis or debris. Patella chondral surface: Grade 2 Trochlear chondral surface: Grade 0 Patellofemoral tracking: normal Medial meniscus: normal.  Medial femoral condyle weight bearing surface: Grade 1 Medial tibial plateau: Grade 1 Anterior cruciate ligament:stable Posterior cruciate ligament:stable Lateral meniscus: complex tear.   Lateral femoral condyle weight bearing surface: Grade 1 Lateral tibial plateau: Grade 3  DISPOSITION: The patient was awakened from general anesthetic, extubated, taken to the recovery room in medically stable condition, no apparent complications. The patient may be weightbearing as tolerated to the operative lower extremity.  Range of motion of right knee as tolerated.  Amy Reel, MD [facility] 12:30 PM

## 2021-03-08 NOTE — Anesthesia Procedure Notes (Signed)
Procedure Name: LMA Insertion Date/Time: 03/08/2021 12:04 PM Performed by: Lucinda Dell, CRNA Pre-anesthesia Checklist: Patient identified, Suction available, Emergency Drugs available and Patient being monitored Patient Re-evaluated:Patient Re-evaluated prior to induction Oxygen Delivery Method: Circle system utilized Preoxygenation: Pre-oxygenation with 100% oxygen Induction Type: IV induction Ventilation: Mask ventilation without difficulty LMA: LMA inserted LMA Size: 4.0 Number of attempts: 1 Placement Confirmation: positive ETCO2 and breath sounds checked- equal and bilateral Tube secured with: Tape Dental Injury: Teeth and Oropharynx as per pre-operative assessment

## 2021-03-09 ENCOUNTER — Ambulatory Visit (INDEPENDENT_AMBULATORY_CARE_PROVIDER_SITE_OTHER): Payer: 59 | Admitting: Family Medicine

## 2021-03-09 ENCOUNTER — Encounter (HOSPITAL_BASED_OUTPATIENT_CLINIC_OR_DEPARTMENT_OTHER): Payer: Self-pay | Admitting: Orthopaedic Surgery

## 2021-03-15 ENCOUNTER — Ambulatory Visit (INDEPENDENT_AMBULATORY_CARE_PROVIDER_SITE_OTHER): Payer: 59 | Admitting: Physician Assistant

## 2021-03-15 ENCOUNTER — Other Ambulatory Visit: Payer: Self-pay | Admitting: Cardiology

## 2021-03-15 ENCOUNTER — Other Ambulatory Visit: Payer: Self-pay

## 2021-03-15 ENCOUNTER — Encounter: Payer: Self-pay | Admitting: Orthopaedic Surgery

## 2021-03-15 DIAGNOSIS — Z9889 Other specified postprocedural states: Secondary | ICD-10-CM

## 2021-03-15 NOTE — Progress Notes (Signed)
Post-Op Visit Note   Patient: [name]           Date of Birth: [birth year]           MRN: [id number] Visit Date: 03/15/2021 PCP: Shirlean Mylar, MD   Assessment & Plan:  Chief Complaint:  Chief Complaint  Patient presents with   Right Knee - Routine Post Op   Visit Diagnoses:  1. S/P right knee arthroscopy     Plan: Patient is a pleasant 60 year old female who comes in today 1 week status post right knee arthroscopic debridement lateral meniscus and chondroplasty 03/08/2021.  She has been doing okay in regards to the knee itself.  She still notes the chronic discomfort to the right calf which she states is unchanged from prior to her right lower extremity ultrasound which showed a Baker's cyst and no evidence of DVT.  She denies any chest pain or shortness of breath.  Examination of the right knee reveals well-healed surgical portals without complication.  Calf is mildly tender.  No warmth or skin changes.  Negative Homans.  She is neurovascular intact distally.  Today, sutures were removed and Steri-Strips applied.  Intraoperative pictures reviewed.  Home exercise program provided.  She will follow-up with [place] in 5 weeks time for recheck.  We have discussed that should her calf pain worsen or she develop any new symptoms to include swelling or skin changes or even warmth that she give [place] a call.  If she develops any chest pain or shortness of breath she will go to the ED.  Follow-Up Instructions: Return in about 5 weeks (around 04/19/2021).   Orders:  No orders of the defined types were placed in this encounter.  No orders of the defined types were placed in this encounter.   Imaging: No new imaging  PMFS History: Patient Active Problem List   Diagnosis Date Noted   Chondral loose body of right knee joint    Hypoxemia 02/28/2021   Acute lateral meniscus tear of right knee 02/21/2021   Baker's cyst of knee, right 11/30/2020   Pneumonia due to COVID-19 virus 04/13/2019    Hypokalemia 04/13/2019   Primary osteoarthritis of first carpometacarpal joint of left hand 05/03/2015   Radial styloid tenosynovitis 05/03/2015   PVC (premature ventricular contraction) 02/05/2014   Benign essential HTN 02/05/2014   Dyslipidemia 02/05/2014   Past Medical History:  Diagnosis Date   Arthritis    low back   Back pain    Baker's cyst of knee, right    Constipation    GERD (gastroesophageal reflux disease)    Heart burn    Hyperlipidemia    Hypertension    IBS (irritable bowel syndrome)    Joint pain    Lactose intolerance    Muscle pain    Other fatigue    Palpitations    Pre-diabetes    PVC (premature ventricular contraction)    Ringing in ear    Sleep apnea    SOB (shortness of breath) on exertion    Vitamin D deficiency     Family History  Problem Relation Age of Onset   Sudden death Mother    Hypertension Father    Stroke Father    Cancer Father    Alcoholism Father    Hyperlipidemia Sister    Hyperlipidemia Brother     Past Surgical History:  Procedure Laterality Date   APPENDECTOMY     carpal tunn     CESAREAN SECTION  KNEE ARTHROSCOPY WITH LATERAL MENISECTOMY Right 03/08/2021   Procedure: RIGHT KNEE PARTIAL LATERAL MENISCECTOMY;  Surgeon: Tarry Kos, MD;  Location: [facility];  Service: Orthopedics;  Laterality: Right;   NEUROMA SURGERY     SEPTOPLASTY     TONSILLECTOMY     TOTAL ABDOMINAL HYSTERECTOMY W/ BILATERAL SALPINGOOPHORECTOMY     TUBAL LIGATION     Social History   Occupational History   Occupation: Stay at home spouse  Tobacco Use   Smoking status: Never   Smokeless tobacco: Never  Vaping Use   Vaping Use: Never used  Substance and Sexual Activity   Alcohol use: Not Currently   Drug use: Never   Sexual activity: Not on file

## 2021-03-17 ENCOUNTER — Encounter: Payer: Self-pay | Admitting: Cardiology

## 2021-03-20 ENCOUNTER — Other Ambulatory Visit: Payer: Self-pay | Admitting: Cardiology

## 2021-03-20 DIAGNOSIS — G4733 Obstructive sleep apnea (adult) (pediatric): Secondary | ICD-10-CM

## 2021-03-24 ENCOUNTER — Telehealth: Payer: Self-pay | Admitting: *Deleted

## 2021-03-24 DIAGNOSIS — G4733 Obstructive sleep apnea (adult) (pediatric): Secondary | ICD-10-CM

## 2021-03-24 NOTE — Telephone Encounter (Signed)
-----   Message from Sueanne Margarita, MD sent at 03/07/2021  5:50 PM EST ----- Please let patient know that they had a successful PAP titration and let DME know that orders are in EPIC.  Please set up 6 week OV with me.

## 2021-03-24 NOTE — Telephone Encounter (Signed)
The patient has been notified of the result and verbalized understanding.  All questions (if any) were answered. Latrelle Dodrill, CMA 03/24/2021 2:06 PM    Upon patient request DME selection is [facility] Patient understands he will be contacted by [facility] to set up his cpap. Patient understands to call if [facility] does not contact him with new setup in a timely manner. Patient understands they will be called once confirmation has been received from [facility]/ that they have received their new machine to schedule 10 week follow up appointment.   [facility] notified of new cpap order  Please add to airview Patient was grateful for the call and thanked me

## 2021-03-27 NOTE — Telephone Encounter (Signed)
Return call: Patients request has been resolved.

## 2021-03-27 NOTE — Telephone Encounter (Signed)
Called and Left detailed message on voicemail and informed patient to call back.

## 2021-03-30 ENCOUNTER — Encounter (INDEPENDENT_AMBULATORY_CARE_PROVIDER_SITE_OTHER): Payer: Self-pay | Admitting: Family Medicine

## 2021-03-30 ENCOUNTER — Ambulatory Visit (INDEPENDENT_AMBULATORY_CARE_PROVIDER_SITE_OTHER): Payer: 59 | Admitting: Family Medicine

## 2021-03-30 ENCOUNTER — Other Ambulatory Visit: Payer: Self-pay

## 2021-03-30 VITALS — BP 129/66 | HR 57 | Temp 98.1°F | Ht 64.0 in | Wt 186.0 lb

## 2021-03-30 DIAGNOSIS — G4733 Obstructive sleep apnea (adult) (pediatric): Secondary | ICD-10-CM

## 2021-03-30 DIAGNOSIS — Z6832 Body mass index (BMI) 32.0-32.9, adult: Secondary | ICD-10-CM

## 2021-03-30 DIAGNOSIS — E559 Vitamin D deficiency, unspecified: Secondary | ICD-10-CM

## 2021-03-30 DIAGNOSIS — Z9189 Other specified personal risk factors, not elsewhere classified: Secondary | ICD-10-CM

## 2021-03-30 DIAGNOSIS — E669 Obesity, unspecified: Secondary | ICD-10-CM | POA: Diagnosis not present

## 2021-03-30 MED ORDER — VITAMIN D (ERGOCALCIFEROL) 1.25 MG (50000 UNIT) PO CAPS: 50000.0000 [IU] | ORAL_CAPSULE | ORAL | 0 refills | Status: DC

## 2021-04-03 NOTE — Progress Notes (Signed)
Chief Complaint:   OBESITY [name] is here to discuss her progress with her obesity treatment plan along with follow-up of her obesity related diagnoses. [name] is on the Category 2 Plan and states she is following her eating plan approximately 80-85% of the time. [name] states she is doing 0 minutes 0 times per week.  Today's visit was #: 14 Starting weight: 228 lbs Starting date: 07/20/2020 Today's weight: 186 lbs Today's date: 03/30/2021 Total lbs lost to date: 42 Total lbs lost since last in-office visit: 1  Interim History: [name] is caring for her husband who is recovering from a stroke. Her stress level is high and meal planning has been difficult. She has been mindful however, and she has tried to make better choices with her food.  Subjective:   1. Vitamin D deficiency [name] is on Vit D, and her last level was at goal. She denies nausea, vomiting, or muscle weakness. I discussed labs with the patient today.  2. Obstructive sleep apnea [name] has a new diagnosis of mild obstructive sleep apnea. She is working to get her CPAP and start using it. I discussed labs with the patient today.  3. At risk for impaired metabolic function [name] is at increased risk for impaired metabolic function if obstructive sleep apnea is uncontrolled.  Assessment/Plan:   1. Vitamin D deficiency We will refill prescription Vitamin D for 1 month. Eh will follow-up for routine testing of Vitamin D, at least 2-3 times per year to avoid over-replacement.  - Vitamin D, Ergocalciferol, (DRISDOL) 1.25 MG (50000 UNIT) CAPS capsule; Take 1 capsule (50,000 Units total) by mouth every 7 (seven) days.  Dispense: 4 capsule; Refill: 0  2. Obstructive sleep apnea Intensive lifestyle modifications are the first line treatment for this issue. [name] was educated on obstructive sleep apnea's relationship to weight and metabolism, and she was encouraged to use her CPAP regularly. She will continue to work  on diet, exercise and weight loss efforts. We will continue to monitor. Orders and follow up as documented in patient record.   3. At risk for impaired metabolic function [name] was given approximately 15 minutes of impaired  metabolic function prevention counseling today. We discussed intensive lifestyle modifications today with an emphasis on specific nutrition and exercise instructions and strategies.   Repetitive spaced learning was employed today to elicit superior memory formation and behavioral change.  4. Obesity with current BMI 32.0 [name] is currently in the action stage of change. As such, her goal is to continue with weight loss efforts. She has agreed to the Category 2 Plan.   Behavioral modification strategies: meal planning and cooking strategies and dealing with family or coworker sabotage.  [name] has agreed to follow-up with our clinic in 3 to 4 weeks. She was informed of the importance of frequent follow-up visits to maximize her success with intensive lifestyle modifications for her multiple health conditions.   Objective:   Blood pressure 129/66, pulse (!) 57, temperature 98.1 F (36.7 C), height 5\' 4"  (1.626 m), weight 186 lb (84.4 kg), SpO2 99 %. Body mass index is 31.93 kg/m.  General: Cooperative, alert, well developed, in no acute distress. HEENT: Conjunctivae and lids unremarkable. Cardiovascular: Regular rhythm.  Lungs: Normal work of breathing. Neurologic: No focal deficits.   Lab Results  Component Value Date   CREATININE 0.80 02/16/2021   BUN 13 02/16/2021   NA 145 (H) 02/16/2021   K 4.2 02/16/2021   CL 103 02/16/2021   CO2 26  02/16/2021   Lab Results  Component Value Date   ALT 24 02/16/2021   AST 15 02/16/2021   ALKPHOS 70 02/16/2021   BILITOT 1.3 (H) 02/16/2021   Lab Results  Component Value Date   HGBA1C 5.6 02/16/2021   HGBA1C 6.0 (H) 07/20/2020   Lab Results  Component Value Date   INSULIN 5.9 02/16/2021   INSULIN 17.8  07/20/2020   Lab Results  Component Value Date   TSH 1.680 11/02/2020   Lab Results  Component Value Date   CHOL 144 02/16/2021   HDL 43 02/16/2021   LDLCALC 89 02/16/2021   TRIG 58 02/16/2021   CHOLHDL 3.3 12/23/2018   Lab Results  Component Value Date   VD25OH 63.7 02/16/2021   VD25OH 33.6 07/20/2020   Lab Results  Component Value Date   WBC 7.8 07/20/2020   HGB 13.3 07/20/2020   HCT 39.7 07/20/2020   MCV 91 07/20/2020   PLT 497 (H) 04/18/2019   Lab Results  Component Value Date   FERRITIN 1,561 (H) 04/13/2019   Attestation Statements:   Reviewed by clinician on day of visit: allergies, medications, problem list, medical history, surgical history, family history, social history, and previous encounter notes.   I, Trixie Dredge, am acting as transcriptionist for Dennard Nip, MD.  I have reviewed the above documentation for accuracy and completeness, and I agree with the above. -  Dennard Nip, MD

## 2021-04-06 ENCOUNTER — Encounter: Payer: Self-pay | Admitting: Orthopaedic Surgery

## 2021-04-06 ENCOUNTER — Other Ambulatory Visit: Payer: Self-pay

## 2021-04-06 ENCOUNTER — Ambulatory Visit (INDEPENDENT_AMBULATORY_CARE_PROVIDER_SITE_OTHER): Payer: 59 | Admitting: Physician Assistant

## 2021-04-06 ENCOUNTER — Encounter (HOSPITAL_COMMUNITY): Payer: 59

## 2021-04-06 VITALS — Ht 64.0 in | Wt 186.0 lb

## 2021-04-06 DIAGNOSIS — M79661 Pain in right lower leg: Secondary | ICD-10-CM

## 2021-04-06 NOTE — Progress Notes (Signed)
Post-Op Visit Note   Patient: [name]           Date of Birth: [birth year]           MRN: [id number] Visit Date: 04/06/2021 PCP: Maurice Small, MD   Assessment & Plan:  Chief Complaint:  Chief Complaint  Patient presents with   Right Knee - Pain    03/08/2021 partial menisectomy of right knee   Visit Diagnoses:  1. Pain in right lower leg     Plan: Patient is a pleasant 61 year old female comes in today 4 weeks status post right knee partial lateral meniscectomy 03/08/2021.  As noted during operative intervention she had grade 2 changes to the lateral compartment.  She has been doing well since surgery in regards to the actual knee, but continues to complain of pain and swelling to the calf, popliteal fossa and back of the thigh.  She does note that she underwent ultrasound to the right lower extremity several months ago which was negative for DVT.  No previous history of DVT or PE.  No chest pain or shortness of breath.  Examination of the right knee reveals a trace effusion.  Full healed surgical scars without complication.  Calf is soft and nontender.  She does have tenderness to the popliteal fossa.  At this point, I would like to repeat the ultrasound to rule out DVT.  If this is negative, she will follow-up with [place] over the next couple weeks for probable cortisone injection.  In the meantime, I have advised her to avoid activities and to ice and elevate as needed.  Follow-Up Instructions: Return in about 2 weeks (around 04/20/2021).   Orders:  Orders Placed This Encounter  Procedures   VAS [place] LOWER EXTREMITY VENOUS (DVT)   No orders of the defined types were placed in this encounter.   Imaging: No new imaging  PMFS History: Patient Active Problem List   Diagnosis Date Noted   Chondral loose body of right knee joint    Hypoxemia 02/28/2021   Acute lateral meniscus tear of right knee 02/21/2021   Baker's cyst of knee, right 11/30/2020   Pneumonia due to  COVID-19 virus 04/13/2019   Hypokalemia 04/13/2019   Primary osteoarthritis of first carpometacarpal joint of left hand 05/03/2015   Radial styloid tenosynovitis 05/03/2015   PVC (premature ventricular contraction) 02/05/2014   Benign essential HTN 02/05/2014   Dyslipidemia 02/05/2014   Past Medical History:  Diagnosis Date   Arthritis    low back   Back pain    Baker's cyst of knee, right    Constipation    GERD (gastroesophageal reflux disease)    Heart burn    Hyperlipidemia    Hypertension    IBS (irritable bowel syndrome)    Joint pain    Lactose intolerance    Muscle pain    Other fatigue    Palpitations    Pre-diabetes    PVC (premature ventricular contraction)    Ringing in ear    Sleep apnea    SOB (shortness of breath) on exertion    Vitamin D deficiency     Family History  Problem Relation Age of Onset   Sudden death Mother    Hypertension Father    Stroke Father    Cancer Father    Alcoholism Father    Hyperlipidemia Sister    Hyperlipidemia Brother     Past Surgical History:  Procedure Laterality Date   APPENDECTOMY  carpal tunn     CESAREAN SECTION     KNEE ARTHROSCOPY WITH LATERAL MENISECTOMY Right 03/08/2021   Procedure: RIGHT KNEE PARTIAL LATERAL MENISCECTOMY;  Surgeon: Leandrew Koyanagi, MD;  Location: [place];  Service: Orthopedics;  Laterality: Right;   NEUROMA SURGERY     SEPTOPLASTY     TONSILLECTOMY     TOTAL ABDOMINAL HYSTERECTOMY W/ BILATERAL SALPINGOOPHORECTOMY     TUBAL LIGATION     Social History   Occupational History   Occupation: Stay at home spouse  Tobacco Use   Smoking status: Never   Smokeless tobacco: Never  Vaping Use   Vaping Use: Never used  Substance and Sexual Activity   Alcohol use: Not Currently   Drug use: Never   Sexual activity: Not on file

## 2021-04-07 ENCOUNTER — Telehealth: Payer: Self-pay

## 2021-04-07 ENCOUNTER — Ambulatory Visit (HOSPITAL_COMMUNITY)
Admission: RE | Admit: 2021-04-07 | Discharge: 2021-04-07 | Disposition: A | Discharge status: Home / Self Care | Payer: 59 | Source: Ambulatory Visit | Attending: Orthopaedic Surgery | Admitting: Orthopaedic Surgery

## 2021-04-07 DIAGNOSIS — M79661 Pain in right lower leg: Secondary | ICD-10-CM | POA: Diagnosis present

## 2021-04-07 NOTE — Telephone Encounter (Signed)
Vas lab called to advise that pt is negative for DVT RLE

## 2021-04-07 NOTE — Progress Notes (Signed)
Right lower extremity venous duplex completed. Refer to "CV Proc" under chart review to view preliminary results.  04/07/2021 2:38 PM Eula Fried., MHA, RVT, RDCS, RDMS

## 2021-04-10 NOTE — Telephone Encounter (Signed)
thanks

## 2021-04-19 ENCOUNTER — Ambulatory Visit (INDEPENDENT_AMBULATORY_CARE_PROVIDER_SITE_OTHER): Payer: 59 | Admitting: Orthopaedic Surgery

## 2021-04-19 ENCOUNTER — Encounter: Payer: Self-pay | Admitting: Orthopaedic Surgery

## 2021-04-19 ENCOUNTER — Other Ambulatory Visit: Payer: Self-pay

## 2021-04-19 DIAGNOSIS — Z9889 Other specified postprocedural states: Secondary | ICD-10-CM

## 2021-04-19 DIAGNOSIS — S83281A Other tear of lateral meniscus, current injury, right knee, initial encounter: Secondary | ICD-10-CM

## 2021-04-19 NOTE — Progress Notes (Signed)
Post-Op Visit Note   Patient: [name]           Date of Birth: 10-12-1960           MRN: [id number] Visit Date: 04/19/2021 PCP: Maurice Small, MD   Assessment & Plan:  Chief Complaint:  Chief Complaint  Patient presents with   Right Knee - Pain   Visit Diagnoses:  1. S/P right knee arthroscopy   2. Acute lateral meniscus tear of right knee, initial encounter     Plan: [name] comes in today for 6-week follow-up status post right knee scope.  Doppler was negative for DVT.  She has a little pulling sensation in the thigh and some calf pain at times but overall she has returned back to normal activities.  Doing home exercises and using Voltaren gel at times.  Examination of the right knee shows fully healed surgical scars.  She has essentially normal range of motion and appropriate strength and quad activation.  Normal gait and ambulation.  At this point [name] has recovered from the surgery well.  She is released to activity as tolerated.  Follow-up as needed.  Follow-Up Instructions: Return if symptoms worsen or fail to improve.   Orders:  No orders of the defined types were placed in this encounter.  No orders of the defined types were placed in this encounter.   Imaging: No results found.  PMFS History: Patient Active Problem List   Diagnosis Date Noted   Chondral loose body of right knee joint    Hypoxemia 02/28/2021   Acute lateral meniscus tear of right knee 02/21/2021   Baker's cyst of knee, right 11/30/2020   Pneumonia due to COVID-19 virus 04/13/2019   Hypokalemia 04/13/2019   Primary osteoarthritis of first carpometacarpal joint of left hand 05/03/2015   Radial styloid tenosynovitis 05/03/2015   PVC (premature ventricular contraction) 02/05/2014   Benign essential HTN 02/05/2014   Dyslipidemia 02/05/2014   Past Medical History:  Diagnosis Date   Arthritis    low back   Back pain    Baker's cyst of knee, right    Constipation    GERD  (gastroesophageal reflux disease)    Heart burn    Hyperlipidemia    Hypertension    IBS (irritable bowel syndrome)    Joint pain    Lactose intolerance    Muscle pain    Other fatigue    Palpitations    Pre-diabetes    PVC (premature ventricular contraction)    Ringing in ear    Sleep apnea    SOB (shortness of breath) on exertion    Vitamin D deficiency     Family History  Problem Relation Age of Onset   Sudden death Mother    Hypertension Father    Stroke Father    Cancer Father    Alcoholism Father    Hyperlipidemia Sister    Hyperlipidemia Brother     Past Surgical History:  Procedure Laterality Date   APPENDECTOMY     carpal tunn     CESAREAN SECTION     KNEE ARTHROSCOPY WITH LATERAL MENISECTOMY Right 03/08/2021   Procedure: RIGHT KNEE PARTIAL LATERAL MENISCECTOMY;  Surgeon: Leandrew Koyanagi, MD;  Location: [facility];  Service: Orthopedics;  Laterality: Right;   NEUROMA SURGERY     SEPTOPLASTY     TONSILLECTOMY     TOTAL ABDOMINAL HYSTERECTOMY W/ BILATERAL SALPINGOOPHORECTOMY     TUBAL LIGATION     Social  History   Occupational History   Occupation: Stay at home spouse  Tobacco Use   Smoking status: Never   Smokeless tobacco: Never  Vaping Use   Vaping Use: Never used  Substance and Sexual Activity   Alcohol use: Not Currently   Drug use: Never   Sexual activity: Not on file

## 2021-04-26 ENCOUNTER — Encounter (INDEPENDENT_AMBULATORY_CARE_PROVIDER_SITE_OTHER): Payer: Self-pay | Admitting: Family Medicine

## 2021-04-26 ENCOUNTER — Ambulatory Visit (INDEPENDENT_AMBULATORY_CARE_PROVIDER_SITE_OTHER): Payer: 59 | Admitting: Family Medicine

## 2021-04-26 ENCOUNTER — Other Ambulatory Visit: Payer: Self-pay

## 2021-04-26 VITALS — BP 148/67 | HR 52 | Temp 97.9°F | Ht 64.0 in | Wt 187.0 lb

## 2021-04-26 DIAGNOSIS — I10 Essential (primary) hypertension: Secondary | ICD-10-CM | POA: Diagnosis not present

## 2021-04-26 DIAGNOSIS — Z6838 Body mass index (BMI) 38.0-38.9, adult: Secondary | ICD-10-CM

## 2021-04-26 DIAGNOSIS — R7303 Prediabetes: Secondary | ICD-10-CM

## 2021-04-26 DIAGNOSIS — G4733 Obstructive sleep apnea (adult) (pediatric): Secondary | ICD-10-CM | POA: Diagnosis not present

## 2021-04-26 DIAGNOSIS — Z9189 Other specified personal risk factors, not elsewhere classified: Secondary | ICD-10-CM

## 2021-04-26 DIAGNOSIS — E559 Vitamin D deficiency, unspecified: Secondary | ICD-10-CM | POA: Diagnosis not present

## 2021-04-26 DIAGNOSIS — E669 Obesity, unspecified: Secondary | ICD-10-CM

## 2021-04-26 DIAGNOSIS — Z6832 Body mass index (BMI) 32.0-32.9, adult: Secondary | ICD-10-CM

## 2021-04-26 MED ORDER — VITAMIN D (ERGOCALCIFEROL) 1.25 MG (50000 UNIT) PO CAPS: 50000.0000 [IU] | ORAL_CAPSULE | ORAL | 0 refills | Status: DC

## 2021-04-27 NOTE — Progress Notes (Signed)
Chief Complaint:   OBESITY [name] is here to discuss her progress with her obesity treatment plan along with follow-up of her obesity related diagnoses. [name] is on the Category 2 Plan and states she is following her eating plan approximately 85-90% of the time. [name] states she is walking and doing cardio for 10-15 minutes 3 times per week.  Today's visit was #: 15 Starting weight: 228 lbs Starting date: 07/20/2020 Today's weight: 187 lbs Today's date: 04/26/2021 Total lbs lost to date: 41 Total lbs lost since last in-office visit: 0  Interim History: [name] notes challenges since her last visit. She is struggling with dinner and meeting her protein goals. She is 7 weeks status post right knee arthroscope. She is walking 3 days per week. She is struggling with baker cyst on the right and she notes some throbbing with walking. She is caring for her husband who had a stroke. She is struggling with meal planning due to trying to get her husband to eat.  Subjective:   1. Vitamin D deficiency [name] is taking Vit D 50,000 IU weekly. She denies side effects.  2. Obstructive sleep apnea [name] started CPAP 2 weeks ago. She is struggling with her mask leaking.  3. Pre-diabetes Elly's last A1c was 5.6 on 02/16/2021, it looks better and has decreased from 6.0.  4. Essential hypertension [name]'s blood pressure is elevated today. She is taking hydrochlorothiazide 25 mg and Toprol XL 50 mg. She denies side effects. She reports right calf pain due to baker's cyst. She saw Ortho last on 04/19/2021.  5. At risk for impaired metabolic function [name] is at increased risk for impaired metabolic function due to decreased protein intake.  Assessment/Plan:   1. Vitamin D deficiency Low Vitamin D level contributes to fatigue and are associated with obesity, breast, and colon cancer. We will refill prescription Vitamin D for 1 month. [name] will follow-up for routine testing of Vitamin D, at least  2-3 times per year to avoid over-replacement.  - Vitamin D, Ergocalciferol, (DRISDOL) 1.25 MG (50000 UNIT) CAPS capsule; Take 1 capsule (50,000 Units total) by mouth every 7 (seven) days.  Dispense: 4 capsule; Refill: 0  2. Obstructive sleep apnea Intensive lifestyle modifications are the first line treatment for this issue. We discussed several lifestyle modifications today. [name] was encouraged to continue her CPAP nightly. She will continue to work on diet, exercise and weight loss efforts. We will continue to monitor. Orders and follow up as documented in patient record.   3. Pre-diabetes [name] will continue working on dietary changes, exercise, and weight loss to help decrease the risk of diabetes.   4. Essential hypertension [name] will continue to follow up with her primary care physician. She will continue her medications as directed to improve her blood pressure control.  5. At risk for impaired metabolic function [name] was given approximately 15 minutes of impaired  metabolic function prevention counseling today. We discussed intensive lifestyle modifications today with an emphasis on specific nutrition and exercise instructions and strategies.   Repetitive spaced learning was employed today to elicit superior memory formation and behavioral change.  6. Obesity with current BMI 32.2 [name] is currently in the action stage of change. As such, her goal is to continue with weight loss efforts. She has agreed to the Category 2 Plan and keeping a food journal and adhering to recommended goals of 400-550 calories and 40 grams of protein at supper daily.   Soup recipes were given.  Exercise  goals: As is.  Behavioral modification strategies: increasing lean protein intake, increasing water intake, no skipping meals, and meal planning and cooking strategies.  [name] has agreed to follow-up with our clinic in 3 to 4 weeks. She was informed of the importance of frequent follow-up visits  to maximize her success with intensive lifestyle modifications for her multiple health conditions.   Objective:   Blood pressure (!) 148/67, pulse (!) 52, temperature 97.9 F (36.6 C), height 5\' 4"  (1.626 m), weight 187 lb (84.8 kg), SpO2 99 %. Body mass index is 32.1 kg/m.  General: Cooperative, alert, well developed, in no acute distress. HEENT: Conjunctivae and lids unremarkable. Cardiovascular: Regular rhythm.  Lungs: Normal work of breathing. Neurologic: No focal deficits.   Lab Results  Component Value Date   CREATININE 0.80 02/16/2021   BUN 13 02/16/2021   NA 145 (H) 02/16/2021   K 4.2 02/16/2021   CL 103 02/16/2021   CO2 26 02/16/2021   Lab Results  Component Value Date   ALT 24 02/16/2021   AST 15 02/16/2021   ALKPHOS 70 02/16/2021   BILITOT 1.3 (H) 02/16/2021   Lab Results  Component Value Date   HGBA1C 5.6 02/16/2021   HGBA1C 6.0 (H) 07/20/2020   Lab Results  Component Value Date   INSULIN 5.9 02/16/2021   INSULIN 17.8 07/20/2020   Lab Results  Component Value Date   TSH 1.680 11/02/2020   Lab Results  Component Value Date   CHOL 144 02/16/2021   HDL 43 02/16/2021   LDLCALC 89 02/16/2021   TRIG 58 02/16/2021   CHOLHDL 3.3 12/23/2018   Lab Results  Component Value Date   VD25OH 63.7 02/16/2021   VD25OH 33.6 07/20/2020   Lab Results  Component Value Date   WBC 7.8 07/20/2020   HGB 13.3 07/20/2020   HCT 39.7 07/20/2020   MCV 91 07/20/2020   PLT 497 (H) 04/18/2019   Lab Results  Component Value Date   FERRITIN 1,561 (H) 04/13/2019   Attestation Statements:   Reviewed by clinician on day of visit: allergies, medications, problem list, medical history, surgical history, family history, social history, and previous encounter notes.   I, Trixie Dredge, am acting as transcriptionist for Dennard Nip, MD.  I have reviewed the above documentation for accuracy and completeness, and I agree with the above. -  Dennard Nip, MD

## 2021-05-05 ENCOUNTER — Other Ambulatory Visit: Payer: Self-pay | Admitting: Internal Medicine

## 2021-05-10 ENCOUNTER — Ambulatory Visit (INDEPENDENT_AMBULATORY_CARE_PROVIDER_SITE_OTHER): Payer: 59 | Admitting: Family Medicine

## 2021-05-10 ENCOUNTER — Ambulatory Visit: Payer: Self-pay

## 2021-05-10 ENCOUNTER — Other Ambulatory Visit: Payer: Self-pay

## 2021-05-10 VITALS — BP 122/78 | HR 59 | Ht 64.0 in | Wt 190.0 lb

## 2021-05-10 DIAGNOSIS — M7121 Synovial cyst of popliteal space [Baker], right knee: Secondary | ICD-10-CM

## 2021-05-10 DIAGNOSIS — M25561 Pain in right knee: Secondary | ICD-10-CM

## 2021-05-10 NOTE — Patient Instructions (Addendum)
Thank you for coming in today.   You received an injection today. Seek immediate medical attention if the joint becomes red, extremely painful, or is oozing fluid.   Continue to use compression during and after exercise.  Recheck back as needed

## 2021-05-10 NOTE — Progress Notes (Signed)
I, Christoper Fabian, LAT, ATC, am serving as scribe for Dr. Clementeen Graham.  [name] is a 61 y.o. female who presents to [facility] at [facility] today for f/u of R knee pain and swelling.  She was last seen by Dr. Denyse Amass on 01/30/21 for f/u and was ultimately referred to see Dr. Roda Shutters after an MRI revealed a horizontal, posterior horn lateral meniscus tear.  She had a R knee arthroscopic lateral menisectomy on 03/08/21 and most recently saw Dr. Roda Shutters for f/u on 04/19/21.  Today, pt reports the Baker cyst has been bothering her and was present when seen prior by Dr. Roda Shutters. Pt locates pain to the posterior aspect of her R knee w/ swelling present. Dr. Dalbert Garnet at [facility] recommended she f/u with Dr. Denyse Amass.  Diagnostic testing: R knee MRI- 02/11/21, R knee XR- 12/19/20; R LE venous doppler US- 11/25/20  Pertinent review of systems: No fevers or chills  Relevant historical information: Lateral partial meniscectomy about 2 months ago   Exam:  BP 122/78    Pulse (!) 59    Ht 5\' 4"  (1.626 m)    Wt 190 lb (86.2 kg)    SpO2 97%    BMI 32.61 kg/m  General: Well Developed, well nourished, and in no acute distress.   MSK: Right knee mature portal scar anterior knee.  No joint effusion.  Palpable swelling posterior medial knee.    Lab and Radiology Results  Procedure: Real-time Ultrasound Guided aspiration and injection of right knee Baker's cyst Device: Philips Affiniti 50G Images permanently stored and available for review in PACS Ultrasound evaluation prior to injection reveals a moderate Baker's cyst right knee. Verbal informed consent obtained.  Discussed risks and benefits of procedure. Warned about infection bleeding damage to structures skin hypopigmentation and fat atrophy among others. Patient expresses understanding and agreement Time-out conducted.   Noted no overlying erythema, induration, or other signs of local infection.   Skin prepped in a sterile  fashion.   Local anesthesia: Topical Ethyl chloride.   With sterile technique and under real time ultrasound guidance: 3 mL of lidocaine injected into subcutaneous tissue and along the planned tract for aspiration.. Skin was again sterilized with isopropyl alcohol and an 18-gauge needle was used to access the Baker's cyst. 10 mL of clear yellow fluid was aspirated and syringe was exchanged. 40 mg of Kenalog and 2 mL of Marcaine was injected into the Baker's cyst. Baker's cyst was decompressed following this procedure.  Completed without difficulty   Pain immediately resolved suggesting accurate placement of the medication.   Advised to call if fevers/chills, erythema, induration, drainage, or persistent bleeding.   Images permanently stored and available for review in the ultrasound unit.  Impression: Technically successful ultrasound guided injection.         Assessment and Plan: 61 y.o. female with right knee pain and discomfort especially at the posterior medial knee secondary to Baker's cyst.  She successfully had significant symptom resolution from her mechanical symptoms after her meniscus surgery in late December.  However she has persistent discomfort in the posterior medial knee thought to be due to her Baker's cyst.  Now 2 months after surgery she still uncomfortable.  I think it is reasonable to proceed with trial of aspiration and injection of the Baker's cyst.  I warned her that this is sometimes successful and sometimes not successful.  Additionally recommend continued compression especially with activity.  Recheck back as needed.  PDMP not reviewed this encounter. Orders Placed This Encounter  Procedures   [place] LIMITED JOINT SPACE STRUCTURES LOW RIGHT(NO LINKED CHARGES)    Standing Status:   Future    Number of Occurrences:   1    Standing Expiration Date:   11/07/2021    Order Specific Question:   Reason for Exam (SYMPTOM  OR DIAGNOSIS REQUIRED)    Answer:   right knee  pain    Order Specific Question:   Preferred imaging location?    Answer:   [facility]   No orders of the defined types were placed in this encounter.    Discussed warning signs or symptoms. Please see discharge instructions. Patient expresses understanding.   The above documentation has been reviewed and is accurate and complete Clementeen Graham, M.D.

## 2021-05-29 ENCOUNTER — Encounter: Payer: Self-pay | Admitting: Internal Medicine

## 2021-05-29 ENCOUNTER — Ambulatory Visit (INDEPENDENT_AMBULATORY_CARE_PROVIDER_SITE_OTHER): Payer: 59 | Admitting: Family Medicine

## 2021-05-29 ENCOUNTER — Encounter (INDEPENDENT_AMBULATORY_CARE_PROVIDER_SITE_OTHER): Payer: Self-pay | Admitting: Family Medicine

## 2021-05-29 ENCOUNTER — Other Ambulatory Visit: Payer: Self-pay

## 2021-05-29 VITALS — BP 137/67 | HR 59 | Temp 97.8°F | Ht 64.0 in | Wt 183.0 lb

## 2021-05-29 DIAGNOSIS — E559 Vitamin D deficiency, unspecified: Secondary | ICD-10-CM

## 2021-05-29 DIAGNOSIS — Z9189 Other specified personal risk factors, not elsewhere classified: Secondary | ICD-10-CM

## 2021-05-29 DIAGNOSIS — Z6838 Body mass index (BMI) 38.0-38.9, adult: Secondary | ICD-10-CM

## 2021-05-29 DIAGNOSIS — E669 Obesity, unspecified: Secondary | ICD-10-CM

## 2021-05-29 DIAGNOSIS — G4733 Obstructive sleep apnea (adult) (pediatric): Secondary | ICD-10-CM

## 2021-05-29 DIAGNOSIS — I10 Essential (primary) hypertension: Secondary | ICD-10-CM | POA: Diagnosis not present

## 2021-05-29 DIAGNOSIS — Z6831 Body mass index (BMI) 31.0-31.9, adult: Secondary | ICD-10-CM

## 2021-05-29 DIAGNOSIS — R002 Palpitations: Secondary | ICD-10-CM

## 2021-05-29 MED ORDER — VITAMIN D (ERGOCALCIFEROL) 1.25 MG (50000 UNIT) PO CAPS: 50000.0000 [IU] | ORAL_CAPSULE | ORAL | 0 refills | Status: DC

## 2021-05-30 MED ORDER — CHLORTHALIDONE 25 MG PO TABS: 25.0000 mg | ORAL_TABLET | Freq: Every day | ORAL | 3 refills | Status: DC

## 2021-05-31 NOTE — Progress Notes (Signed)
? ? ? ?Chief Complaint:  ? ?OBESITY ?[name] is here to discuss her progress with her obesity treatment plan along with follow-up of her obesity related diagnoses. [name] is on the Category 2 Plan and keeping a food journal and adhering to recommended goals of 400-550 calories and 40 grams of protein at supper daily and states she is following her eating plan approximately 90% of the time. [name] states she is walking for 30 minutes 4 times per week. ? ?Today's visit was #: 16 ?Starting weight: 228 lbs ?Starting date: 07/20/2020 ?Today's weight: 183 lbs ?Today's date: 05/29/2021 ?Total lbs lost to date: 70 ?Total lbs lost since last in-office visit: 4 ? ?Interim History: [name] is doing well with weight loss. She denies hunger or cravings. She is not skipping meals and she is meeting her protein goals. ? ?Subjective:  ? ?1. Vitamin D deficiency ?[name] is taking Vit D, and she denies nausea, vomiting, or muscle weakness. ? ?2. Essential hypertension ?[name] ia taking Toprol XL 500 mg and hydrochlorothiazide 25 mg. She reports pulse running between 40-70. She discussed with Cardiology concerning this on 11/30/2020. She denies chest pain, palpitations, racing heart, or shortness of breath. She reports fatigue with activity. ? ?3. Obstructive sleep apnea ?[name] is wearing her CPAP nightly without any problems. ? ?4. At risk for heart disease ?[name] is at higher than average risk for cardiovascular disease due to obesity. ? ?Assessment/Plan:  ? ?1. Vitamin D deficiency ?[name] will continue prescription Vitamin D 50,000 IU weekly, and we will refill for 1 month. ? ?- Vitamin D, Ergocalciferol, (DRISDOL) 1.25 MG (50000 UNIT) CAPS capsule; Take 1 capsule (50,000 Units total) by mouth every 7 (seven) days.  Dispense: 4 capsule; Refill: 0 ? ?2. Essential hypertension ?[name] is to continue Cardiology today to discuss hypertension and lower heart rate. Last EKG was 11/02/2020. ? ?3. Obstructive sleep apnea ?[name] will continue  her CPAP nightly, and keep her follow up appointment with Dr. Radford Pax. ? ?4. At risk for heart disease ?[name] was given approximately 15 minutes of coronary artery disease prevention counseling today. She is 61 y.o. female and has risk factors for heart disease including obesity. We discussed intensive lifestyle modifications today with an emphasis on specific weight loss instructions and strategies. ? ?Repetitive spaced learning was employed today to elicit superior memory formation and behavioral change.  ? ?5. Obesity with current BMI 31.5 ?[name] is currently in the action stage of change. As such, her goal is to continue with weight loss efforts. She has agreed to the Category 2 Plan.  ? ?Exercise goals: As is. ? ?Behavioral modification strategies: increasing lean protein intake, increasing water intake, no skipping meals, and meal planning and cooking strategies. ? ?Monday has agreed to follow-up with our clinic in 4 weeks. She was informed of the importance of frequent follow-up visits to maximize her success with intensive lifestyle modifications for her multiple health conditions.  ? ?Objective:  ? ?Blood pressure 137/67, pulse (!) 59, temperature 97.8 ?F (36.6 ?C), height 5\' 4"  (1.626 m), weight 183 lb (83 kg), SpO2 97 %. ?Body mass index is 31.41 kg/m?. ? ?General: Cooperative, alert, well developed, in no acute distress. ?HEENT: Conjunctivae and lids unremarkable. ?Cardiovascular: Regular rhythm.  ?Lungs: Normal work of breathing. ?Neurologic: No focal deficits.  ? ?Lab Results  ?Component Value Date  ? CREATININE 0.80 02/16/2021  ? BUN 13 02/16/2021  ? NA 145 (H) 02/16/2021  ? K 4.2 02/16/2021  ? CL 103 02/16/2021  ? CO2  26 02/16/2021  ? ?Lab Results  ?Component Value Date  ? ALT 24 02/16/2021  ? AST 15 02/16/2021  ? ALKPHOS 70 02/16/2021  ? BILITOT 1.3 (H) 02/16/2021  ? ?Lab Results  ?Component Value Date  ? HGBA1C 5.6 02/16/2021  ? HGBA1C 6.0 (H) 07/20/2020  ? ?Lab Results  ?Component Value Date  ?  INSULIN 5.9 02/16/2021  ? INSULIN 17.8 07/20/2020  ? ?Lab Results  ?Component Value Date  ? TSH 1.680 11/02/2020  ? ?Lab Results  ?Component Value Date  ? CHOL 144 02/16/2021  ? HDL 43 02/16/2021  ? [place] 89 02/16/2021  ? TRIG 58 02/16/2021  ? CHOLHDL 3.3 12/23/2018  ? ?Lab Results  ?Component Value Date  ? VD25OH 63.7 02/16/2021  ? VD25OH 33.6 07/20/2020  ? ?Lab Results  ?Component Value Date  ? WBC 7.8 07/20/2020  ? HGB 13.3 07/20/2020  ? HCT 39.7 07/20/2020  ? MCV 91 07/20/2020  ? PLT 497 (H) 04/18/2019  ? ?Lab Results  ?Component Value Date  ? FERRITIN 1,561 (H) 04/13/2019  ? ?Attestation Statements:  ? ?Reviewed by clinician on day of visit: allergies, medications, problem list, medical history, surgical history, family history, social history, and previous encounter notes. ? ? ?I, Trixie Dredge, am acting as transcriptionist for Dennard Nip, MD. ? ?I have reviewed the above documentation for accuracy and completeness, and I agree with the above. -  Dennard Nip, MD ? ? ?

## 2021-06-07 ENCOUNTER — Other Ambulatory Visit: Payer: Self-pay

## 2021-06-07 ENCOUNTER — Other Ambulatory Visit: Payer: 59 | Admitting: *Deleted

## 2021-06-07 DIAGNOSIS — R002 Palpitations: Secondary | ICD-10-CM

## 2021-06-07 DIAGNOSIS — I10 Essential (primary) hypertension: Secondary | ICD-10-CM

## 2021-06-07 LAB — BASIC METABOLIC PANEL
BUN/Creatinine Ratio: 19 (ref 12–28)
BUN: 17 mg/dL (ref 8–27)
CO2: 30 mmol/L — ABNORMAL HIGH (ref 20–29)
Calcium: 9.8 mg/dL (ref 8.7–10.3)
Chloride: 102 mmol/L (ref 96–106)
Creatinine, Ser: 0.88 mg/dL (ref 0.57–1.00)
Glucose: 89 mg/dL (ref 70–99)
Potassium: 3.3 mmol/L — ABNORMAL LOW (ref 3.5–5.2)
Sodium: 142 mmol/L (ref 134–144)
eGFR: 75 mL/min/{1.73_m2} (ref >59)

## 2021-06-08 ENCOUNTER — Telehealth: Payer: Self-pay

## 2021-06-08 DIAGNOSIS — I10 Essential (primary) hypertension: Secondary | ICD-10-CM

## 2021-06-08 MED ORDER — POTASSIUM CHLORIDE CRYS ER 20 MEQ PO TBCR: 20.0000 meq | EXTENDED_RELEASE_TABLET | Freq: Every day | ORAL | 3 refills | Status: DC

## 2021-06-08 NOTE — Telephone Encounter (Signed)
Spoke with patient and advised her on increasing Kdur per Dr. Mayford Knife. Lab work has been scheduled.  ?

## 2021-06-08 NOTE — Telephone Encounter (Signed)
-----   Message from Quintella Reichert, MD sent at 06/08/2021  9:39 AM EDT ----- ?Please verify if patient has missed any doses of her potassium. ?

## 2021-06-08 NOTE — Telephone Encounter (Signed)
The patient has been notified of the result and verbalized understanding.  All questions (if any) were answered. ?Amy Majors, RN 06/08/2021 10:13 AM  ? ?Patient states that her PCP reduced her potassium supplement from 20 meq daily to 10 meq daily about 6 months ago. She has not missed any doses.  ?

## 2021-06-14 ENCOUNTER — Encounter: Payer: Self-pay | Admitting: Cardiology

## 2021-06-14 ENCOUNTER — Telehealth (INDEPENDENT_AMBULATORY_CARE_PROVIDER_SITE_OTHER): Payer: 59 | Admitting: Cardiology

## 2021-06-14 ENCOUNTER — Other Ambulatory Visit: Payer: Self-pay

## 2021-06-14 VITALS — BP 142/70 | HR 53 | Ht 64.0 in | Wt 184.0 lb

## 2021-06-14 DIAGNOSIS — G4733 Obstructive sleep apnea (adult) (pediatric): Secondary | ICD-10-CM

## 2021-06-14 DIAGNOSIS — I10 Essential (primary) hypertension: Secondary | ICD-10-CM

## 2021-06-14 MED ORDER — ATORVASTATIN CALCIUM 40 MG PO TABS: 40.0000 mg | ORAL_TABLET | Freq: Every day | ORAL | 3 refills | Status: DC

## 2021-06-14 NOTE — Patient Instructions (Signed)

## 2021-06-14 NOTE — Progress Notes (Signed)
? ?Virtual Visit via Video Note  ? ?This visit type was conducted due to national recommendations for restrictions regarding the COVID-19 Pandemic (e.g. social distancing) in an effort to limit this patient's exposure and mitigate transmission in our community.  Due to her co-morbid illnesses, this patient is at least at moderate risk for complications without adequate follow up.  This format is felt to be most appropriate for this patient at this time.  All issues noted in this document were discussed and addressed.  A limited physical exam was performed with this format.  Please refer to the patient's chart for her consent to telehealth for [facility]. ? ? ?Date:  06/14/2021  ? ?ID:  [name], DOB [birth year], MRN [id number] ?The patient was identified using 2 identifiers. ? ?Patient Location: Home ?Provider Location: Home Office ? ? ?PCP:  Maurice Small, MD ?  ?[facility] Providers ?Cardiologist:  Fransico Him, MD ?Electrophysiologist:  Cristopher Peru, MD    ? ?Evaluation Performed:  Follow-Up Visit ? ?Chief Complaint:  OSA ? ?History of Present Illness:   ? ?[name] is a 61 y.o. 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 This improved with reduction of HRT. She had a 2D echo in 2014 that showed normal LVEF at 65%, trivial AI and trivial MR with grade 1 DD. She is on medical therapy for her HTN and DLD with Toprol , HCTZ and simvastatin. Both chronic conditions are followed by her PCP. Dr. [name] Cookey Web.  She was seen by Dr. Lovena Le with EP in Sept for her PVCs and is on Flecainide and metoprolol for suppression.  ?  ?She had a home sleep study April 2021 showing mild OSA with an AHI of 12.7/hr and O2 sats as low as 76%.   When I saw her a year ago she had not followed up with her PAP titration.  She underwent overnight pulse oximetry showing persistent nocturnal hypoxemia and subsequently underwent CPAP titration to 13 cm H2O.  She is now  here for follow-up. ? ?She is here today for followup and is doing well.  She denies any chest pain or pressure, SOB, DOE, PND, orthopnea, LE edema, dizziness, palpitations or syncope. She is compliant with her meds and is tolerating meds with no SE.    ? ?She is doing well with her CPAP device and thinks that she has gotten used to it.  She tolerates the full face mask and feels the pressure is adequate.  Since going on CPAP she feels rested in the am and has no significant daytime sleepiness.  She denies any significant mouth or nasal dryness or nasal congestion.  She does not think that he snores.    ? ?The patient does not have symptoms concerning for COVID-19 infection (fever, chills, cough, or new shortness of breath).  ? ? ?Past Medical History:  ?Diagnosis Date  ? Arthritis   ? low back  ? Back pain   ? Baker's cyst of knee, right   ? Constipation   ? GERD (gastroesophageal reflux disease)   ? Heart burn   ? Hyperlipidemia   ? Hypertension   ? IBS (irritable bowel syndrome)   ? Joint pain   ? Lactose intolerance   ? Muscle pain   ? Other fatigue   ? Palpitations   ? Pre-diabetes   ? PVC (premature ventricular contraction)   ? Ringing in ear   ? Sleep apnea   ?  SOB (shortness of breath) on exertion   ? Vitamin D deficiency   ? ?Past Surgical History:  ?Procedure Laterality Date  ? APPENDECTOMY    ? carpal tunn    ? CESAREAN SECTION    ? KNEE ARTHROSCOPY WITH LATERAL MENISECTOMY Right 03/08/2021  ? Procedure: RIGHT KNEE PARTIAL LATERAL MENISCECTOMY;  Surgeon: Leandrew Koyanagi, MD;  Location: [facility];  Service: Orthopedics;  Laterality: Right;  ? NEUROMA SURGERY    ? SEPTOPLASTY    ? TONSILLECTOMY    ? TOTAL ABDOMINAL HYSTERECTOMY W/ BILATERAL SALPINGOOPHORECTOMY    ? TUBAL LIGATION    ?  ? ?Current Meds  ?Medication Sig  ? aspirin 81 MG chewable tablet Chew 1 tablet (81 mg total) by mouth daily.  ? atorvastatin (LIPITOR) 40 MG tablet Take 1 tablet (40 mg total) by mouth daily. Please make yearly  appt with Dr. Radford Pax for January 2023 for future refills. Thank you 1st attempt  ? azelastine (ASTELIN) 0.1 % nasal spray Place into both nostrils as needed. Use in each nostril as directed  ? carboxymethylcellulose (REFRESH PLUS) 0.5 % SOLN 1 drop daily as needed. 2 drops each eye  ? chlorthalidone (HYGROTON) 25 MG tablet Take 1 tablet (25 mg total) by mouth daily.  ? estradiol (VIVELLE-DOT) 0.05 MG/24HR patch Place 1 patch onto the skin 2 (two) times a week.   ? flecainide (TAMBOCOR) 150 MG tablet Take 1/2 (one-half) tablet by mouth twice daily  ? Ipratropium-Albuterol (COMBIVENT RESPIMAT) 20-100 MCG/ACT AERS respimat Inhale 1 puff into the lungs every 6 (six) hours.  ? metoprolol succinate (TOPROL-XL) 50 MG 24 hr tablet TAKE 1 TABLET BY MOUTH ONCE DAILY **TAKE  WITH  OR  IMMEDIATELY  FOLLOWING  A  MEAL  ? montelukast (SINGULAIR) 10 MG tablet Take 10 mg by mouth daily as needed.  ? potassium chloride SA (KLOR-CON M) 20 MEQ tablet Take 1 tablet (20 mEq total) by mouth daily.  ? Vitamin D, Ergocalciferol, (DRISDOL) 1.25 MG (50000 UNIT) CAPS capsule Take 1 capsule (50,000 Units total) by mouth every 7 (seven) days.  ?  ? ?Allergies:   Buprenorphine hcl, Morphine and related, Morphine sulfate, Other, and Short ragweed pollen ext  ? ?Social History  ? ?Tobacco Use  ? Smoking status: Never  ? Smokeless tobacco: Never  ?Vaping Use  ? Vaping Use: Never used  ?Substance Use Topics  ? Alcohol use: Not Currently  ? Drug use: Never  ?  ? ?Family Hx: ?The patient's family history includes Alcoholism in her father; Cancer in her father; Hyperlipidemia in her brother and sister; Hypertension in her father; Stroke in her father; Sudden death in her mother. ? ?ROS:   ?Please see the history of present illness.    ? ?All other systems reviewed and are negative. ? ? ?Prior CV studies:   ?The following studies were reviewed today: ? ?CPAP titration and PAP compliance download ? ?Labs/Other Tests and Data Reviewed:   ? ?EKG:  No ECG  reviewed. ? ?Recent Labs: ?07/20/2020: Hemoglobin 13.3 ?11/02/2020: Magnesium 1.9; TSH 1.680 ?02/16/2021: ALT 24 ?06/07/2021: BUN 17; Creatinine, Ser 0.88; Potassium 3.3; Sodium 142  ? ?Recent Lipid Panel ?Lab Results  ?Component Value Date/Time  ? CHOL 144 02/16/2021 07:51 AM  ? TRIG 58 02/16/2021 07:51 AM  ? HDL 43 02/16/2021 07:51 AM  ? CHOLHDL 3.3 12/23/2018 01:22 PM  ? LDLCALC 89 02/16/2021 07:51 AM  ? ? ?Wt Readings from Last 3 Encounters:  ?06/14/21 184 lb (83.5  kg)  ?05/29/21 183 lb (83 kg)  ?05/10/21 190 lb (86.2 kg)  ?  ? ?Risk Assessment/Calculations:   ?  ? ?    ?Objective:   ? ?Vital Signs:  BP (!) 142/70   Pulse (!) 53   Ht 5\' 4"  (1.626 m)   Wt 184 lb (83.5 kg)   SpO2 99%   BMI 31.58 kg/m?   ? ?VITAL SIGNS:  reviewed ?GEN:  no acute distress ?EYES:  sclerae anicteric, EOMI - Extraocular Movements Intact ?RESPIRATORY:  normal respiratory effort, symmetric expansion ?CARDIOVASCULAR:  no peripheral edema ?SKIN:  no rash, lesions or ulcers. ?MUSCULOSKELETAL:  no obvious deformities. ?NEURO:  alert and oriented x 3, no obvious focal deficit ?PSYCH:  normal affect ? ?ASSESSMENT & PLAN:   ? ?OSA - The patient is tolerating PAP therapy well without any problems. The PAP download performed by his DME was personally reviewed and interpreted by me today and showed an AHI of 0.5 /hr on 13 cm H2O with 77% compliance in using more than 4 hours nightly.  The patient has been using and benefiting from PAP use and will continue to benefit from therapy.  ? ? HTN ?-BP is controlled on exam ?-Continue prescription drug management with chlorthalidone 25 mg daily, Toprol-XL 50 mg daily with as needed refills ? ?HLD ?-LDL goal < 100 ?-LDL was 89 in 02/2021 ?-continue prescription drug management with Atorvastatin 40mg  daily with PRN refills ?   ? ?COVID-19 Education: ?The signs and symptoms of COVID-19 were discussed with the patient and how to seek care for testing (follow up with PCP or arrange E-visit).  The importance of  social distancing was discussed today. ? ?Time:   ?Today, I have spent 15 minutes with the patient with telehealth technology discussing the above problems.   ? ? ?Medication Adjustments/Labs and Tests O

## 2021-06-14 NOTE — Addendum Note (Signed)
Addended by: Theresia Majors on: 06/14/2021 10:51 AM ? ? Modules accepted: Orders ? ?

## 2021-06-16 ENCOUNTER — Other Ambulatory Visit: Payer: 59 | Admitting: *Deleted

## 2021-06-16 DIAGNOSIS — I10 Essential (primary) hypertension: Secondary | ICD-10-CM

## 2021-06-16 LAB — BASIC METABOLIC PANEL
BUN/Creatinine Ratio: 18 (ref 12–28)
BUN: 16 mg/dL (ref 8–27)
CO2: 29 mmol/L (ref 20–29)
Calcium: 9.5 mg/dL (ref 8.7–10.3)
Chloride: 100 mmol/L (ref 96–106)
Creatinine, Ser: 0.87 mg/dL (ref 0.57–1.00)
Glucose: 102 mg/dL — ABNORMAL HIGH (ref 70–99)
Potassium: 3.6 mmol/L (ref 3.5–5.2)
Sodium: 142 mmol/L (ref 134–144)
eGFR: 76 mL/min/{1.73_m2} (ref >59)

## 2021-06-27 ENCOUNTER — Ambulatory Visit (INDEPENDENT_AMBULATORY_CARE_PROVIDER_SITE_OTHER): Payer: 59 | Admitting: Family Medicine

## 2021-06-27 ENCOUNTER — Encounter (INDEPENDENT_AMBULATORY_CARE_PROVIDER_SITE_OTHER): Payer: Self-pay | Admitting: Family Medicine

## 2021-06-27 VITALS — BP 115/74 | HR 52 | Temp 98.1°F | Ht 64.0 in | Wt 184.0 lb

## 2021-06-27 DIAGNOSIS — Z6831 Body mass index (BMI) 31.0-31.9, adult: Secondary | ICD-10-CM | POA: Diagnosis not present

## 2021-06-27 DIAGNOSIS — I10 Essential (primary) hypertension: Secondary | ICD-10-CM | POA: Diagnosis not present

## 2021-06-27 DIAGNOSIS — E559 Vitamin D deficiency, unspecified: Secondary | ICD-10-CM

## 2021-06-27 DIAGNOSIS — E669 Obesity, unspecified: Secondary | ICD-10-CM

## 2021-06-27 MED ORDER — VITAMIN D (ERGOCALCIFEROL) 1.25 MG (50000 UNIT) PO CAPS: 50000.0000 [IU] | ORAL_CAPSULE | ORAL | 0 refills | Status: DC

## 2021-06-28 NOTE — Progress Notes (Signed)
? ? ? ?Chief Complaint:  ? ?OBESITY ?[name] is here to discuss her progress with her obesity treatment plan along with follow-up of her obesity related diagnoses. [name] is on the Category 2 Plan and states she is following her eating plan approximately 80-85% of the time. [name] states she is walking for 30 minutes 2 times per week. ? ?Today's visit was #: 34 ?Starting weight: 228 lbs ?Starting date: 07/20/2020 ?Today's weight: 184 lbs ?Today's date: 06/27/2021 ?Total lbs lost to date: 86 ?Total lbs lost since last in-office visit: 0 ? ?Interim History: [name] has had extra challenges with following her plan recently. Her husband was hospitalized with a TIA and she has been off her normal routine. She is ready to get back on track.  ? ?Subjective:  ? ?1. Vitamin D deficiency ?[name] is on Vitamin D, and last level was at goal. She has no signs  of over-replacement.  ? ?2. Essential hypertension ?[name]'s blood pressure is better controlled after her medications were adjusted by Cardiology. Her K+ is now at goal. ? ?Assessment/Plan:  ? ?1. Vitamin D deficiency ?We will refill prescription Vitamin D for 1 month, and  we will recheck labs next month. [name]  will follow-up for routine testing of Vitamin D, at least 2-3 times per year to avoid over-replacement. ? ?- Vitamin D, Ergocalciferol, (DRISDOL) 1.25 MG (50000 UNIT) CAPS capsule; Take 1 capsule (50,000 Units total) by mouth every 7 (seven) days.  Dispense: 4 capsule; Refill: 0 ? ?2. Essential hypertension ?[name] will continue with diet, exercise, and weight loss to improve blood pressure control. We will continue to follow as she continues her lifestyle modifications. ? ?3. Obesity with current BMI 31.6 ?[name] is currently in the action stage of change. As such, her goal is to continue with weight loss efforts. She has agreed to the Category 2 Plan.  ? ?Exercise goals: For substantial health benefits, adults should do at least 150 minutes (2 hours and 30  minutes) a week of moderate-intensity, or 75 minutes (1 hour and 15 minutes) a week of vigorous-intensity aerobic physical activity, or an equivalent combination of moderate- and vigorous-intensity aerobic activity. Aerobic activity should be performed in episodes of at least 10 minutes, and preferably, it should be spread throughout the week. ? ?Behavioral modification strategies: increasing lean protein intake. ? ?[name] has agreed to follow-up with our clinic in 4 to 5 weeks. She was informed of the importance of frequent follow-up visits to maximize her success with intensive lifestyle modifications for her multiple health conditions.  ? ?Objective:  ? ?Blood pressure 115/74, pulse (!) 52, temperature 98.1 ?F (36.7 ?C), height 5\' 4"  (1.626 m), weight 184 lb (83.5 kg), SpO2 98 %. ?Body mass index is 31.58 kg/m?. ? ?General: Cooperative, alert, well developed, in no acute distress. ?HEENT: Conjunctivae and lids unremarkable. ?Cardiovascular: Regular rhythm.  ?Lungs: Normal work of breathing. ?Neurologic: No focal deficits.  ? ?Lab Results  ?Component Value Date  ? CREATININE 0.87 06/16/2021  ? BUN 16 06/16/2021  ? NA 142 06/16/2021  ? K 3.6 06/16/2021  ? CL 100 06/16/2021  ? CO2 29 06/16/2021  ? ?Lab Results  ?Component Value Date  ? ALT 24 02/16/2021  ? AST 15 02/16/2021  ? ALKPHOS 70 02/16/2021  ? BILITOT 1.3 (H) 02/16/2021  ? ?Lab Results  ?Component Value Date  ? HGBA1C 5.6 02/16/2021  ? HGBA1C 6.0 (H) 07/20/2020  ? ?Lab Results  ?Component Value Date  ? INSULIN 5.9 02/16/2021  ? INSULIN  17.8 07/20/2020  ? ?Lab Results  ?Component Value Date  ? TSH 1.680 11/02/2020  ? ?Lab Results  ?Component Value Date  ? CHOL 144 02/16/2021  ? HDL 43 02/16/2021  ? [place] 89 02/16/2021  ? TRIG 58 02/16/2021  ? CHOLHDL 3.3 12/23/2018  ? ?Lab Results  ?Component Value Date  ? VD25OH 63.7 02/16/2021  ? VD25OH 33.6 07/20/2020  ? ?Lab Results  ?Component Value Date  ? WBC 7.8 07/20/2020  ? HGB 13.3 07/20/2020  ? HCT 39.7  07/20/2020  ? MCV 91 07/20/2020  ? PLT 497 (H) 04/18/2019  ? ?Lab Results  ?Component Value Date  ? FERRITIN 1,561 (H) 04/13/2019  ? ?Attestation Statements:  ? ?Reviewed by clinician on day of visit: allergies, medications, problem list, medical history, surgical history, family history, social history, and previous encounter notes. ? ? ?I, Trixie Dredge, am acting as transcriptionist for Dennard Nip, MD. ? ?I have reviewed the above documentation for accuracy and completeness, and I agree with the above. -  Dennard Nip, MD ? ? ?

## 2021-07-31 ENCOUNTER — Encounter (INDEPENDENT_AMBULATORY_CARE_PROVIDER_SITE_OTHER): Payer: Self-pay | Admitting: Family Medicine

## 2021-07-31 ENCOUNTER — Ambulatory Visit (INDEPENDENT_AMBULATORY_CARE_PROVIDER_SITE_OTHER): Payer: 59 | Admitting: Family Medicine

## 2021-07-31 VITALS — BP 136/70 | HR 51 | Temp 98.0°F | Ht 64.0 in | Wt 185.0 lb

## 2021-07-31 DIAGNOSIS — E669 Obesity, unspecified: Secondary | ICD-10-CM | POA: Diagnosis not present

## 2021-07-31 DIAGNOSIS — U099 Post covid-19 condition, unspecified: Secondary | ICD-10-CM

## 2021-07-31 DIAGNOSIS — Z6831 Body mass index (BMI) 31.0-31.9, adult: Secondary | ICD-10-CM | POA: Diagnosis not present

## 2021-07-31 DIAGNOSIS — R053 Chronic cough: Secondary | ICD-10-CM

## 2021-07-31 MED ORDER — PROAIR HFA 108 (90 BASE) MCG/ACT IN AERS: 2.0000 | INHALATION_SPRAY | RESPIRATORY_TRACT | 0 refills | Status: DC | PRN

## 2021-08-07 ENCOUNTER — Other Ambulatory Visit (INDEPENDENT_AMBULATORY_CARE_PROVIDER_SITE_OTHER): Payer: Self-pay | Admitting: *Deleted

## 2021-08-07 MED ORDER — ALBUTEROL SULFATE 108 (90 BASE) MCG/ACT IN AEPB: 2.0000 | INHALATION_SPRAY | RESPIRATORY_TRACT | 0 refills | Status: DC | PRN

## 2021-08-07 NOTE — Telephone Encounter (Signed)
Received fax from pharmacy stating ProAir inhaler brand is no longer made in generic. ProRespiClick is available though and would it be ok to change?

## 2021-08-07 NOTE — Telephone Encounter (Signed)
Yes, that is fine. 

## 2021-08-08 ENCOUNTER — Other Ambulatory Visit (INDEPENDENT_AMBULATORY_CARE_PROVIDER_SITE_OTHER): Payer: Self-pay | Admitting: *Deleted

## 2021-08-08 NOTE — Telephone Encounter (Signed)
Per pharmacy Respiclick is not covered but Ventolin Inhaler is. Is it ok to switch?

## 2021-08-08 NOTE — Telephone Encounter (Signed)
yes

## 2021-08-09 MED ORDER — ALBUTEROL SULFATE HFA 108 (90 BASE) MCG/ACT IN AERS: 2.0000 | INHALATION_SPRAY | Freq: Four times a day (QID) | RESPIRATORY_TRACT | 0 refills | Status: DC | PRN

## 2021-08-15 NOTE — Progress Notes (Unsigned)
Chief Complaint:   OBESITY [name] is here to discuss her progress with her obesity treatment plan along with follow-up of her obesity related diagnoses. [name] is on the Category 2 Plan and states she is following her eating plan approximately 85% of the time. [name] states she is walking, lifting weights, and bike riding for 15-30 minutes 3-4 times per week.  Today's visit was #: 18 Starting weight: 228 lbs Starting date: 07/20/2020 Today's weight: 185 lbs Today's date: 07/31/2021 Total lbs lost to date: 43 Total lbs lost since last in-office visit: 0  Interim History: [name] has done more celebration eating since her last visit. She tried to make smarter choices and only gained 1 lb. She is ready to get back on track.   Subjective:   1. Post-COVID chronic cough [name] is out of albuterol which she uses rarely. She requests a refill of this wile she waits to see her new PCP this Summer.   Assessment/Plan:   1. Post-COVID chronic cough We will refill Proair HFA for 1 month. [name] is to follow up with her new PCP for further evaluation and care.   2. Obesity, Current BMI 31.8 [name] is currently in the action stage of change. As such, her goal is to continue with weight loss efforts. She has agreed to the Category 2 Plan.   Exercise goals: As is.   Behavioral modification strategies: increasing lean protein intake and meal planning and cooking strategies.  [name] has agreed to follow-up with our clinic in 4 weeks. She was informed of the importance of frequent follow-up visits to maximize her success with intensive lifestyle modifications for her multiple health conditions.   Objective:   Blood pressure 136/70, pulse (!) 51, temperature 98 F (36.7 C), height 5\' 4"  (1.626 m), weight 185 lb (83.9 kg), SpO2 99 %. Body mass index is 31.76 kg/m.  General: Cooperative, alert, well developed, in no acute distress. HEENT: Conjunctivae and lids unremarkable. Cardiovascular:  Regular rhythm.  Lungs: Normal work of breathing. Neurologic: No focal deficits.   Lab Results  Component Value Date   CREATININE 0.87 06/16/2021   BUN 16 06/16/2021   NA 142 06/16/2021   K 3.6 06/16/2021   CL 100 06/16/2021   CO2 29 06/16/2021   Lab Results  Component Value Date   ALT 24 02/16/2021   AST 15 02/16/2021   ALKPHOS 70 02/16/2021   BILITOT 1.3 (H) 02/16/2021   Lab Results  Component Value Date   HGBA1C 5.6 02/16/2021   HGBA1C 6.0 (H) 07/20/2020   Lab Results  Component Value Date   INSULIN 5.9 02/16/2021   INSULIN 17.8 07/20/2020   Lab Results  Component Value Date   TSH 1.680 11/02/2020   Lab Results  Component Value Date   CHOL 144 02/16/2021   HDL 43 02/16/2021   LDLCALC 89 02/16/2021   TRIG 58 02/16/2021   CHOLHDL 3.3 12/23/2018   Lab Results  Component Value Date   VD25OH 63.7 02/16/2021   VD25OH 33.6 07/20/2020   Lab Results  Component Value Date   WBC 7.8 07/20/2020   HGB 13.3 07/20/2020   HCT 39.7 07/20/2020   MCV 91 07/20/2020   PLT 497 (H) 04/18/2019   Lab Results  Component Value Date   FERRITIN 1,561 (H) 04/13/2019   Attestation Statements:   Reviewed by clinician on day of visit: allergies, medications, problem list, medical history, surgical history, family history, social history, and previous encounter notes.  Time spent on  visit including pre-visit chart review and post-visit care and charting was 30 minutes.   I, Trixie Dredge, am acting as transcriptionist for Dennard Nip, MD.  I have reviewed the above documentation for accuracy and completeness, and I agree with the above. -  Dennard Nip, MD

## 2021-08-28 ENCOUNTER — Encounter (INDEPENDENT_AMBULATORY_CARE_PROVIDER_SITE_OTHER): Payer: Self-pay | Admitting: Family Medicine

## 2021-08-28 ENCOUNTER — Ambulatory Visit (INDEPENDENT_AMBULATORY_CARE_PROVIDER_SITE_OTHER): Payer: 59 | Admitting: Family Medicine

## 2021-08-28 VITALS — BP 125/66 | HR 51 | Temp 98.1°F | Ht 64.0 in | Wt 184.0 lb

## 2021-08-28 DIAGNOSIS — Z6831 Body mass index (BMI) 31.0-31.9, adult: Secondary | ICD-10-CM | POA: Diagnosis not present

## 2021-08-28 DIAGNOSIS — E559 Vitamin D deficiency, unspecified: Secondary | ICD-10-CM

## 2021-08-28 DIAGNOSIS — E669 Obesity, unspecified: Secondary | ICD-10-CM

## 2021-08-28 DIAGNOSIS — F439 Reaction to severe stress, unspecified: Secondary | ICD-10-CM

## 2021-08-28 MED ORDER — VITAMIN D (ERGOCALCIFEROL) 1.25 MG (50000 UNIT) PO CAPS: 50000.0000 [IU] | ORAL_CAPSULE | ORAL | 0 refills | Status: DC

## 2021-08-28 NOTE — Progress Notes (Unsigned)
Chief Complaint:   OBESITY [name] is here to discuss her progress with her obesity treatment plan along with follow-up of her obesity related diagnoses. [name] is on the Category 2 Plan and states she is following her eating plan approximately 85% of the time. [name] states she is mix cardio, stationary bike for 15-30 minutes 4 times per week.  Today's visit was #: 63 Starting weight: 228 lbs Starting date: 07/20/2020 Today's weight: 184 lbs Today's date: 08/28/2021 Total lbs lost to date: 44 Total lbs lost since last in-office visit: 1  Interim History: [name] continues to do well with weight loss despite stress.  She struggles to eat all of her protein on her plan.  Subjective:   1. Stress [name] notes increased family/home stress and this is leaving her feeling tired and somewhat fatigued.  She is also struggling to use her CPAP as regularly.  2. Vitamin D deficiency [name] is on vitamin D, but her level is not yet at goal.  Assessment/Plan:   1. Stress [name] was offered support and encouragement, and she was encouraged to use her CPAP to help with fatigue.  2. Vitamin D deficiency We will refill prescription Vitamin D for 1 month. [name] Will follow-up for routine testing of Vitamin D, at least 2-3 times per year to avoid over-replacement.  - Vitamin D, Ergocalciferol, (DRISDOL) 1.25 MG (50000 UNIT) CAPS capsule; Take 1 capsule (50,000 Units total) by mouth every 7 (seven) days.  Dispense: 4 capsule; Refill: 0  3. Obesity, Current BMI 31.7 [name] is currently in the action stage of change. As such, her goal is to continue with weight loss efforts. She has agreed to the Category 2 Plan.   We will recheck fasting labs at her next visit.   Exercise goals: As is.   Behavioral modification strategies: increasing lean protein intake and meal planning and cooking strategies.  [name] has agreed to follow-up with our clinic in 4 weeks. She was informed of the importance of  frequent follow-up visits to maximize her success with intensive lifestyle modifications for her multiple health conditions.   Objective:   Blood pressure 125/66, pulse (!) 51, temperature 98.1 F (36.7 C), height 5\' 4"  (1.626 m), weight 184 lb (83.5 kg), SpO2 97 %. Body mass index is 31.58 kg/m.  General: Cooperative, alert, well developed, in no acute distress. HEENT: Conjunctivae and lids unremarkable. Cardiovascular: Regular rhythm.  Lungs: Normal work of breathing. Neurologic: No focal deficits.   Lab Results  Component Value Date   CREATININE 0.87 06/16/2021   BUN 16 06/16/2021   NA 142 06/16/2021   K 3.6 06/16/2021   CL 100 06/16/2021   CO2 29 06/16/2021   Lab Results  Component Value Date   ALT 24 02/16/2021   AST 15 02/16/2021   ALKPHOS 70 02/16/2021   BILITOT 1.3 (H) 02/16/2021   Lab Results  Component Value Date   HGBA1C 5.6 02/16/2021   HGBA1C 6.0 (H) 07/20/2020   Lab Results  Component Value Date   INSULIN 5.9 02/16/2021   INSULIN 17.8 07/20/2020   Lab Results  Component Value Date   TSH 1.680 11/02/2020   Lab Results  Component Value Date   CHOL 144 02/16/2021   HDL 43 02/16/2021   LDLCALC 89 02/16/2021   TRIG 58 02/16/2021   CHOLHDL 3.3 12/23/2018   Lab Results  Component Value Date   VD25OH 63.7 02/16/2021   VD25OH 33.6 07/20/2020   Lab Results  Component Value Date  WBC 7.8 07/20/2020   HGB 13.3 07/20/2020   HCT 39.7 07/20/2020   MCV 91 07/20/2020   PLT 497 (H) 04/18/2019   Lab Results  Component Value Date   FERRITIN 1,561 (H) 04/13/2019   Attestation Statements:   Reviewed by clinician on day of visit: allergies, medications, problem list, medical history, surgical history, family history, social history, and previous encounter notes.   I, Trixie Dredge, am acting as transcriptionist for Dennard Nip, MD.  I have reviewed the above documentation for accuracy and completeness, and I agree with the above. -  Dennard Nip, MD

## 2021-08-30 ENCOUNTER — Encounter: Payer: Self-pay | Admitting: Internal Medicine

## 2021-08-30 ENCOUNTER — Ambulatory Visit: Payer: 59 | Admitting: Internal Medicine

## 2021-08-30 VITALS — BP 102/62 | HR 63 | Ht 64.0 in | Wt 189.0 lb

## 2021-08-30 DIAGNOSIS — I493 Ventricular premature depolarization: Secondary | ICD-10-CM | POA: Diagnosis not present

## 2021-08-30 NOTE — Progress Notes (Signed)
HPI [name] returns today for ongoing evaluation and management of her PVC's. She is a pleasant 61 yo woman with above who has had nice control of her symptoms on flecainide and toprol. In the interim, she has had occaisional break throughs of PVC's. She denies chest pain or sob. She is no longer on eliquis. She has lost over 30 lbs since her last visit.  Allergies  Allergen Reactions   Buprenorphine Hcl Itching   Morphine And Related Itching   Morphine Sulfate Other (See Comments)   Other    Short Ragweed Pollen Ext Hives     Current Outpatient Medications  Medication Sig Dispense Refill   albuterol (VENTOLIN HFA) 108 (90 Base) MCG/ACT inhaler Inhale 2 puffs into the lungs every 6 (six) hours as needed for wheezing or shortness of breath. 1 each 0   aspirin 81 MG chewable tablet Chew 1 tablet (81 mg total) by mouth daily.     atorvastatin (LIPITOR) 40 MG tablet Take 1 tablet (40 mg total) by mouth daily. 90 tablet 3   azelastine (ASTELIN) 0.1 % nasal spray Place into both nostrils as needed. Use in each nostril as directed     carboxymethylcellulose (REFRESH PLUS) 0.5 % SOLN 1 drop daily as needed. 2 drops each eye     chlorthalidone (HYGROTON) 25 MG tablet Take 1 tablet (25 mg total) by mouth daily. 90 tablet 3   estradiol (VIVELLE-DOT) 0.05 MG/24HR patch Place 1 patch onto the skin 2 (two) times a week.   0   famotidine (PEPCID) 20 MG tablet Take 1 tablet (20 mg total) by mouth daily. 90 tablet 3   flecainide (TAMBOCOR) 150 MG tablet Take 1/2 (one-half) tablet by mouth twice daily 90 tablet 2   HYDROcodone-acetaminophen (NORCO) 5-325 MG tablet Take 1 tablet by mouth every 6 (six) hours as needed. 20 tablet 0   Ipratropium-Albuterol (COMBIVENT RESPIMAT) 20-100 MCG/ACT AERS respimat Inhale 1 puff into the lungs every 6 (six) hours.     meloxicam (MOBIC) 15 MG tablet Take 15 mg by mouth daily.     metoprolol succinate (TOPROL-XL) 50 MG 24 hr tablet TAKE 1 TABLET BY MOUTH ONCE  DAILY **TAKE  WITH  OR  IMMEDIATELY  FOLLOWING  A  MEAL 90 tablet 3   montelukast (SINGULAIR) 10 MG tablet Take 10 mg by mouth daily as needed.     potassium chloride SA (KLOR-CON M) 20 MEQ tablet Take 1 tablet (20 mEq total) by mouth daily. 90 tablet 3   Probiotic Product (PROBIOTIC-10 PO) Take by mouth as needed.     Vitamin D, Ergocalciferol, (DRISDOL) 1.25 MG (50000 UNIT) CAPS capsule Take 1 capsule (50,000 Units total) by mouth every 7 (seven) days. 4 capsule 0   No current facility-administered medications for this visit.     Past Medical History:  Diagnosis Date   Arthritis    low back   Back pain    Baker's cyst of knee, right    Constipation    GERD (gastroesophageal reflux disease)    Heart burn    Hyperlipidemia    Hypertension    IBS (irritable bowel syndrome)    Joint pain    Lactose intolerance    Muscle pain    Other fatigue    Palpitations    Pre-diabetes    PVC (premature ventricular contraction)    Ringing in ear    Sleep apnea    SOB (shortness of breath) on exertion  Vitamin D deficiency     ROS:   All systems reviewed and negative except as noted in the HPI.   Past Surgical History:  Procedure Laterality Date   APPENDECTOMY     carpal tunn     CESAREAN SECTION     KNEE ARTHROSCOPY WITH LATERAL MENISECTOMY Right 03/08/2021   Procedure: RIGHT KNEE PARTIAL LATERAL MENISCECTOMY;  Surgeon: Tarry Kos, MD;  Location: [facility];  Service: Orthopedics;  Laterality: Right;   NEUROMA SURGERY     SEPTOPLASTY     TONSILLECTOMY     TOTAL ABDOMINAL HYSTERECTOMY W/ BILATERAL SALPINGOOPHORECTOMY     TUBAL LIGATION       Family History  Problem Relation Age of Onset   Sudden death Mother    Hypertension Father    Stroke Father    Cancer Father    Alcoholism Father    Hyperlipidemia Sister    Hyperlipidemia Brother      Social History   Socioeconomic History   Marital status: Married    Spouse name: Not on file   Number  of children: Not on file   Years of education: Not on file   Highest education level: Not on file  Occupational History   Occupation: Stay at home spouse  Tobacco Use   Smoking status: Never   Smokeless tobacco: Never  Vaping Use   Vaping Use: Never used  Substance and Sexual Activity   Alcohol use: Not Currently   Drug use: Never   Sexual activity: Not on file  Other Topics Concern   Not on file  Social History Narrative   Tobacco use   Cigarettes: Never smoked   Tobacco history last updated 11/03/2013   No smoking   No alcohol    Yes caffeine   Recreational drug use  : no   Diet : regular    Exercise, Minimal   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   Social Determinants of Health   Financial Resource Strain: Not on file  Food Insecurity: Not on file  Transportation Needs: Not on file  Physical Activity: Not on file  Stress: Not on file  Social Connections: Not on file  Intimate Partner Violence: Not on file     BP 102/62   Pulse 63   Ht 5\' 4"  (1.626 m)   Wt 189 lb (85.7 kg)   SpO2 98%   BMI 32.44 kg/m   Physical Exam:  Well appearing NAD HEENT: Unremarkable Neck:  No JVD, no thyromegally Lymphatics:  No adenopathy Back:  No CVA tenderness Lungs:  Clear HEART:  Regular rate rhythm, no murmurs, no rubs, no clicks Abd:  soft, positive bowel sounds, no organomegally, no rebound, no guarding Ext:  2 plus pulses, no edema, no cyanosis, no clubbing Skin:  No rashes no nodules Neuro:  CN II through XII intact, motor grossly intact  EKG - nsr   Assess/Plan:  1. PVC' s - she has had a minimal number of symptoms. She will continue flecainide and metoprolol 2. Obesity - I encouraged her to lose weight.  3. HTN -her bp is controlled. 4. Dyslipidemia - she will continue lipitor.     Amy Going,MD

## 2021-08-30 NOTE — Patient Instructions (Addendum)
Medication Instructions:  Your physician recommends that you continue on your current medications as directed. Please refer to the Current Medication list given to you today.  Labwork: None ordered.  Testing/Procedures: None ordered.  Follow-Up: Your physician wants you to follow-up in: 6 months with Lewayne Bunting, MD.    Any Other Special Instructions Will Be Listed Below (If Applicable).  If you need a refill on your cardiac medications before your next appointment, please call your pharmacy.   Important Information About Sugar

## 2021-09-26 ENCOUNTER — Encounter (INDEPENDENT_AMBULATORY_CARE_PROVIDER_SITE_OTHER): Payer: Self-pay | Admitting: Family Medicine

## 2021-09-26 ENCOUNTER — Ambulatory Visit (INDEPENDENT_AMBULATORY_CARE_PROVIDER_SITE_OTHER): Payer: 59 | Admitting: Family Medicine

## 2021-09-26 VITALS — BP 109/57 | HR 52 | Temp 98.1°F | Ht 64.0 in | Wt 184.0 lb

## 2021-09-26 DIAGNOSIS — R7303 Prediabetes: Secondary | ICD-10-CM | POA: Diagnosis not present

## 2021-09-26 DIAGNOSIS — Z6831 Body mass index (BMI) 31.0-31.9, adult: Secondary | ICD-10-CM

## 2021-09-26 DIAGNOSIS — E7800 Pure hypercholesterolemia, unspecified: Secondary | ICD-10-CM | POA: Insufficient documentation

## 2021-09-26 DIAGNOSIS — E7849 Other hyperlipidemia: Secondary | ICD-10-CM | POA: Diagnosis not present

## 2021-09-26 DIAGNOSIS — E559 Vitamin D deficiency, unspecified: Secondary | ICD-10-CM

## 2021-09-26 DIAGNOSIS — E669 Obesity, unspecified: Secondary | ICD-10-CM | POA: Diagnosis not present

## 2021-09-26 NOTE — Progress Notes (Unsigned)
Chief Complaint:   OBESITY [name] is here to discuss her progress with her obesity treatment plan along with follow-up of her obesity related diagnoses. [name] is on the Category 2 Plan and states she is following her eating plan approximately 75% of the time. [name] states she is doing  0 minutes 0 times per week.  Today's visit was #: 20 Starting weight: 228 lbs Starting date: 07/20/2020 Today's weight: 184 lbs Today's date: 09/26/2021 Total lbs lost to date: 44 Total lbs lost since last in-office visit: 0  Interim History: [name] has done well with maintaining her weight loss since her last visit. She is working on meal planning and increasing her protein.   Subjective:   1. Pre-diabetes [name] is working on decreasing simple carbohydrates. She denies signs of hypoglycemia.   2. Vitamin D deficiency [name] is on Vitamin D, and she is due for labs.   3. Other hyperlipidemia [name] is working on decreasing cholesterol in her diet, and she is on statin.   Assessment/Plan:   1. Pre-diabetes We will check labs today. [name] will continue to work on weight loss, exercise, and decreasing simple carbohydrates to help decrease the risk of diabetes.   - CBC with Differential/Platelet - CMP14+EGFR - Insulin, random - Hemoglobin A1c  2. Vitamin D deficiency We will check labs today. [name] will follow-up for routine testing of Vitamin D, at least 2-3 times per year to avoid over-replacement.  - Vitamin B12 - VITAMIN D 25 Hydroxy (Vit-D Deficiency, Fractures)  3. Other hyperlipidemia We will check labs today. [name] will continue to work on diet, exercise and weight loss efforts. Orders and follow up as documented in patient record.   - Lipid Panel With LDL/HDL Ratio  4. Obesity, Current BMI 31.7 [name] is currently in the action stage of change. As such, her goal is to continue with weight loss efforts. She has agreed to the Category 2 Plan.   Behavioral modification  strategies: increasing lean protein intake, increasing water intake, and meal planning and cooking strategies.  [name] has agreed to follow-up with our clinic in 3 weeks. She was informed of the importance of frequent follow-up visits to maximize her success with intensive lifestyle modifications for her multiple health conditions.   [name] was informed we would discuss her lab results at her next visit unless there is a critical issue that needs to be addressed sooner. [name] agreed to keep her next visit at the agreed upon time to discuss these results.  Objective:   Blood pressure (!) 109/57, pulse (!) 52, temperature 98.1 F (36.7 C), height '5\' 4"'  (1.626 m), weight 184 lb (83.5 kg), SpO2 98 %. Body mass index is 31.58 kg/m.  General: Cooperative, alert, well developed, in no acute distress. HEENT: Conjunctivae and lids unremarkable. Cardiovascular: Regular rhythm.  Lungs: Normal work of breathing. Neurologic: No focal deficits.   Lab Results  Component Value Date   CREATININE 0.87 06/16/2021   BUN 16 06/16/2021   NA 142 06/16/2021   K 3.6 06/16/2021   CL 100 06/16/2021   CO2 29 06/16/2021   Lab Results  Component Value Date   ALT 24 02/16/2021   AST 15 02/16/2021   ALKPHOS 70 02/16/2021   BILITOT 1.3 (H) 02/16/2021   Lab Results  Component Value Date   HGBA1C 5.6 02/16/2021   HGBA1C 6.0 (H) 07/20/2020   Lab Results  Component Value Date   INSULIN 5.9 02/16/2021   INSULIN 17.8 07/20/2020   Lab  Results  Component Value Date   TSH 1.680 11/02/2020   Lab Results  Component Value Date   CHOL 144 02/16/2021   HDL 43 02/16/2021   LDLCALC 89 02/16/2021   TRIG 58 02/16/2021   CHOLHDL 3.3 12/23/2018   Lab Results  Component Value Date   VD25OH 63.7 02/16/2021   VD25OH 33.6 07/20/2020   Lab Results  Component Value Date   WBC 7.8 07/20/2020   HGB 13.3 07/20/2020   HCT 39.7 07/20/2020   MCV 91 07/20/2020   PLT 497 (H) 04/18/2019   Lab Results  Component  Value Date   FERRITIN 1,561 (H) 04/13/2019   Attestation Statements:   Reviewed by clinician on day of visit: allergies, medications, problem list, medical history, surgical history, family history, social history, and previous encounter notes.   I, Trixie Dredge, am acting as transcriptionist for Dennard Nip, MD.  I have reviewed the above documentation for accuracy and completeness, and I agree with the above. -  Dennard Nip, MD

## 2021-09-27 LAB — CMP14+EGFR
ALT: 23 IU/L (ref 0–32)
AST: 15 IU/L (ref 0–40)
Albumin/Globulin Ratio: 1.9 (ref 1.2–2.2)
Albumin: 4.6 g/dL (ref 3.9–4.9)
Alkaline Phosphatase: 59 IU/L (ref 44–121)
BUN/Creatinine Ratio: 14 (ref 12–28)
BUN: 12 mg/dL (ref 8–27)
Bilirubin Total: 1.5 mg/dL — ABNORMAL HIGH (ref 0.0–1.2)
CO2: 26 mmol/L (ref 20–29)
Calcium: 9.9 mg/dL (ref 8.7–10.3)
Chloride: 102 mmol/L (ref 96–106)
Creatinine, Ser: 0.83 mg/dL (ref 0.57–1.00)
Globulin, Total: 2.4 g/dL (ref 1.5–4.5)
Glucose: 94 mg/dL (ref 70–99)
Potassium: 4.3 mmol/L (ref 3.5–5.2)
Sodium: 142 mmol/L (ref 134–144)
Total Protein: 7 g/dL (ref 6.0–8.5)
eGFR: 80 mL/min/{1.73_m2} (ref >59)

## 2021-09-27 LAB — CBC WITH DIFFERENTIAL/PLATELET
Basophils Absolute: 0.1 10*3/uL (ref 0.0–0.2)
Basos: 1 %
EOS (ABSOLUTE): 0.3 10*3/uL (ref 0.0–0.4)
Eos: 6 %
Hematocrit: 37.2 % (ref 34.0–46.6)
Hemoglobin: 12.4 g/dL (ref 11.1–15.9)
Immature Grans (Abs): 0 10*3/uL (ref 0.0–0.1)
Immature Granulocytes: 0 %
Lymphocytes Absolute: 1.6 10*3/uL (ref 0.7–3.1)
Lymphs: 27 %
MCH: 30.2 pg (ref 26.6–33.0)
MCHC: 33.3 g/dL (ref 31.5–35.7)
MCV: 91 fL (ref 79–97)
Monocytes Absolute: 0.4 10*3/uL (ref 0.1–0.9)
Monocytes: 8 %
Neutrophils Absolute: 3.4 10*3/uL (ref 1.4–7.0)
Neutrophils: 58 %
Platelets: 301 10*3/uL (ref 150–450)
RBC: 4.1 x10E6/uL (ref 3.77–5.28)
RDW: 11.5 % — ABNORMAL LOW (ref 11.7–15.4)
WBC: 5.8 10*3/uL (ref 3.4–10.8)

## 2021-09-27 LAB — LIPID PANEL WITH LDL/HDL RATIO
Cholesterol, Total: 158 mg/dL (ref 100–199)
HDL: 52 mg/dL (ref >39)
LDL Chol Calc (NIH): 95 mg/dL (ref 0–99)
LDL/HDL Ratio: 1.8 ratio (ref 0.0–3.2)
Triglycerides: 51 mg/dL (ref 0–149)
VLDL Cholesterol Cal: 11 mg/dL (ref 5–40)

## 2021-09-27 LAB — VITAMIN B12: Vitamin B-12: 846 pg/mL (ref 232–1245)

## 2021-09-27 LAB — HEMOGLOBIN A1C
Est. average glucose Bld gHb Est-mCnc: 111 mg/dL
Hgb A1c MFr Bld: 5.5 % (ref 4.8–5.6)

## 2021-09-27 LAB — INSULIN, RANDOM: INSULIN: 2.5 u[IU]/mL — ABNORMAL LOW (ref 2.6–24.9)

## 2021-09-27 LAB — VITAMIN D 25 HYDROXY (VIT D DEFICIENCY, FRACTURES): Vit D, 25-Hydroxy: 50.4 ng/mL (ref 30.0–100.0)

## 2021-10-24 ENCOUNTER — Ambulatory Visit (INDEPENDENT_AMBULATORY_CARE_PROVIDER_SITE_OTHER): Payer: Commercial Managed Care - HMO | Admitting: Family Medicine

## 2021-10-24 VITALS — BP 107/56 | HR 56 | Temp 97.9°F | Ht 64.0 in | Wt 186.0 lb

## 2021-10-24 DIAGNOSIS — Z6832 Body mass index (BMI) 32.0-32.9, adult: Secondary | ICD-10-CM

## 2021-10-24 DIAGNOSIS — E669 Obesity, unspecified: Secondary | ICD-10-CM

## 2021-10-24 DIAGNOSIS — E559 Vitamin D deficiency, unspecified: Secondary | ICD-10-CM

## 2021-10-24 DIAGNOSIS — F3289 Other specified depressive episodes: Secondary | ICD-10-CM

## 2021-10-24 DIAGNOSIS — F32A Depression, unspecified: Secondary | ICD-10-CM | POA: Insufficient documentation

## 2021-10-24 MED ORDER — VITAMIN D (ERGOCALCIFEROL) 1.25 MG (50000 UNIT) PO CAPS: 50000.0000 [IU] | ORAL_CAPSULE | ORAL | 0 refills | Status: DC

## 2021-10-25 ENCOUNTER — Encounter (INDEPENDENT_AMBULATORY_CARE_PROVIDER_SITE_OTHER): Payer: Self-pay

## 2021-10-31 NOTE — Progress Notes (Signed)
Chief Complaint:   OBESITY [name] is here to discuss her progress with her obesity treatment plan along with follow-up of her obesity related diagnoses. [name] is on the Category 2 Plan and states she is following her eating plan approximately 75% of the time. [name] states she is riding the bicycle and doing cardio for 10 minutes 3 times per week.  Today's visit was #: 21 Starting weight: 228 lbs Starting date: 07/20/2020 Today's weight: 186 lbs Today's date: 10/24/2021 Total lbs lost to date: 42 Total lbs lost since last in-office visit: 0  Interim History: [name] continues to work on her weight loss, but she has struggled a bit more. She is extra bust and stressed.   Subjective:   1. Vitamin D deficiency Kare's vitamin D level is at goal, but slightly decreased.  She denies nausea, vomiting, or muscle weakness.  I discussed labs with the patient today.  2. Other depression, emotional eating binges [name] notes increased stress and comfort eating with stress.   Assessment/Plan:   1. Vitamin D deficiency We will refill prescription Vitamin D for 1 month. [name] will follow-up for routine testing of Vitamin D, at least 2-3 times per year to avoid over-replacement.  - Vitamin D, Ergocalciferol, (DRISDOL) 1.25 MG (50000 UNIT) CAPS capsule; Take 1 capsule (50,000 Units total) by mouth every 7 (seven) days.  Dispense: 4 capsule; Refill: 0  2. Other depression, emotional eating binges Emotional eating behavior strategies were discussed. Support and encouragement was given.   3. Obesity, Current BMI 32.0 [name] is currently in the action stage of change. As such, her goal is to continue with weight loss efforts. She has agreed to the Category 2 Plan with lunch.   Exercise goals: As is.   Behavioral modification strategies: increasing lean protein intake, increasing water intake, no skipping meals, and meal planning and cooking strategies.  [name] has agreed to follow-up with  our clinic in 4 weeks. She was informed of the importance of frequent follow-up visits to maximize her success with intensive lifestyle modifications for her multiple health conditions.   Objective:   Blood pressure (!) 107/56, pulse (!) 56, temperature 97.9 F (36.6 C), height 5\' 4"  (1.626 m), weight 186 lb (84.4 kg), SpO2 98 %. Body mass index is 31.93 kg/m.  General: Cooperative, alert, well developed, in no acute distress. HEENT: Conjunctivae and lids unremarkable. Cardiovascular: Regular rhythm.  Lungs: Normal work of breathing. Neurologic: No focal deficits.   Lab Results  Component Value Date   CREATININE 0.83 09/26/2021   BUN 12 09/26/2021   NA 142 09/26/2021   K 4.3 09/26/2021   CL 102 09/26/2021   CO2 26 09/26/2021   Lab Results  Component Value Date   ALT 23 09/26/2021   AST 15 09/26/2021   ALKPHOS 59 09/26/2021   BILITOT 1.5 (H) 09/26/2021   Lab Results  Component Value Date   HGBA1C 5.5 09/26/2021   HGBA1C 5.6 02/16/2021   HGBA1C 6.0 (H) 07/20/2020   Lab Results  Component Value Date   INSULIN 2.5 (L) 09/26/2021   INSULIN 5.9 02/16/2021   INSULIN 17.8 07/20/2020   Lab Results  Component Value Date   TSH 1.680 11/02/2020   Lab Results  Component Value Date   CHOL 158 09/26/2021   HDL 52 09/26/2021   LDLCALC 95 09/26/2021   TRIG 51 09/26/2021   CHOLHDL 3.3 12/23/2018   Lab Results  Component Value Date   VD25OH 50.4 09/26/2021   VD25OH 63.7  02/16/2021   VD25OH 33.6 07/20/2020   Lab Results  Component Value Date   WBC 5.8 09/26/2021   HGB 12.4 09/26/2021   HCT 37.2 09/26/2021   MCV 91 09/26/2021   PLT 301 09/26/2021   Lab Results  Component Value Date   FERRITIN 1,561 (H) 04/13/2019   Attestation Statements:   Reviewed by clinician on day of visit: allergies, medications, problem list, medical history, surgical history, family history, social history, and previous encounter notes.   I, Burt Knack, am acting as transcriptionist  for Quillian Quince, MD.  I have reviewed the above documentation for accuracy and completeness, and I agree with the above. -  Quillian Quince, MD

## 2021-11-23 ENCOUNTER — Encounter (INDEPENDENT_AMBULATORY_CARE_PROVIDER_SITE_OTHER): Payer: Self-pay | Admitting: Family Medicine

## 2021-11-23 ENCOUNTER — Ambulatory Visit (INDEPENDENT_AMBULATORY_CARE_PROVIDER_SITE_OTHER): Payer: Commercial Managed Care - HMO | Admitting: Family Medicine

## 2021-11-23 VITALS — BP 138/71 | HR 50 | Temp 98.1°F | Ht 64.0 in | Wt 182.0 lb

## 2021-11-23 DIAGNOSIS — E559 Vitamin D deficiency, unspecified: Secondary | ICD-10-CM

## 2021-11-23 DIAGNOSIS — E669 Obesity, unspecified: Secondary | ICD-10-CM | POA: Diagnosis not present

## 2021-11-23 DIAGNOSIS — Z6831 Body mass index (BMI) 31.0-31.9, adult: Secondary | ICD-10-CM

## 2021-11-23 DIAGNOSIS — E7849 Other hyperlipidemia: Secondary | ICD-10-CM | POA: Insufficient documentation

## 2021-11-23 MED ORDER — VITAMIN D (ERGOCALCIFEROL) 1.25 MG (50000 UNIT) PO CAPS: 50000.0000 [IU] | ORAL_CAPSULE | ORAL | 0 refills | Status: DC

## 2021-11-23 MED ORDER — ATORVASTATIN CALCIUM 40 MG PO TABS: 40.0000 mg | ORAL_TABLET | ORAL | 0 refills | Status: DC

## 2021-11-29 ENCOUNTER — Other Ambulatory Visit: Payer: Self-pay | Admitting: Student

## 2021-11-29 DIAGNOSIS — I493 Ventricular premature depolarization: Secondary | ICD-10-CM

## 2021-11-29 NOTE — Progress Notes (Signed)
Chief Complaint:   OBESITY [name] is here to discuss her progress with her obesity treatment plan along with follow-up of her obesity related diagnoses. [name] is on the Category 2 Plan with lunch options and states she is following her eating plan approximately 85% of the time. [name] states she is walking, biking, and lifting weights for 15-20 minutes 3-4 times per week.  Today's visit was #: 22 Starting weight: 228 lbs Starting date: 07/20/2020 Today's weight: 182 lbs Today's date: 11/23/2021 Total lbs lost to date: 46 Total lbs lost since last in-office visit: 4  Interim History: [name] continues to do well with weight loss. She notes her hunger is mostly controlled and she is working on meeting her protein goals.   Subjective:   1. Vitamin D deficiency [name] is on Vitamin D, with no side effects noted. She notes her fatigue has improved overall.   2. Other hyperlipidemia [name] is on Lipitor and her labs are very good. She has questions about weight loss and decreasing her statin.   Assessment/Plan:   1. Vitamin D deficiency We will refill prescription Vitamin D 50,000 IU every week for 1 month. [name] will follow-up for routine testing of Vitamin D, at least 2-3 times per year to avoid over-replacement.  - Vitamin D, Ergocalciferol, (DRISDOL) 1.25 MG (50000 UNIT) CAPS capsule; Take 1 capsule (50,000 Units total) by mouth every 7 (seven) days.  Dispense: 4 capsule; Refill: 0  2. Other hyperlipidemia [name] agreed to decrease Lipitor to 40 mg every other day, and we will refill for 1 month. She will continue with her diet and weight loss. We will recheck labs in 6 week.   - atorvastatin (LIPITOR) 40 MG tablet; Take 1 tablet (40 mg total) by mouth every other day.  Dispense: 30 tablet; Refill: 0  3. Obesity, Current BMI 31.4 [name] is currently in the action stage of change. As such, her goal is to continue with weight loss efforts. She has agreed to the Category 2 Plan.    We will recheck fasting labs at her next visit.  Exercise goals: As is.   Behavioral modification strategies: increasing lean protein intake.  [name] has agreed to follow-up with our clinic in 6 weeks. She was informed of the importance of frequent follow-up visits to maximize her success with intensive lifestyle modifications for her multiple health conditions.   Objective:   Blood pressure 138/71, pulse (!) 50, temperature 98.1 F (36.7 C), height 5\' 4"  (1.626 m), weight 182 lb (82.6 kg), SpO2 99 %. Body mass index is 31.24 kg/m.  General: Cooperative, alert, well developed, in no acute distress. HEENT: Conjunctivae and lids unremarkable. Cardiovascular: Regular rhythm.  Lungs: Normal work of breathing. Neurologic: No focal deficits.   Lab Results  Component Value Date   CREATININE 0.83 09/26/2021   BUN 12 09/26/2021   NA 142 09/26/2021   K 4.3 09/26/2021   CL 102 09/26/2021   CO2 26 09/26/2021   Lab Results  Component Value Date   ALT 23 09/26/2021   AST 15 09/26/2021   ALKPHOS 59 09/26/2021   BILITOT 1.5 (H) 09/26/2021   Lab Results  Component Value Date   HGBA1C 5.5 09/26/2021   HGBA1C 5.6 02/16/2021   HGBA1C 6.0 (H) 07/20/2020   Lab Results  Component Value Date   INSULIN 2.5 (L) 09/26/2021   INSULIN 5.9 02/16/2021   INSULIN 17.8 07/20/2020   Lab Results  Component Value Date   TSH 1.680 11/02/2020  Lab Results  Component Value Date   CHOL 158 09/26/2021   HDL 52 09/26/2021   LDLCALC 95 09/26/2021   TRIG 51 09/26/2021   CHOLHDL 3.3 12/23/2018   Lab Results  Component Value Date   VD25OH 50.4 09/26/2021   VD25OH 63.7 02/16/2021   VD25OH 33.6 07/20/2020   Lab Results  Component Value Date   WBC 5.8 09/26/2021   HGB 12.4 09/26/2021   HCT 37.2 09/26/2021   MCV 91 09/26/2021   PLT 301 09/26/2021   Lab Results  Component Value Date   FERRITIN 1,561 (H) 04/13/2019   Attestation Statements:   Reviewed by clinician on day of visit:  allergies, medications, problem list, medical history, surgical history, family history, social history, and previous encounter notes.   I, Burt Knack, am acting as transcriptionist for Quillian Quince, MD.  I have reviewed the above documentation for accuracy and completeness, and I agree with the above. -  Quillian Quince, MD

## 2021-12-22 ENCOUNTER — Other Ambulatory Visit: Payer: Self-pay | Admitting: Family Medicine

## 2021-12-22 DIAGNOSIS — Z1231 Encounter for screening mammogram for malignant neoplasm of breast: Secondary | ICD-10-CM

## 2021-12-25 ENCOUNTER — Ambulatory Visit
Admission: RE | Admit: 2021-12-25 | Discharge: 2021-12-25 | Disposition: A | Discharge status: Home / Self Care | Payer: Commercial Managed Care - HMO | Source: Ambulatory Visit | Attending: Family Medicine | Admitting: Family Medicine

## 2021-12-25 DIAGNOSIS — Z1231 Encounter for screening mammogram for malignant neoplasm of breast: Secondary | ICD-10-CM

## 2022-01-03 IMAGING — DX DG KNEE AP/LAT W/ SUNRISE*R*
3 series · 3 of 3 positions shown · non-contrast
Comparison: None

CLINICAL DATA: RIGHT knee pain for 2 months, question Baker cyst

EXAM:
RIGHT KNEE 3 VIEWS

[knee ap]
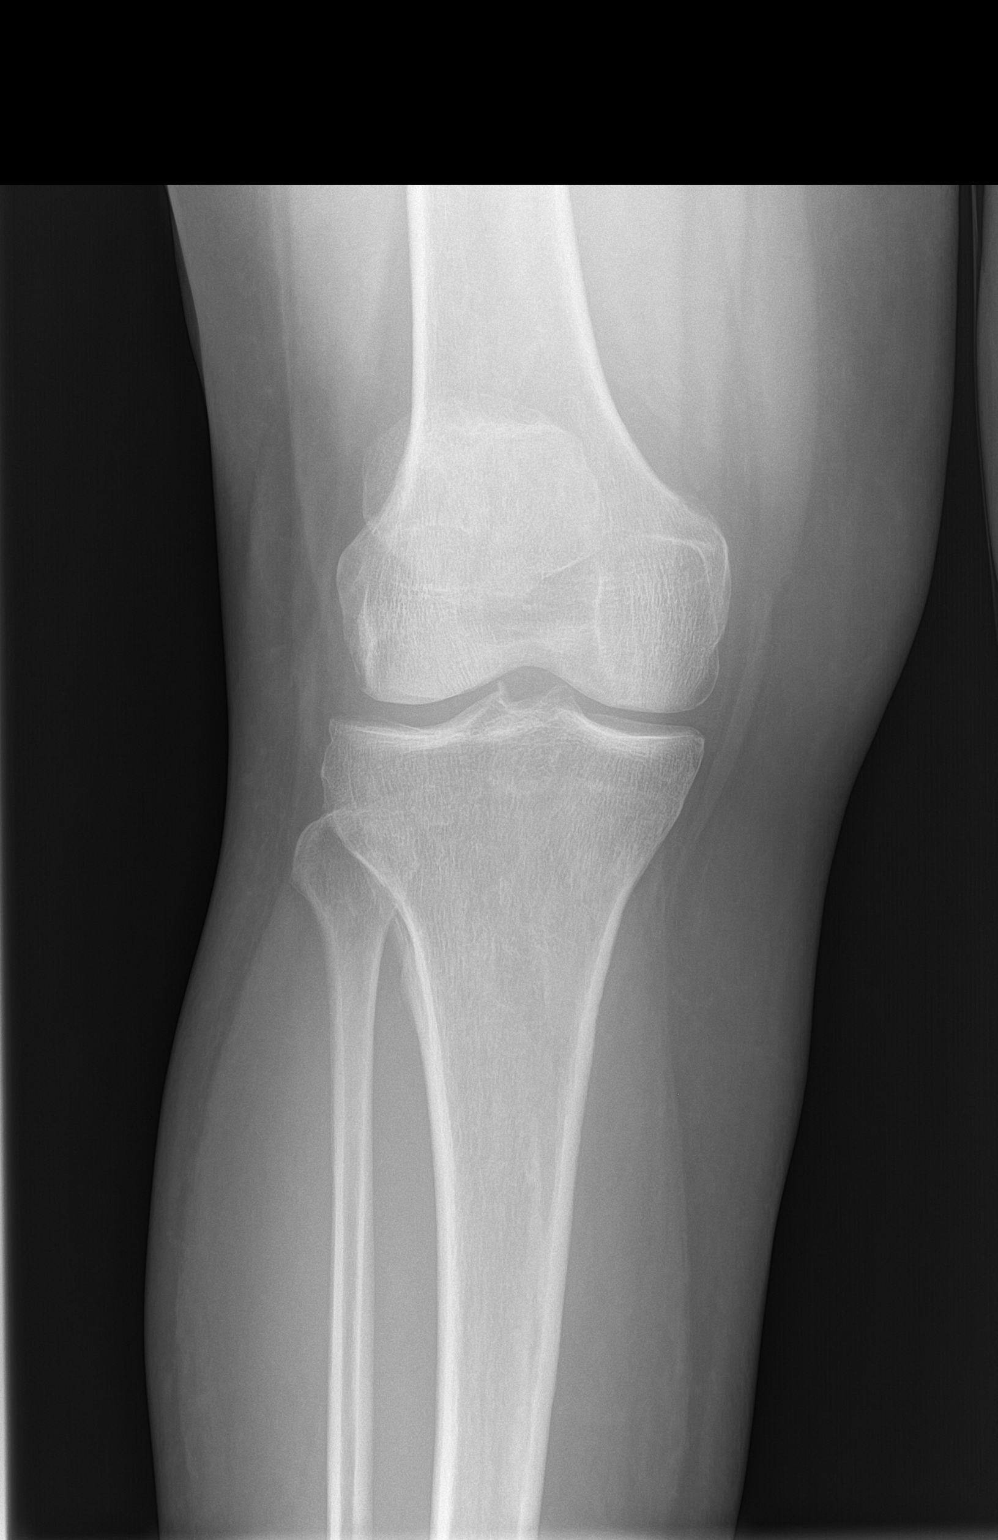

[knee lat]
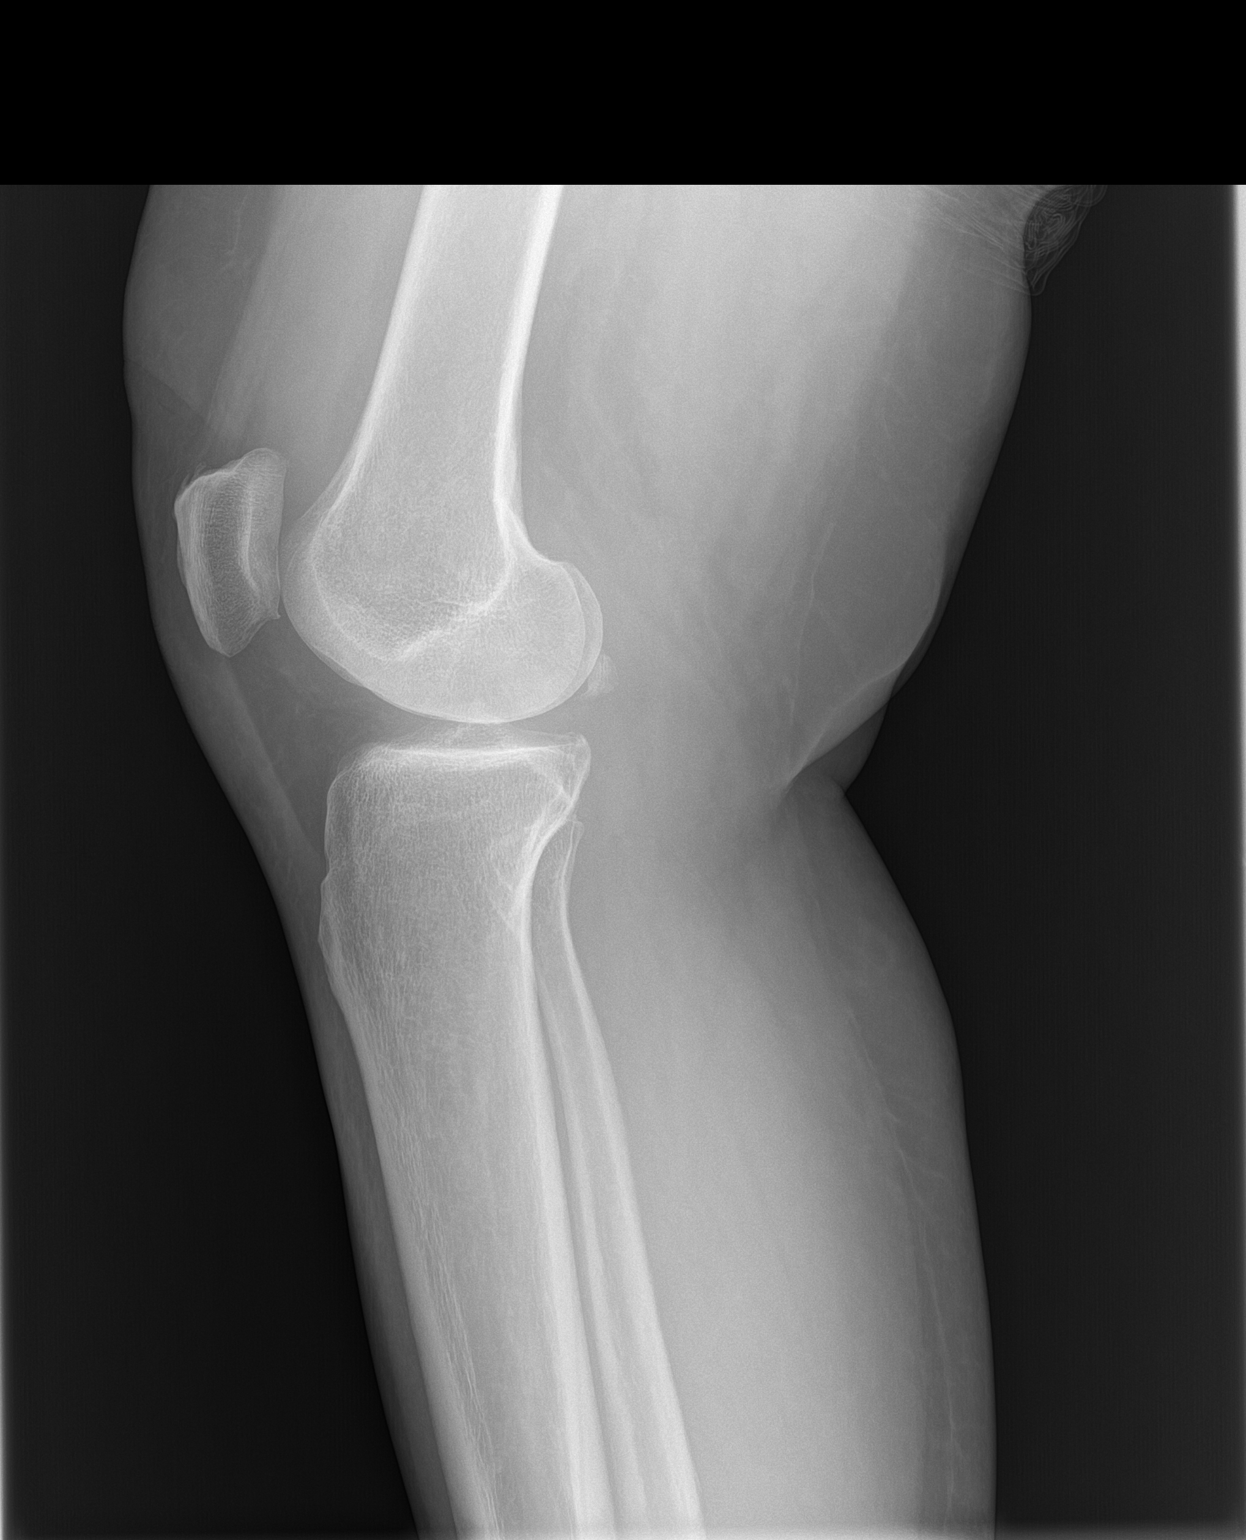

[patella]
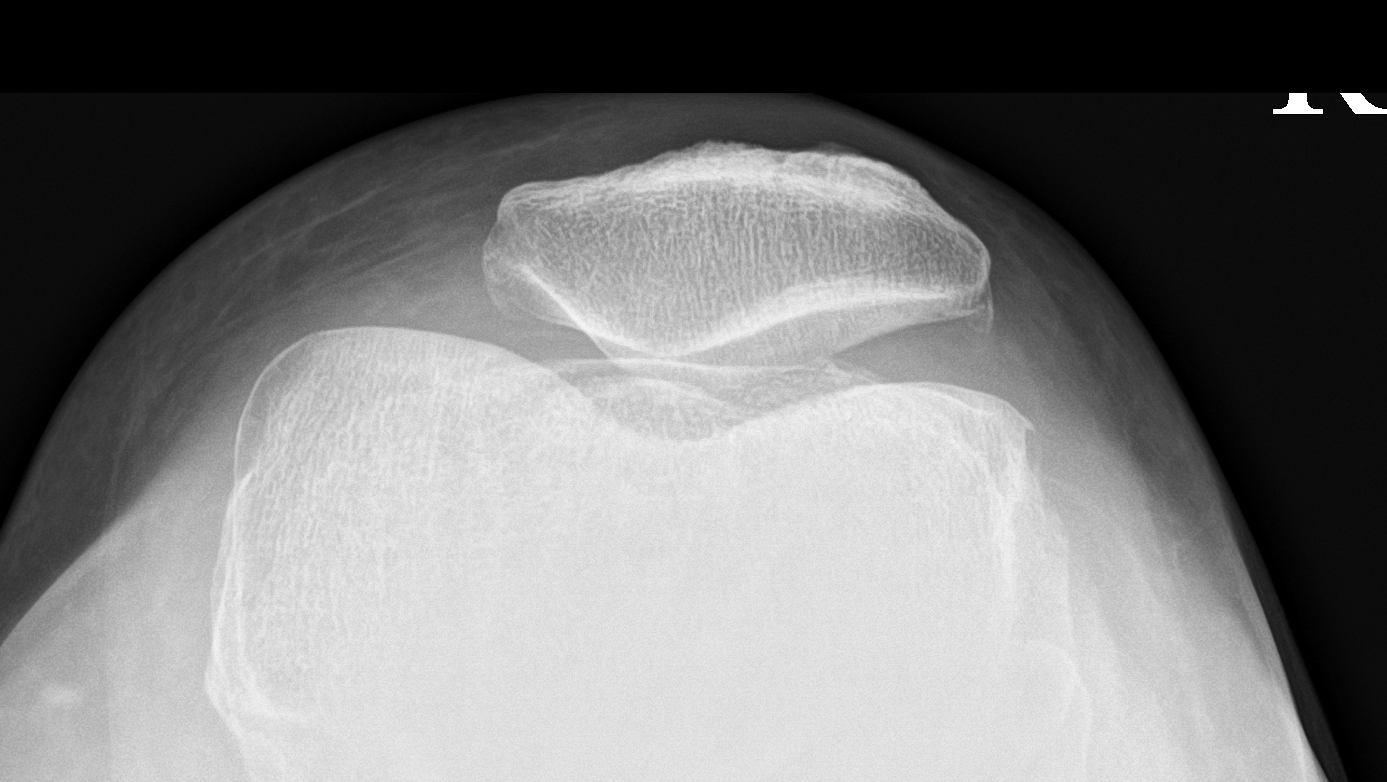

[3 of 3 positions shown; findings below may reference images not displayed]

FINDINGS: Mild osseous demineralization.

Joint spaces preserved.

RIGHT knee joint effusion present.

No acute fracture, dislocation, or bone destruction.
IMPRESSION: No acute osseous abnormalities.

RIGHT knee joint effusion present.

## 2022-01-04 ENCOUNTER — Encounter (INDEPENDENT_AMBULATORY_CARE_PROVIDER_SITE_OTHER): Payer: Self-pay | Admitting: Family Medicine

## 2022-01-04 ENCOUNTER — Ambulatory Visit (INDEPENDENT_AMBULATORY_CARE_PROVIDER_SITE_OTHER): Payer: Commercial Managed Care - HMO | Admitting: Family Medicine

## 2022-01-04 VITALS — BP 128/73 | HR 58 | Temp 98.2°F | Ht 64.0 in | Wt 187.0 lb

## 2022-01-04 DIAGNOSIS — E785 Hyperlipidemia, unspecified: Secondary | ICD-10-CM

## 2022-01-04 DIAGNOSIS — R7303 Prediabetes: Secondary | ICD-10-CM | POA: Diagnosis not present

## 2022-01-04 DIAGNOSIS — E559 Vitamin D deficiency, unspecified: Secondary | ICD-10-CM

## 2022-01-04 DIAGNOSIS — E669 Obesity, unspecified: Secondary | ICD-10-CM

## 2022-01-04 DIAGNOSIS — Z6832 Body mass index (BMI) 32.0-32.9, adult: Secondary | ICD-10-CM

## 2022-01-04 DIAGNOSIS — E7849 Other hyperlipidemia: Secondary | ICD-10-CM

## 2022-01-04 MED ORDER — VITAMIN D (ERGOCALCIFEROL) 1.25 MG (50000 UNIT) PO CAPS: 50000.0000 [IU] | ORAL_CAPSULE | ORAL | 0 refills | Status: DC

## 2022-01-04 NOTE — Progress Notes (Signed)
Office: [phone]  /  Fax: [phone]    Total lbs lost to date: 41 Total lbs lost since last in-office visit: +5     BP 128/73   Pulse (!) 58   Temp 98.2 F (36.8 C)   Ht 5\' 4"  (1.626 m)   Wt 187 lb (84.8 kg)   SpO2 99%   BMI 32.10 kg/m  She was weighed on the bioimpedance scale:  Body mass index is 32.1 kg/m.  General:  Alert, oriented and cooperative. Patient is in no acute distress.  Mental Status: Normal mood and affect. Normal behavior. Normal judgment and thought content.        Patient past medical history includes:   Past Medical History:  Diagnosis Date   Arthritis    low back   Back pain    Baker's cyst of knee, right    Constipation    GERD (gastroesophageal reflux disease)    Heart burn    Hyperlipidemia    Hypertension    IBS (irritable bowel syndrome)    Joint pain    Lactose intolerance    Muscle pain    Other fatigue    Palpitations    Pre-diabetes    PVC (premature ventricular contraction)    Ringing in ear    Sleep apnea    SOB (shortness of breath) on exertion    Vitamin D deficiency    History of Present Illness The patient presents with a history of obesity, prediabetes, vitamin D deficiency, and hyperlipidemia. They report recent weight gain of 5 pounds, which they attribute to stress eating and dining out more frequently than before. The patient acknowledges a decrease in motivation and adherence to their previous dietary plan, estimating their current effort at around 85%. They have experienced fluctuations in motivation and have noticed feeling more sluggish and bloated recently.  The patient has been managing their prediabetes and hyperlipidemia with medications, including Lipitor, which they have been taking every other day. They have also been taking vitamin D supplements for their deficiency. The patient has not experienced any significant changes in their blood sugar levels or cholesterol since their last appointment.  In  addition to their chronic conditions, the patient had a recent bout with COVID-19, which was followed by a severe pneumonia a month later. They believe that their lungs were weakened by the COVID-19 infection, leading to the pneumonia. The patient required oxygen therapy for over a month after being discharged from the hospital.  The patient is currently awaiting lab results to assess their blood sugar levels, cholesterol, kidney and liver function, and vitamin D levels.  Assessment & Plan Weight Management: Patient reports stress eating and decreased motivation for diet and exercise, resulting in a 5-pound weight gain. -Encouraged patient to continue with meal planning and to have healthy food options readily available at home to avoid eating out. -Reminded patient of the importance of maintaining a healthy lifestyle, especially with the upcoming holiday season. -Cat 2 eating plan  Prediabetes: Patient has a history of prediabetes, currently managed with lifestyle modifications. -Order labs to monitor blood glucose levels and ensure continued control.  Hyperlipidemia: Patient is currently on Lipitor (every other day). -Order labs to monitor cholesterol levels and assess the effectiveness of the current Lipitor regimen. -Plan to adjust Lipitor dosage based on lab results.  Vitamin D Deficiency: Patient is currently on Vitamin D supplementation. -Order labs to monitor Vitamin D levels. -Refill Vitamin D prescription.  Follow-up in 4 weeks.   [name]  Leafy Ro, MD

## 2022-01-05 LAB — CMP14+EGFR
ALT: 18 IU/L (ref 0–32)
AST: 16 IU/L (ref 0–40)
Albumin/Globulin Ratio: 1.9 (ref 1.2–2.2)
Albumin: 4.3 g/dL (ref 3.9–4.9)
Alkaline Phosphatase: 59 IU/L (ref 44–121)
BUN/Creatinine Ratio: 16 (ref 12–28)
BUN: 14 mg/dL (ref 8–27)
Bilirubin Total: 1.2 mg/dL (ref 0.0–1.2)
CO2: 27 mmol/L (ref 20–29)
Calcium: 9.6 mg/dL (ref 8.7–10.3)
Chloride: 102 mmol/L (ref 96–106)
Creatinine, Ser: 0.87 mg/dL (ref 0.57–1.00)
Globulin, Total: 2.3 g/dL (ref 1.5–4.5)
Glucose: 105 mg/dL — ABNORMAL HIGH (ref 70–99)
Potassium: 4 mmol/L (ref 3.5–5.2)
Sodium: 142 mmol/L (ref 134–144)
Total Protein: 6.6 g/dL (ref 6.0–8.5)
eGFR: 75 mL/min/{1.73_m2} (ref >59)

## 2022-01-05 LAB — CBC WITH DIFFERENTIAL/PLATELET
Basophils Absolute: 0.1 10*3/uL (ref 0.0–0.2)
Basos: 1 %
EOS (ABSOLUTE): 0.3 10*3/uL (ref 0.0–0.4)
Eos: 5 %
Hematocrit: 39.6 % (ref 34.0–46.6)
Hemoglobin: 13.2 g/dL (ref 11.1–15.9)
Immature Grans (Abs): 0 10*3/uL (ref 0.0–0.1)
Immature Granulocytes: 0 %
Lymphocytes Absolute: 1.7 10*3/uL (ref 0.7–3.1)
Lymphs: 29 %
MCH: 31.1 pg (ref 26.6–33.0)
MCHC: 33.3 g/dL (ref 31.5–35.7)
MCV: 93 fL (ref 79–97)
Monocytes Absolute: 0.5 10*3/uL (ref 0.1–0.9)
Monocytes: 9 %
Neutrophils Absolute: 3.3 10*3/uL (ref 1.4–7.0)
Neutrophils: 56 %
Platelets: 310 10*3/uL (ref 150–450)
RBC: 4.24 x10E6/uL (ref 3.77–5.28)
RDW: 11.8 % (ref 11.7–15.4)
WBC: 6 10*3/uL (ref 3.4–10.8)

## 2022-01-05 LAB — LIPID PANEL WITH LDL/HDL RATIO
Cholesterol, Total: 168 mg/dL (ref 100–199)
HDL: 58 mg/dL (ref >39)
LDL Chol Calc (NIH): 100 mg/dL — ABNORMAL HIGH (ref 0–99)
LDL/HDL Ratio: 1.7 ratio (ref 0.0–3.2)
Triglycerides: 46 mg/dL (ref 0–149)
VLDL Cholesterol Cal: 10 mg/dL (ref 5–40)

## 2022-01-05 LAB — INSULIN, RANDOM: INSULIN: 7.7 u[IU]/mL (ref 2.6–24.9)

## 2022-01-05 LAB — VITAMIN D 25 HYDROXY (VIT D DEFICIENCY, FRACTURES): Vit D, 25-Hydroxy: 55.9 ng/mL (ref 30.0–100.0)

## 2022-01-05 LAB — HEMOGLOBIN A1C
Est. average glucose Bld gHb Est-mCnc: 111 mg/dL
Hgb A1c MFr Bld: 5.5 % (ref 4.8–5.6)

## 2022-01-09 NOTE — Progress Notes (Unsigned)
NEW PATIENT Date of Service/Encounter:  01/10/22 Referring provider: Saintclair Halsted, FNP Primary care provider: Saintclair Halsted, FNP  Subjective:  [name] is a 61 y.o. female with a PMHx of hypertension, dyslipidemia, depression, obesity, vitamin D deficiency, PVCs presenting today for evaluation of asthma and allergies.  History obtained from: chart review and patient.   Chronic rhinitis Runny nose, sneezing and itchy eyes, lots of drainage Occurs year-round Treatments tried: azelastine nasal spray (not using), refresh tears, singulair (no longer taking, unclear if helped), zyrtec 10 mg daily-helps Previous allergy testing:  30 years ago, allergic to ragweed, dust and pollens History of reflux/heartburn: no She was on AIT around 30 years, completed 6 years then insurance stopped covering. Was helpful. She is not interested in AIT at this time. She has had sinus surgery years ago.   Shortness of breath: started about a year ago Symptoms include: occasionally will have SOB when doing exercises, at season changes she will occasionally wheeze at nighttime  0 daytime symptoms in past month, 0 nighttime awakenings in past month Using rescue inhaler 3-4 times per year, and it helps. She has a heart murmur and does not like using the albuterol because of this. -Limitations to daily activity: none - 0 ED visits, 0 UC visits and oral steroids in the past year - 1 number of lifetime hospitalizations, 0 number of lifetime intubations.  - Up-to-date with pneumonia,, Covid-19,, and Flu, vaccines. - History of prior COVID-19 infection: yes-2021. COVID Pneumonia requiring hospitalization for 5 days, on oxygen for 1.5 after this also used combivent for several months - Smoking exposure: no Previous Diagnostics:  - Prior PFTs or spirometry: none available for review - Most Recent AEC (01/04/22): 300 -Most Recent Chest Imaging: CXR on (04/12/21): Worsened aeration with progressive  bilateral interstitial opacities, most consistent with COVID-19 pneumonia.  Management:  - Current regimen:  - Maintenance: none - Rescue: Albuterol 2 puffs q4-6 hrs PRN, using prior to exercise if walking long distances and it helps. Also prescribed combivent.  She has had a cardiac work-up due to her heart murmur. Dr. Lovena Le and Dr. Radford Pax. She has spoken with them   Other allergy screening: Food allergy: no Medication allergy:  yes-morphine and derivate Hymenoptera allergy: no Urticaria:  occasionally with soaps and detergents Eczema:no History of recurrent infections suggestive of immunodeficency: no Vaccinations are up to date.   Past Medical History: Past Medical History:  Diagnosis Date   Arthritis    low back   Asthma    Back pain    Baker's cyst of knee, right    Constipation    GERD (gastroesophageal reflux disease)    Heart burn    Hyperlipidemia    Hypertension    IBS (irritable bowel syndrome)    Joint pain    Lactose intolerance    Muscle pain    Other fatigue    Palpitations    Pre-diabetes    PVC (premature ventricular contraction)    Ringing in ear    Sleep apnea    SOB (shortness of breath) on exertion    Urticaria    Vitamin D deficiency    Medication List:  Current Outpatient Medications  Medication Sig Dispense Refill   albuterol (VENTOLIN HFA) 108 (90 Base) MCG/ACT inhaler Inhale 2 puffs into the lungs every 6 (six) hours as needed for wheezing or shortness of breath. 1 each 0   aspirin 81 MG chewable tablet Chew 1 tablet (81 mg total) by mouth  daily.     atorvastatin (LIPITOR) 40 MG tablet Take 1 tablet (40 mg total) by mouth every other day. 30 tablet 0   azelastine (ASTELIN) 0.1 % nasal spray Place into both nostrils as needed. Use in each nostril as directed     carboxymethylcellulose (REFRESH PLUS) 0.5 % SOLN 1 drop daily as needed. 2 drops each eye     chlorthalidone (HYGROTON) 25 MG tablet Take 1 tablet (25 mg total) by mouth daily.  90 tablet 3   cyclobenzaprine (FLEXERIL) 10 MG tablet Take 10 mg by mouth 2 (two) times daily as needed.     estradiol (VIVELLE-DOT) 0.05 MG/24HR patch Place 1 patch onto the skin 2 (two) times a week.   0   famotidine (PEPCID) 20 MG tablet Take 1 tablet (20 mg total) by mouth daily. 90 tablet 3   flecainide (TAMBOCOR) 150 MG tablet Take 1/2 (one-half) tablet by mouth twice daily 90 tablet 2   HYDROcodone-acetaminophen (NORCO) 5-325 MG tablet Take 1 tablet by mouth every 6 (six) hours as needed. 20 tablet 0   Ipratropium-Albuterol (COMBIVENT RESPIMAT) 20-100 MCG/ACT AERS respimat Inhale 1 puff into the lungs every 6 (six) hours.     meloxicam (MOBIC) 15 MG tablet Take 15 mg by mouth daily.     metoprolol succinate (TOPROL-XL) 50 MG 24 hr tablet TAKE 1 TABLET BY MOUTH ONCE DAILY WITH  OR  IMMEDIATELY  FOLLOWING  A  MEAL 90 tablet 1   montelukast (SINGULAIR) 10 MG tablet Take 10 mg by mouth daily as needed.     potassium chloride SA (KLOR-CON M) 20 MEQ tablet Take 1 tablet (20 mEq total) by mouth daily. 90 tablet 3   Probiotic Product (PROBIOTIC-10 PO) Take by mouth as needed.     Vitamin D, Ergocalciferol, (DRISDOL) 1.25 MG (50000 UNIT) CAPS capsule Take 1 capsule (50,000 Units total) by mouth every 7 (seven) days. 4 capsule 0   No current facility-administered medications for this visit.   Known Allergies:  Allergies  Allergen Reactions   Buprenorphine Hcl Itching   Morphine And Related Itching   Morphine Sulfate Other (See Comments)   Other Other (See Comments)   Short Ragweed Pollen Ext Hives   Past Surgical History: Past Surgical History:  Procedure Laterality Date   APPENDECTOMY     carpal tunn     CESAREAN SECTION     KNEE ARTHROSCOPY WITH LATERAL MENISECTOMY Right 03/08/2021   Procedure: RIGHT KNEE PARTIAL LATERAL MENISCECTOMY;  Surgeon: Tarry Kos, MD;  Location: [facility];  Service: Orthopedics;  Laterality: Right;   NEUROMA SURGERY     SEPTOPLASTY      SINOSCOPY     TONSILLECTOMY     TOTAL ABDOMINAL HYSTERECTOMY W/ BILATERAL SALPINGOOPHORECTOMY     TUBAL LIGATION     Family History: Family History  Problem Relation Age of Onset   Sudden death Mother    Asthma Father    Hypertension Father    Stroke Father    Cancer Father    Alcoholism Father    Hyperlipidemia Sister    Hyperlipidemia Brother    Allergic rhinitis Maternal Grandmother    Eczema Neg Hx    Urticaria Neg Hx    Social History: Karyna lives in a house built 17 years ago, no water damage, wood flooring in the family room, carpet in the bedroom, electric heating, central AC, no pets, no cockroaches, using dust mite protection on the bedding but not pillows, no smoke exposure.  No HEPA filter.  [place]   ROS:  All other systems negative except as noted per HPI.  Objective:  Blood pressure 120/70, pulse 60, temperature 98 F (36.7 C), temperature source Temporal, resp. rate 16, height 5' 4.8" (1.646 m), weight 190 lb 12.8 oz (86.5 kg). Body mass index is 31.95 kg/m. Physical Exam:  General Appearance:  Alert, cooperative, no distress, appears stated age  Head:  Normocephalic, without obvious abnormality, atraumatic  Eyes:  Conjunctiva clear, EOM's intact  Nose: Nares normal, normal mucosa and no visible anterior polyps  Throat: Lips, tongue normal; teeth and gums normal, normal posterior oropharynx  Neck: Supple, symmetrical  Lungs:   clear to auscultation bilaterally, Respirations unlabored, no coughing  Heart:  regular rate and rhythm and no murmur, Appears well perfused  Extremities: No edema  Skin: Skin color, texture, turgor normal, no rashes or lesions on visualized portions of skin  Neurologic: No gross deficits     Diagnostics: Spirometry:  Tracings reviewed. Her effort: Good reproducible efforts. FVC: 2.74L  FEV1: 2.13L, 99% predicted  FEV1/FVC ratio: 0.78  Interpretation: Spirometry consistent with normal pattern    Skin Testing: Environmental allergy panel.  Adequate controls. Results discussed with patient/family.  Airborne Adult Perc - 01/10/22 1640     Panel 1 Select    1. Control-Buffer 50% Glycerol Negative    2. Control-Histamine 1 mg/ml 3+    3. Albumin saline Negative    4. Prescott Negative    5. [place] 3+    6. Johnson Negative    7. Jefferson Blue Negative    8. Meadow Fescue Negative    9. Perennial Rye Negative    10. Sweet Vernal Negative    11. Timothy Negative    12. Cocklebur 3+    13. Burweed Marshelder Negative    14. Ragweed, short 4+    15. Ragweed, Giant Negative    16. Plantain,  English 3+    17. Lamb's Quarters Negative    18. Sheep Sorrell Negative    19. Rough Pigweed Negative    20. Marsh Elder, Rough Negative    21. Mugwort, Common 2+    22. Ash mix 4+    23. Birch mix Negative    24. Beech American Negative    25. Box, Elder 2+    26. Cedar, red 3+    27. Cottonwood, Eastern 3+    28. Elm mix Negative    29. Hickory Negative    30. Maple mix Negative    31. Oak, [place] mix Negative    32. Pecan Pollen Negative    33. Pine mix Negative    34. Sycamore Eastern Negative    35. McKittrick, Black Pollen Negative    36. Alternaria alternata Negative    37. Cladosporium Herbarum Negative    38. Aspergillus mix Negative    39. Penicillium mix Negative    40. Bipolaris sorokiniana (Helminthosporium) Negative    41. Drechslera spicifera (Curvularia) Negative    42. Mucor plumbeus Negative    43. Fusarium moniliforme Negative    44. Aureobasidium pullulans (pullulara) Negative    45. Rhizopus oryzae Negative    46. Botrytis cinera Negative    47. Epicoccum nigrum Negative    48. Phoma betae Negative    49. Candida Albicans Negative    50. Trichophyton mentagrophytes Negative    51. Mite, D Farinae  5,000 AU/ml Negative    52. Mite, D Pteronyssinus  5,000 AU/ml Negative  53. Cat Hair 10,000 BAU/ml Negative    54.  Dog Epithelia Negative    55. Mixed  Feathers Negative    56. Horse Epithelia Negative    57. Cockroach, German Negative    58. Mouse Negative    59. Tobacco Leaf Negative             Allergy testing results were read and interpreted by myself, documented by clinical staff.  Assessment and Plan  Exercise induced bronchospasm: - your lung testing today looked good, but this does not rule out exercise induced bronchospasm.  She does have a history of COVID-pneumonia in 2021 requiring hospitalization and a brief.  Off supplemental oxygen.  Current symptoms started 1 year later.  Clinically feels improved with albuterol, but has a heart murmur and does not use often due to palpitations. - Rescue Inhaler: Xopenex (levalbuterol) 2 puffs. Use  every 4-6 hours as needed for chest tightness, wheezing, or coughing.  Can also use 15 minutes prior to exercise if you have symptoms with activity. Xopenex will have less cardiac symptoms than albuterol. - Asthma is not controlled if:  - Symptoms are occurring >2 times a week OR  - >2 times a month nighttime awakenings  - You are requiring systemic steroids (prednisone/steroid injections) more than once per year  - Your require hospitalization for your asthma.  - Please call the clinic to schedule a follow up if these symptoms arise Continue to follow with Cardiology as scheduled  Allergic Rhinitis - seasonal due to pollens: Has been controlled for years on Zyrtec daily.  Would like to try different antihistamine.  Symptoms are year-round.  Has previous sinus surgery years ago.  Also did AIT for 6 years around 30 years ago.  Not currently interested in AIT. - allergy testing today was positive to grass pollen, tree pollen and weed pollens including ragweeds - allergen avoidance as below - consider allergy shots as long term control of your symptoms by teaching your immune system to be more tolerant of your allergy triggers - Consider Nasal Steroid Spray: Options include Flonase  (fluticasone), Nasocort (triamcinolone), Nasonex (mometasome) 1- 2 sprays in each nostril daily (can buy over-the-counter if not covered by insurance)  Best results if used daily. - Start over the counter antihistamine daily or daily as needed.   -Your options include Zyrtec (Cetirizine) 10mg , Claritin (Loratadine) 10mg , Allegra (Fexofenadine) 180mg , or Xyzal (Levocetirinze) 5mg   Allergic Conjunctivitis:  - Consider Allergy Eye drops: great options include Pataday (Olopatadine) or Zaditor (ketotifen) for eye symptoms daily as needed-both sold over the counter if not covered by insurance.   -Avoid eye drops that say red eye relief as they may contain medications that dry out your eyes.  Follow-up in 6 months, sooner if needed.   This note in its entirety was forwarded to the Provider who requested this consultation.  Thank you for your kind referral. I appreciate the opportunity to take part in [place] care. Please do not hesitate to contact me with questions.  Sincerely,  Sigurd Sos, MD [facility]

## 2022-01-10 ENCOUNTER — Encounter: Payer: Self-pay | Admitting: Internal Medicine

## 2022-01-10 ENCOUNTER — Ambulatory Visit: Payer: Commercial Managed Care - HMO | Admitting: Internal Medicine

## 2022-01-10 VITALS — BP 120/70 | HR 60 | Temp 98.0°F | Resp 16 | Ht 64.8 in | Wt 190.8 lb

## 2022-01-10 DIAGNOSIS — J301 Allergic rhinitis due to pollen: Secondary | ICD-10-CM

## 2022-01-10 DIAGNOSIS — H1013 Acute atopic conjunctivitis, bilateral: Secondary | ICD-10-CM

## 2022-01-10 DIAGNOSIS — J45990 Exercise induced bronchospasm: Secondary | ICD-10-CM

## 2022-01-10 MED ORDER — LEVALBUTEROL TARTRATE 45 MCG/ACT IN AERO: 2.0000 | INHALATION_SPRAY | Freq: Four times a day (QID) | RESPIRATORY_TRACT | 5 refills | Status: AC | PRN

## 2022-01-10 NOTE — Patient Instructions (Addendum)
Exercise induced bronchospasm: - your lung testing today looked good - Rescue Inhaler: Xopenex (levalbuterol) 2 puffs. Use  every 4-6 hours as needed for chest tightness, wheezing, or coughing.  Can also use 15 minutes prior to exercise if you have symptoms with activity. Xopenex will have less cardiac symptoms than albuterol. - Asthma is not controlled if:  - Symptoms are occurring >2 times a week OR  - >2 times a month nighttime awakenings  - You are requiring systemic steroids (prednisone/steroid injections) more than once per year  - Your require hospitalization for your asthma.  - Please call the clinic to schedule a follow up if these symptoms arise Continue to follow with Cardiology as scheduled  Allergic Rhinitis - seasonal due to pollens: - allergy testing today was positive to grass pollen, tree pollen and weed pollens including ragweeds - allergen avoidance as below - consider allergy shots as long term control of your symptoms by teaching your immune system to be more tolerant of your allergy triggers - Consider Nasal Steroid Spray: Options include Flonase (fluticasone), Nasocort (triamcinolone), Nasonex (mometasome) 1- 2 sprays in each nostril daily (can buy over-the-counter if not covered by insurance)  Best results if used daily. - Start over the counter antihistamine daily or daily as needed.   -Your options include Zyrtec (Cetirizine) 10mg , Claritin (Loratadine) 10mg , Allegra (Fexofenadine) 180mg , or Xyzal (Levocetirinze) 5mg   Allergic Conjunctivitis:  - Consider Allergy Eye drops: great options include Pataday (Olopatadine) or Zaditor (ketotifen) for eye symptoms daily as needed-both sold over the counter if not covered by insurance.   -Avoid eye drops that say red eye relief as they may contain medications that dry out your eyes.  Follow-up in 6 months, sooner if needed.  It was wonderful meeting you today! Thank you for letting me participate in your care. Sigurd Sos,  MD [facility]   Reducing Pollen Exposure  The American Academy of Allergy, Asthma and Immunology suggests the following steps to reduce your exposure to pollen during allergy seasons.    Do not hang sheets or clothing out to dry; pollen may collect on these items. Do not mow lawns or spend time around freshly cut grass; mowing stirs up pollen. Keep windows closed at night.  Keep car windows closed while driving. Minimize morning activities outdoors, a time when pollen counts are usually at their highest. Stay indoors as much as possible when pollen counts or humidity is high and on windy days when pollen tends to remain in the air longer. Use air conditioning when possible.  Many air conditioners have filters that trap the pollen spores. Use a HEPA room air filter to remove pollen form the indoor air you breathe.

## 2022-01-11 NOTE — Addendum Note (Signed)
Addended by: Chip Boer R on: 01/11/2022 07:20 PM   Modules accepted: Orders

## 2022-01-11 NOTE — Addendum Note (Signed)
Addended by: Chip Boer R on: 01/11/2022 07:18 PM   Modules accepted: Orders

## 2022-02-01 ENCOUNTER — Ambulatory Visit (INDEPENDENT_AMBULATORY_CARE_PROVIDER_SITE_OTHER): Payer: Commercial Managed Care - HMO | Admitting: Family Medicine

## 2022-02-01 ENCOUNTER — Encounter (INDEPENDENT_AMBULATORY_CARE_PROVIDER_SITE_OTHER): Payer: Self-pay | Admitting: Family Medicine

## 2022-02-01 VITALS — BP 130/70 | HR 55 | Temp 97.9°F | Ht 64.0 in | Wt 184.0 lb

## 2022-02-01 DIAGNOSIS — E669 Obesity, unspecified: Secondary | ICD-10-CM

## 2022-02-01 DIAGNOSIS — E559 Vitamin D deficiency, unspecified: Secondary | ICD-10-CM | POA: Diagnosis not present

## 2022-02-01 DIAGNOSIS — Z6831 Body mass index (BMI) 31.0-31.9, adult: Secondary | ICD-10-CM

## 2022-02-01 MED ORDER — VITAMIN D (ERGOCALCIFEROL) 1.25 MG (50000 UNIT) PO CAPS: 50000.0000 [IU] | ORAL_CAPSULE | ORAL | 0 refills | Status: DC

## 2022-02-13 NOTE — Progress Notes (Signed)
Chief Complaint:   OBESITY [name] is here to discuss her progress with her obesity treatment plan along with follow-up of her obesity related diagnoses. [name] is on the Category 2 Plan and states she is following her eating plan approximately 80-85% of the time. [name] states she is bike riding and doing cardio for 30 minutes 4 times per week.  Today's visit was #: 24 Starting weight: 228 lbs Starting date: 07/20/2020 Today's weight: 184 lbs Today's date: 02/01/2022 Total lbs lost to date: 44 Total lbs lost since last in-office visit: 3  Interim History: [name] continues to do well with weight loss.  She is portion controlling her indulgences.  Her hunger is mostly controlled.  She notes some increase in cravings and temptations.  She is hosting Thanksgiving and she is making eating strategies.  Subjective:   1. Vitamin D deficiency [name] stable on vitamin D, and she requests a refill today.  She denies nausea, vomiting, or muscle weakness.  Assessment/Plan:   1. Vitamin D deficiency We will refill prescription Vitamin D for 1 month. [name] will follow-up for routine testing of Vitamin D, at least 2-3 times per year to avoid over-replacement.  - Vitamin D, Ergocalciferol, (DRISDOL) 1.25 MG (50000 UNIT) CAPS capsule; Take 1 capsule (50,000 Units total) by mouth every 7 (seven) days.  Dispense: 4 capsule; Refill: 0  2. Obesity, Current BMI 31.6 [name] is currently in the action stage of change. As such, her goal is to continue with weight loss efforts. She has agreed to the Category 2 Plan.   Exercise goals: As is.   Behavioral modification strategies: increasing lean protein intake and holiday eating strategies .  [name] has agreed to follow-up with our clinic in 4 weeks. She was informed of the importance of frequent follow-up visits to maximize her success with intensive lifestyle modifications for her multiple health conditions.   Objective:   Blood pressure 130/70,  pulse (!) 55, temperature 97.9 F (36.6 C), height 5\' 4"  (1.626 m), weight 184 lb (83.5 kg), SpO2 99 %. Body mass index is 31.58 kg/m.  General: Cooperative, alert, well developed, in no acute distress. HEENT: Conjunctivae and lids unremarkable. Cardiovascular: Regular rhythm.  Lungs: Normal work of breathing. Neurologic: No focal deficits.   Lab Results  Component Value Date   CREATININE 0.87 01/04/2022   BUN 14 01/04/2022   NA 142 01/04/2022   K 4.0 01/04/2022   CL 102 01/04/2022   CO2 27 01/04/2022   Lab Results  Component Value Date   ALT 18 01/04/2022   AST 16 01/04/2022   ALKPHOS 59 01/04/2022   BILITOT 1.2 01/04/2022   Lab Results  Component Value Date   HGBA1C 5.5 01/04/2022   HGBA1C 5.5 09/26/2021   HGBA1C 5.6 02/16/2021   HGBA1C 6.0 (H) 07/20/2020   Lab Results  Component Value Date   INSULIN 7.7 01/04/2022   INSULIN 2.5 (L) 09/26/2021   INSULIN 5.9 02/16/2021   INSULIN 17.8 07/20/2020   Lab Results  Component Value Date   TSH 1.680 11/02/2020   Lab Results  Component Value Date   CHOL 168 01/04/2022   HDL 58 01/04/2022   LDLCALC 100 (H) 01/04/2022   TRIG 46 01/04/2022   CHOLHDL 3.3 12/23/2018   Lab Results  Component Value Date   VD25OH 55.9 01/04/2022   VD25OH 50.4 09/26/2021   VD25OH 63.7 02/16/2021   Lab Results  Component Value Date   WBC 6.0 01/04/2022   HGB 13.2 01/04/2022  HCT 39.6 01/04/2022   MCV 93 01/04/2022   PLT 310 01/04/2022   Lab Results  Component Value Date   FERRITIN 1,561 (H) 04/13/2019   Attestation Statements:   Reviewed by clinician on day of visit: allergies, medications, problem list, medical history, surgical history, family history, social history, and previous encounter notes.  Time spent on visit including pre-visit chart review and post-visit care and charting was 30 minutes.   I, Burt Knack, am acting as transcriptionist for Quillian Quince, MD.  I have reviewed the above documentation for  accuracy and completeness, and I agree with the above. -  Quillian Quince, MD

## 2022-03-01 ENCOUNTER — Ambulatory Visit (INDEPENDENT_AMBULATORY_CARE_PROVIDER_SITE_OTHER): Payer: Commercial Managed Care - HMO | Admitting: Family Medicine

## 2022-03-01 ENCOUNTER — Encounter (INDEPENDENT_AMBULATORY_CARE_PROVIDER_SITE_OTHER): Payer: Self-pay | Admitting: Family Medicine

## 2022-03-01 ENCOUNTER — Ambulatory Visit (INDEPENDENT_AMBULATORY_CARE_PROVIDER_SITE_OTHER): Payer: Commercial Managed Care - HMO

## 2022-03-01 ENCOUNTER — Ambulatory Visit: Payer: Commercial Managed Care - HMO | Attending: Internal Medicine | Admitting: Internal Medicine

## 2022-03-01 ENCOUNTER — Encounter: Payer: Self-pay | Admitting: Internal Medicine

## 2022-03-01 VITALS — BP 130/84 | HR 67 | Ht 64.0 in | Wt 189.0 lb

## 2022-03-01 VITALS — BP 126/73 | HR 58 | Temp 98.0°F | Ht 64.0 in | Wt 186.0 lb

## 2022-03-01 DIAGNOSIS — E669 Obesity, unspecified: Secondary | ICD-10-CM | POA: Diagnosis not present

## 2022-03-01 DIAGNOSIS — R002 Palpitations: Secondary | ICD-10-CM

## 2022-03-01 DIAGNOSIS — E7849 Other hyperlipidemia: Secondary | ICD-10-CM

## 2022-03-01 DIAGNOSIS — G4733 Obstructive sleep apnea (adult) (pediatric): Secondary | ICD-10-CM | POA: Diagnosis not present

## 2022-03-01 DIAGNOSIS — E559 Vitamin D deficiency, unspecified: Secondary | ICD-10-CM | POA: Diagnosis not present

## 2022-03-01 DIAGNOSIS — I493 Ventricular premature depolarization: Secondary | ICD-10-CM | POA: Diagnosis not present

## 2022-03-01 DIAGNOSIS — Z6831 Body mass index (BMI) 31.0-31.9, adult: Secondary | ICD-10-CM

## 2022-03-01 MED ORDER — ATORVASTATIN CALCIUM 40 MG PO TABS: 40.0000 mg | ORAL_TABLET | ORAL | 0 refills | Status: DC

## 2022-03-01 MED ORDER — VITAMIN D (ERGOCALCIFEROL) 1.25 MG (50000 UNIT) PO CAPS: 50000.0000 [IU] | ORAL_CAPSULE | ORAL | 0 refills | Status: DC

## 2022-03-01 NOTE — Patient Instructions (Signed)
Medication Instructions:  NO CHANGES *If you need a refill on your cardiac medications before your next appointment, please call your pharmacy*   Lab Work: NONE If you have labs (blood work) drawn today and your tests are completely normal, you will receive your results only by: MyChart Message (if you have MyChart) OR A paper copy in the mail If you have any lab test that is abnormal or we need to change your treatment, we will call you to review the results.   Testing/Procedures: [name]- Long Term Monitor Instructions   3 DAY   Your physician has requested you wear a ZIO patch monitor for 14 days.  This is a single patch monitor. [employer] supplies one patch monitor per enrollment. Additional stickers are not available. Please do not apply patch if you will be having a Nuclear Stress Test,  Echocardiogram, Cardiac CT, MRI, or Chest Xray during the period you would be wearing the  monitor. The patch cannot be worn during these tests. You cannot remove and re-apply the  ZIO XT patch monitor.  Your ZIO patch monitor will be mailed 3 day USPS to your address on file. It may take 3-5 days  to receive your monitor after you have been enrolled.  Once you have received your monitor, please review the enclosed instructions. Your monitor  has already been registered assigning a specific monitor serial # to you.  Billing and Patient Assistance Program Information  We have supplied [employer] with any of your insurance information on file for billing purposes. [employer] offers a sliding scale Patient Assistance Program for patients that do not have  insurance, or whose insurance does not completely cover the cost of the ZIO monitor.  You must apply for the Patient Assistance Program to qualify for this discounted rate.  To apply, please call [employer] at [phone], select option 4, select option 2, ask to apply for  Patient Assistance Program. [name] will ask your household income, and how many  people  are in your household. They will quote your out-of-pocket cost based on that information.  [employer] will also be able to set up a 72-month, interest-free payment plan if needed.  Applying the monitor   Shave hair from upper left chest.  Hold abrader disc by orange tab. Rub abrader in 40 strokes over the upper left chest as  indicated in your monitor instructions.  Clean area with 4 enclosed alcohol pads. Let dry.  Apply patch as indicated in monitor instructions. Patch will be placed under collarbone on left  side of chest with arrow pointing upward.  Rub patch adhesive wings for 2 minutes. Remove white label marked "1". Remove the white  label marked "2". Rub patch adhesive wings for 2 additional minutes.  While looking in a mirror, press and release button in center of patch. A small green light will  flash 3-4 times. This will be your only indicator that the monitor has been turned on.  Do not shower for the first 24 hours. You may shower after the first 24 hours.  Press the button if you feel a symptom. You will hear a small click. Record Date, Time and  Symptom in the Patient Logbook.  When you are ready to remove the patch, follow instructions on the last 2 pages of Patient  Logbook. Stick patch monitor onto the last page of Patient Logbook.  Place Patient Logbook in the blue and white box. Use locking tab on box and tape box closed  securely. The  blue and white box has prepaid postage on it. Please place it in the mailbox as  soon as possible. Your physician should have your test results approximately 7 days after the  monitor has been mailed back to [facility].  Call [facility] Customer Care at [phone] if you have questions regarding  your ZIO XT patch monitor. Call them immediately if you see an orange light blinking on your  monitor.  If your monitor falls off in less than 4 days, contact our Monitor department at [phone].  If your monitor becomes  loose or falls off after 4 days call [employer] at [phone] for  suggestions on securing your monitor    Follow-Up: At [facility], you and your health needs are our priority.  As part of our continuing mission to provide you with exceptional heart care, we have created designated Provider Care Teams.  These Care Teams include your primary Cardiologist (physician) and Advanced Practice Providers (APPs -  Physician Assistants and Nurse Practitioners) who all work together to provide you with the care you need, when you need it.  We recommend signing up for the patient portal called "MyChart".  Sign up information is provided on this After Visit Summary.  MyChart is used to connect with patients for Virtual Visits (Telemedicine).  Patients are able to view lab/test results, encounter notes, upcoming appointments, etc.  Non-urgent messages can be sent to your provider as well.   To learn more about what you can do with MyChart, go to [email or web address].    Your next appointment:    PENDING MONITOR RESULTS   The format for your next appointment:     Provider:       Other Instructions   Important Information About Sugar

## 2022-03-01 NOTE — Progress Notes (Unsigned)
Enrolled for [employer] to mail a ZIO XT long term holter monitor to the patients address on file.  

## 2022-03-01 NOTE — Progress Notes (Signed)
HPI  [name] returns today for ongoing evaluation and management of her PVC's. She is a pleasant 61 yo woman with above who has had nice control of her symptoms on flecainide and toprol. In the interim, she has had occaisional break throughs of PVC's. She denies chest pain or sob. She is no longer on eliquis. Her weight is unchanged since her last visit. She notes occaisions where she feels like her heart beating irregularly.     Current Outpatient Medications  Medication Sig Dispense Refill   aspirin 81 MG chewable tablet Chew 1 tablet (81 mg total) by mouth daily.     atorvastatin (LIPITOR) 40 MG tablet Take 1 tablet (40 mg total) by mouth every other day. 30 tablet 0   azelastine (ASTELIN) 0.1 % nasal spray Place into both nostrils as needed. Use in each nostril as directed     carboxymethylcellulose (REFRESH PLUS) 0.5 % SOLN 1 drop daily as needed. 2 drops each eye     chlorthalidone (HYGROTON) 25 MG tablet Take 1 tablet (25 mg total) by mouth daily. 90 tablet 3   cyclobenzaprine (FLEXERIL) 10 MG tablet Take 10 mg by mouth 2 (two) times daily as needed.     estradiol (VIVELLE-DOT) 0.05 MG/24HR patch Place 1 patch onto the skin 2 (two) times a week.   0   famotidine (PEPCID) 20 MG tablet Take 1 tablet (20 mg total) by mouth daily. 90 tablet 3   flecainide (TAMBOCOR) 150 MG tablet Take 1/2 (one-half) tablet by mouth twice daily 90 tablet 2   HYDROcodone-acetaminophen (NORCO) 5-325 MG tablet Take 1 tablet by mouth every 6 (six) hours as needed. 20 tablet 0   Ipratropium-Albuterol (COMBIVENT RESPIMAT) 20-100 MCG/ACT AERS respimat Inhale 1 puff into the lungs every 6 (six) hours.     levalbuterol (XOPENEX HFA) 45 MCG/ACT inhaler Inhale 2 puffs into the lungs every 6 (six) hours as needed for wheezing. 1 each 5   meloxicam (MOBIC) 15 MG tablet Take 15 mg by mouth daily.     metoprolol succinate (TOPROL-XL) 50 MG 24 hr tablet TAKE 1 TABLET BY MOUTH ONCE DAILY WITH  OR  IMMEDIATELY   FOLLOWING  A  MEAL 90 tablet 1   montelukast (SINGULAIR) 10 MG tablet Take 10 mg by mouth daily as needed.     potassium chloride SA (KLOR-CON M) 20 MEQ tablet Take 1 tablet (20 mEq total) by mouth daily. 90 tablet 3   Probiotic Product (PROBIOTIC-10 PO) Take by mouth as needed.     Vitamin D, Ergocalciferol, (DRISDOL) 1.25 MG (50000 UNIT) CAPS capsule Take 1 capsule (50,000 Units total) by mouth every 7 (seven) days. 4 capsule 0   No current facility-administered medications for this visit.     Past Medical History:  Diagnosis Date   Arthritis    low back   Asthma    Back pain    Baker's cyst of knee, right    Constipation    GERD (gastroesophageal reflux disease)    Heart burn    Hyperlipidemia    Hypertension    IBS (irritable bowel syndrome)    Joint pain    Lactose intolerance    Muscle pain    Other fatigue    Palpitations    Pre-diabetes    PVC (premature ventricular contraction)    Ringing in ear    Sleep apnea    SOB (shortness of breath) on exertion    Urticaria    Vitamin  D deficiency     ROS:   All systems reviewed and negative except as noted in the HPI.   Past Surgical History:  Procedure Laterality Date   APPENDECTOMY     carpal tunn     CESAREAN SECTION     KNEE ARTHROSCOPY WITH LATERAL MENISECTOMY Right 03/08/2021   Procedure: RIGHT KNEE PARTIAL LATERAL MENISCECTOMY;  Surgeon: Tarry Kos, MD;  Location: [facility];  Service: Orthopedics;  Laterality: Right;   NEUROMA SURGERY     SEPTOPLASTY     SINOSCOPY     TONSILLECTOMY     TOTAL ABDOMINAL HYSTERECTOMY W/ BILATERAL SALPINGOOPHORECTOMY     TUBAL LIGATION       Family History  Problem Relation Age of Onset   Sudden death Mother    Asthma Father    Hypertension Father    Stroke Father    Cancer Father    Alcoholism Father    Hyperlipidemia Sister    Hyperlipidemia Brother    Allergic rhinitis Maternal Grandmother    Eczema Neg Hx    Urticaria Neg Hx       Social History   Socioeconomic History   Marital status: Married    Spouse name: Not on file   Number of children: Not on file   Years of education: Not on file   Highest education level: Not on file  Occupational History   Occupation: Stay at home spouse  Tobacco Use   Smoking status: Never   Smokeless tobacco: Never  Vaping Use   Vaping Use: Never used  Substance and Sexual Activity   Alcohol use: Not Currently   Drug use: Never   Sexual activity: Not on file  Other Topics Concern   Not on file  Social History Narrative   Tobacco use   Cigarettes: Never smoked   Tobacco history last updated 11/03/2013   No smoking   No alcohol    Yes caffeine   Recreational drug use  : no   Diet : regular    Exercise, Minimal   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   Social Determinants of Health   Financial Resource Strain: Not on file  Food Insecurity: Not on file  Transportation Needs: Not on file  Physical Activity: Not on file  Stress: Not on file  Social Connections: Not on file  Intimate Partner Violence: Not on file     BP 130/84   Pulse 67   Ht 5\' 4"  (1.626 m)   Wt 189 lb (85.7 kg)   SpO2 98%   BMI 32.44 kg/m   Physical Exam:  Well appearing NAD HEENT: Unremarkable Neck:  No JVD, no thyromegally Lymphatics:  No adenopathy Back:  No CVA tenderness Lungs:  Clear with no wheezes HEART:  Regular rate rhythm, no murmurs, no rubs, no clicks Abd:  soft, positive bowel sounds, no organomegally, no rebound, no guarding Ext:  2 plus pulses, no edema, no cyanosis, no clubbing Skin:  No rashes no nodules Neuro:  CN II through XII intact, motor grossly intact  EKG - nsr with no pvc's  Assess/Plan: 1. PVC' s - she has had an increase in her symptoms. She will continue flecainide and metoprolol. I asked her to wear a 3 day zio monitor so that we can  understand what her PVC burden is since we had  her on 75 mg of flecainide bid. 2. Obesity - I encouraged her to lose weight.  3. HTN -her bp is controlled. 4. Dyslipidemia - she will continue lipitor.      Sharlot Gowda [name],MD

## 2022-03-02 DIAGNOSIS — I493 Ventricular premature depolarization: Secondary | ICD-10-CM | POA: Diagnosis not present

## 2022-03-02 DIAGNOSIS — R002 Palpitations: Secondary | ICD-10-CM

## 2022-03-14 ENCOUNTER — Other Ambulatory Visit: Payer: Self-pay | Admitting: Internal Medicine

## 2022-03-15 NOTE — Progress Notes (Signed)
Chief Complaint:   OBESITY [name] is here to discuss her progress with her obesity treatment plan along with follow-up of her obesity related diagnoses. [name] is on the Category 2 Plan and states she is following her eating plan approximately 75% of the time. [name] states she is exercising for 20-30 minutes 2 times per week.  Today's visit was #: 25 Starting weight: 228 lbs Starting date: 07/20/2020 Today's weight: 186 lbs Today's date: 03/01/2022 Total lbs lost to date: 42 Total lbs lost since last in-office visit: 0  Interim History: [name] has done well with minimizing holiday weight gain over Thanksgiving. She is trying to decrease PM snacking and portion control holiday treats.   Subjective:   1. Other hyperlipidemia [name] is on Lipitor, and she is working on her weight loss to improve her cholesterol.  She needs a refill today.  2. Vitamin D deficiency [name] is on Vitamin D, and her level is at goal with no side effects noted. She requests a refill today.   3. Obstructive sleep apnea [name] saw Cardiology for elevated PVC's that are symptomatic and possibly worsening. She will wear a Ziopatch and she is working on wearing her CPAP but she is struggling.    Assessment/Plan:   1. Other hyperlipidemia We will refill Lipitor 40 mg every other day for 1 month. [name] will continue to work on diet, exercise and weight loss efforts. Orders and follow up as documented in patient record.   - atorvastatin (LIPITOR) 40 MG tablet; Take 1 tablet (40 mg total) by mouth every other day.  Dispense: 30 tablet; Refill: 0  2. Vitamin D deficiency We will refill prescription Vitamin D for 1 month. [name] will follow-up for routine testing of Vitamin D, at least 2-3 times per year to avoid over-replacement.  - Vitamin D, Ergocalciferol, (DRISDOL) 1.25 MG (50000 UNIT) CAPS capsule; Take 1 capsule (50,000 Units total) by mouth every 7 (seven) days.  Dispense: 4 capsule; Refill: 0  3.  Obstructive sleep apnea [name] was encouraged to try a new mask for her CPAP and reinforced the importance of good OSA controlled to decrease her risk. Weight loss is also important and she will continue to work on her diet and exercise as discussed.   4. Obesity, Current BMI 31.9 [name] is currently in the action stage of change. As such, her goal is to maintain weight for now. She has agreed to the Category 2 Plan.   [name]'s goal is to maintain her weight over Christmas and will then get back to a structured eating plan.  Exercise goals: As is.   Behavioral modification strategies: increasing lean protein intake and holiday eating strategies .  [name] has agreed to follow-up with our clinic in 4 to 5 weeks. She was informed of the importance of frequent follow-up visits to maximize her success with intensive lifestyle modifications for her multiple health conditions.   Objective:   Blood pressure 126/73, pulse (!) 58, temperature 98 F (36.7 C), height 5\' 4"  (1.626 m), weight 186 lb (84.4 kg), SpO2 98 %. Body mass index is 31.93 kg/m.  General: Cooperative, alert, well developed, in no acute distress. HEENT: Conjunctivae and lids unremarkable. Cardiovascular: Regular rhythm.  Lungs: Normal work of breathing. Neurologic: No focal deficits.   Lab Results  Component Value Date   CREATININE 0.87 01/04/2022   BUN 14 01/04/2022   NA 142 01/04/2022   K 4.0 01/04/2022   CL 102 01/04/2022   CO2 27 01/04/2022  Lab Results  Component Value Date   ALT 18 01/04/2022   AST 16 01/04/2022   ALKPHOS 59 01/04/2022   BILITOT 1.2 01/04/2022   Lab Results  Component Value Date   HGBA1C 5.5 01/04/2022   HGBA1C 5.5 09/26/2021   HGBA1C 5.6 02/16/2021   HGBA1C 6.0 (H) 07/20/2020   Lab Results  Component Value Date   INSULIN 7.7 01/04/2022   INSULIN 2.5 (L) 09/26/2021   INSULIN 5.9 02/16/2021   INSULIN 17.8 07/20/2020   Lab Results  Component Value Date   TSH 1.680 11/02/2020    Lab Results  Component Value Date   CHOL 168 01/04/2022   HDL 58 01/04/2022   LDLCALC 100 (H) 01/04/2022   TRIG 46 01/04/2022   CHOLHDL 3.3 12/23/2018   Lab Results  Component Value Date   VD25OH 55.9 01/04/2022   VD25OH 50.4 09/26/2021   VD25OH 63.7 02/16/2021   Lab Results  Component Value Date   WBC 6.0 01/04/2022   HGB 13.2 01/04/2022   HCT 39.6 01/04/2022   MCV 93 01/04/2022   PLT 310 01/04/2022   Lab Results  Component Value Date   FERRITIN 1,561 (H) 04/13/2019   Attestation Statements:   Reviewed by clinician on day of visit: allergies, medications, problem list, medical history, surgical history, family history, social history, and previous encounter notes.   I, Burt Knack, am acting as transcriptionist for Quillian Quince, MD.  I have reviewed the above documentation for accuracy and completeness, and I agree with the above. -  Quillian Quince, MD

## 2022-03-26 ENCOUNTER — Telehealth: Payer: Self-pay

## 2022-03-26 NOTE — Telephone Encounter (Signed)
-----   Message from Evans Lance, MD sent at 03/23/2022  5:51 PM EST ----- Reassuring heart monitor.

## 2022-04-10 ENCOUNTER — Encounter (INDEPENDENT_AMBULATORY_CARE_PROVIDER_SITE_OTHER): Payer: Self-pay | Admitting: Family Medicine

## 2022-04-10 ENCOUNTER — Ambulatory Visit (INDEPENDENT_AMBULATORY_CARE_PROVIDER_SITE_OTHER): Payer: Commercial Managed Care - HMO | Admitting: Family Medicine

## 2022-04-10 VITALS — BP 118/69 | HR 55 | Temp 97.8°F | Ht 64.0 in | Wt 189.0 lb

## 2022-04-10 DIAGNOSIS — E669 Obesity, unspecified: Secondary | ICD-10-CM | POA: Diagnosis not present

## 2022-04-10 DIAGNOSIS — R519 Headache, unspecified: Secondary | ICD-10-CM | POA: Diagnosis not present

## 2022-04-10 DIAGNOSIS — Z6832 Body mass index (BMI) 32.0-32.9, adult: Secondary | ICD-10-CM

## 2022-04-10 DIAGNOSIS — E559 Vitamin D deficiency, unspecified: Secondary | ICD-10-CM | POA: Diagnosis not present

## 2022-04-10 MED ORDER — VITAMIN D (ERGOCALCIFEROL) 1.25 MG (50000 UNIT) PO CAPS: 50000.0000 [IU] | ORAL_CAPSULE | ORAL | 0 refills | Status: DC

## 2022-04-25 NOTE — Progress Notes (Signed)
Chief Complaint:   OBESITY [name] is here to discuss her progress with her obesity treatment plan along with follow-up of her obesity related diagnoses. [name] is on the Category 2 Plan and states she is following her eating plan approximately 50% of the time. [name] states she is riding the stationary bike for 20-30 minutes 2-3 times per week.  Today's visit was #: 26 Starting weight: 228 lbs Starting date: 07/20/2020 Today's weight: 189 lbs Today's date: 04/10/2022 Total lbs lost to date: 39 Total lbs lost since last in-office visit: 0  Interim History: [name] did some celebration eating over the holidays. She is working on getting back on track with her weight loss efforts and she is open to discussing various options.   Subjective:   1. Vitamin D deficiency [name] is on Vitamin D, and she is doing well with remembering to take it regularly.   2. Nonintractable episodic headache, unspecified headache type [name] notes an increase in headaches the last few days. Her blood pressure is stable. She wonders if it is related to allergies.   Assessment/Plan:   1. Vitamin D deficiency We will refill prescription Vitamin D for 1 month. Jawana will follow-up for routine testing of Vitamin D, at least 2-3 times per year to avoid over-replacement.  - Vitamin D, Ergocalciferol, (DRISDOL) 1.25 MG (50000 UNIT) CAPS capsule; Take 1 capsule (50,000 Units total) by mouth every 7 (seven) days.  Dispense: 4 capsule; Refill: 0  2. Nonintractable episodic headache, unspecified headache type [name] was advised to increase her water intake and use a cold vaporizer in her bedroom in the Winter. We will follow up at her next visit in 3 weeks.   3. BMI 32.0-32.9,adult  4. Obesity, Beginning BMI 37.94 [name] is currently in the action stage of change. As such, her goal is to continue with weight loss efforts. She has agreed to change to following a lower carbohydrate, vegetable and lean protein rich  diet plan.   Exercise goals: As is.   Behavioral modification strategies: increasing lean protein intake.  [name] has agreed to follow-up with our clinic in 3 weeks. She was informed of the importance of frequent follow-up visits to maximize her success with intensive lifestyle modifications for her multiple health conditions.   Objective:   Blood pressure 118/69, pulse (!) 55, temperature 97.8 F (36.6 C), height 5\' 4"  (1.626 m), weight 189 lb (85.7 kg), SpO2 97 %. Body mass index is 32.44 kg/m.  General: Cooperative, alert, well developed, in no acute distress. HEENT: Conjunctivae and lids unremarkable. Cardiovascular: Regular rhythm.  Lungs: Normal work of breathing. Neurologic: No focal deficits.   Lab Results  Component Value Date   CREATININE 0.87 01/04/2022   BUN 14 01/04/2022   NA 142 01/04/2022   K 4.0 01/04/2022   CL 102 01/04/2022   CO2 27 01/04/2022   Lab Results  Component Value Date   ALT 18 01/04/2022   AST 16 01/04/2022   ALKPHOS 59 01/04/2022   BILITOT 1.2 01/04/2022   Lab Results  Component Value Date   HGBA1C 5.5 01/04/2022   HGBA1C 5.5 09/26/2021   HGBA1C 5.6 02/16/2021   HGBA1C 6.0 (H) 07/20/2020   Lab Results  Component Value Date   INSULIN 7.7 01/04/2022   INSULIN 2.5 (L) 09/26/2021   INSULIN 5.9 02/16/2021   INSULIN 17.8 07/20/2020   Lab Results  Component Value Date   TSH 1.680 11/02/2020   Lab Results  Component Value Date   CHOL  168 01/04/2022   HDL 58 01/04/2022   LDLCALC 100 (H) 01/04/2022   TRIG 46 01/04/2022   CHOLHDL 3.3 12/23/2018   Lab Results  Component Value Date   VD25OH 55.9 01/04/2022   VD25OH 50.4 09/26/2021   VD25OH 63.7 02/16/2021   Lab Results  Component Value Date   WBC 6.0 01/04/2022   HGB 13.2 01/04/2022   HCT 39.6 01/04/2022   MCV 93 01/04/2022   PLT 310 01/04/2022   Lab Results  Component Value Date   FERRITIN 1,561 (H) 04/13/2019   Attestation Statements:   Reviewed by clinician on day  of visit: allergies, medications, problem list, medical history, surgical history, family history, social history, and previous encounter notes.   I, Trixie Dredge, am acting as transcriptionist for Dennard Nip, MD.  I have reviewed the above documentation for accuracy and completeness, and I agree with the above. -  Dennard Nip, MD

## 2022-05-02 ENCOUNTER — Encounter (INDEPENDENT_AMBULATORY_CARE_PROVIDER_SITE_OTHER): Payer: Self-pay | Admitting: Family Medicine

## 2022-05-02 ENCOUNTER — Ambulatory Visit (INDEPENDENT_AMBULATORY_CARE_PROVIDER_SITE_OTHER): Payer: Commercial Managed Care - HMO | Admitting: Family Medicine

## 2022-05-02 VITALS — BP 132/76 | HR 67 | Temp 97.7°F | Ht 64.0 in | Wt 184.0 lb

## 2022-05-02 DIAGNOSIS — E559 Vitamin D deficiency, unspecified: Secondary | ICD-10-CM | POA: Diagnosis not present

## 2022-05-02 DIAGNOSIS — E669 Obesity, unspecified: Secondary | ICD-10-CM

## 2022-05-02 DIAGNOSIS — Z6834 Body mass index (BMI) 34.0-34.9, adult: Secondary | ICD-10-CM | POA: Insufficient documentation

## 2022-05-02 DIAGNOSIS — Z6832 Body mass index (BMI) 32.0-32.9, adult: Secondary | ICD-10-CM | POA: Insufficient documentation

## 2022-05-02 DIAGNOSIS — Z6833 Body mass index (BMI) 33.0-33.9, adult: Secondary | ICD-10-CM | POA: Insufficient documentation

## 2022-05-02 DIAGNOSIS — Z6831 Body mass index (BMI) 31.0-31.9, adult: Secondary | ICD-10-CM

## 2022-05-02 MED ORDER — VITAMIN D (ERGOCALCIFEROL) 1.25 MG (50000 UNIT) PO CAPS: 50000.0000 [IU] | ORAL_CAPSULE | ORAL | 0 refills | Status: DC

## 2022-05-08 ENCOUNTER — Ambulatory Visit (INDEPENDENT_AMBULATORY_CARE_PROVIDER_SITE_OTHER): Payer: Commercial Managed Care - HMO | Admitting: Family Medicine

## 2022-05-16 NOTE — Progress Notes (Unsigned)
Chief Complaint:   OBESITY [name] is here to discuss her progress with her obesity treatment plan along with follow-up of her obesity related diagnoses. [name] is on following a lower carbohydrate, vegetable and lean protein rich diet plan and states she is following her eating plan approximately 85% of the time. [name] states she is riding the exercise bike for 20-30 minutes 2-3 times per week.  Today's visit was #: 27 Starting weight: 228 lbs Starting date: 07/20/2020 Today's weight: 184 lbs Today's date: 05/02/2022 Total lbs lost to date: 44 Total lbs lost since last in-office visit: 5  Interim History: [name] has done well with her weight loss. She did a low carbohydrate plan, but she struggled to follow it strictly.   Subjective:   1. Vitamin D deficiency [name] is on Vitamin D, with no side effects noted. She is due for labs soon.   Assessment/Plan:   1. Vitamin D deficiency We will refill prescription Vitamin D for 1 month, and we will recheck labs in 1 month.   - Vitamin D, Ergocalciferol, (DRISDOL) 1.25 MG (50000 UNIT) CAPS capsule; Take 1 capsule (50,000 Units total) by mouth every 7 (seven) days.  Dispense: 4 capsule; Refill: 0  2. BMI 31.0-31.9,adult  3. Obesity, Beginning BMI 37.94 Shaylah is currently in the action stage of change. As such, her goal is to continue with weight loss efforts. She has agreed to change to the Category 2 Plan.   Exercise goals: As is.   Behavioral modification strategies: increasing lean protein intake and meal planning and cooking strategies.  [name] has agreed to follow-up with our clinic in 4 weeks. She was informed of the importance of frequent follow-up visits to maximize her success with intensive lifestyle modifications for her multiple health conditions.   Objective:   Blood pressure 132/76, pulse 67, temperature 97.7 F (36.5 C), height '5\' 4"'$  (1.626 m), weight 184 lb (83.5 kg), SpO2 96 %. Body mass index is 31.58  kg/m.  Lab Results  Component Value Date   CREATININE 0.87 01/04/2022   BUN 14 01/04/2022   NA 142 01/04/2022   K 4.0 01/04/2022   CL 102 01/04/2022   CO2 27 01/04/2022   Lab Results  Component Value Date   ALT 18 01/04/2022   AST 16 01/04/2022   ALKPHOS 59 01/04/2022   BILITOT 1.2 01/04/2022   Lab Results  Component Value Date   HGBA1C 5.5 01/04/2022   HGBA1C 5.5 09/26/2021   HGBA1C 5.6 02/16/2021   HGBA1C 6.0 (H) 07/20/2020   Lab Results  Component Value Date   INSULIN 7.7 01/04/2022   INSULIN 2.5 (L) 09/26/2021   INSULIN 5.9 02/16/2021   INSULIN 17.8 07/20/2020   Lab Results  Component Value Date   TSH 1.680 11/02/2020   Lab Results  Component Value Date   CHOL 168 01/04/2022   HDL 58 01/04/2022   LDLCALC 100 (H) 01/04/2022   TRIG 46 01/04/2022   CHOLHDL 3.3 12/23/2018   Lab Results  Component Value Date   VD25OH 55.9 01/04/2022   VD25OH 50.4 09/26/2021   VD25OH 63.7 02/16/2021   Lab Results  Component Value Date   WBC 6.0 01/04/2022   HGB 13.2 01/04/2022   HCT 39.6 01/04/2022   MCV 93 01/04/2022   PLT 310 01/04/2022   Lab Results  Component Value Date   FERRITIN 1,561 (H) 04/13/2019   Attestation Statements:   Reviewed by clinician on day of visit: allergies, medications, problem list, medical history,  surgical history, family history, social history, and previous encounter notes.  Time spent on visit including pre-visit chart review and post-visit care and charting was 30 minutes.   I, [name], am acting as transcriptionist for Dennard Nip, MD.  I have reviewed the above documentation for accuracy and completeness, and I agree with the above. -  ***

## 2022-05-21 ENCOUNTER — Other Ambulatory Visit: Payer: Self-pay | Admitting: Student

## 2022-05-21 DIAGNOSIS — I493 Ventricular premature depolarization: Secondary | ICD-10-CM

## 2022-05-30 ENCOUNTER — Ambulatory Visit (INDEPENDENT_AMBULATORY_CARE_PROVIDER_SITE_OTHER): Payer: Commercial Managed Care - HMO | Admitting: Family Medicine

## 2022-05-30 ENCOUNTER — Encounter (INDEPENDENT_AMBULATORY_CARE_PROVIDER_SITE_OTHER): Payer: Self-pay | Admitting: Family Medicine

## 2022-05-30 VITALS — BP 120/74 | HR 56 | Temp 98.0°F | Ht 64.0 in | Wt 187.0 lb

## 2022-05-30 DIAGNOSIS — E7849 Other hyperlipidemia: Secondary | ICD-10-CM | POA: Diagnosis not present

## 2022-05-30 DIAGNOSIS — R7303 Prediabetes: Secondary | ICD-10-CM | POA: Diagnosis not present

## 2022-05-30 DIAGNOSIS — E669 Obesity, unspecified: Secondary | ICD-10-CM

## 2022-05-30 DIAGNOSIS — E7800 Pure hypercholesterolemia, unspecified: Secondary | ICD-10-CM

## 2022-05-30 DIAGNOSIS — E876 Hypokalemia: Secondary | ICD-10-CM | POA: Diagnosis not present

## 2022-05-30 DIAGNOSIS — Z6832 Body mass index (BMI) 32.0-32.9, adult: Secondary | ICD-10-CM

## 2022-05-30 DIAGNOSIS — E559 Vitamin D deficiency, unspecified: Secondary | ICD-10-CM

## 2022-05-30 MED ORDER — VITAMIN D (ERGOCALCIFEROL) 1.25 MG (50000 UNIT) PO CAPS: 50000.0000 [IU] | ORAL_CAPSULE | ORAL | 0 refills | Status: DC

## 2022-05-31 LAB — CMP14+EGFR
ALT: 19 IU/L (ref 0–32)
AST: 16 IU/L (ref 0–40)
Albumin/Globulin Ratio: 1.9 (ref 1.2–2.2)
Albumin: 4.4 g/dL (ref 3.9–4.9)
Alkaline Phosphatase: 62 IU/L (ref 44–121)
BUN/Creatinine Ratio: 18 (ref 12–28)
BUN: 17 mg/dL (ref 8–27)
Bilirubin Total: 1.2 mg/dL (ref 0.0–1.2)
CO2: 24 mmol/L (ref 20–29)
Calcium: 9.7 mg/dL (ref 8.7–10.3)
Chloride: 103 mmol/L (ref 96–106)
Creatinine, Ser: 0.92 mg/dL (ref 0.57–1.00)
Globulin, Total: 2.3 g/dL (ref 1.5–4.5)
Glucose: 103 mg/dL — ABNORMAL HIGH (ref 70–99)
Potassium: 4 mmol/L (ref 3.5–5.2)
Sodium: 142 mmol/L (ref 134–144)
Total Protein: 6.7 g/dL (ref 6.0–8.5)
eGFR: 70 mL/min/{1.73_m2} (ref >59)

## 2022-05-31 LAB — TSH: TSH: 2.63 u[IU]/mL (ref 0.450–4.500)

## 2022-05-31 LAB — HEMOGLOBIN A1C
Est. average glucose Bld gHb Est-mCnc: 114 mg/dL
Hgb A1c MFr Bld: 5.6 % (ref 4.8–5.6)

## 2022-05-31 LAB — VITAMIN D 25 HYDROXY (VIT D DEFICIENCY, FRACTURES): Vit D, 25-Hydroxy: 58.9 ng/mL (ref 30.0–100.0)

## 2022-05-31 LAB — VITAMIN B12: Vitamin B-12: 652 pg/mL (ref 232–1245)

## 2022-05-31 LAB — LIPID PANEL WITH LDL/HDL RATIO
Cholesterol, Total: 170 mg/dL (ref 100–199)
HDL: 58 mg/dL (ref >39)
LDL Chol Calc (NIH): 101 mg/dL — ABNORMAL HIGH (ref 0–99)
LDL/HDL Ratio: 1.7 ratio (ref 0.0–3.2)
Triglycerides: 56 mg/dL (ref 0–149)
VLDL Cholesterol Cal: 11 mg/dL (ref 5–40)

## 2022-05-31 LAB — INSULIN, RANDOM: INSULIN: 6.2 u[IU]/mL (ref 2.6–24.9)

## 2022-06-06 ENCOUNTER — Other Ambulatory Visit: Payer: Self-pay | Admitting: Internal Medicine

## 2022-06-06 NOTE — Progress Notes (Unsigned)
Chief Complaint:   OBESITY [name] is here to discuss her progress with her obesity treatment plan along with follow-up of her obesity related diagnoses. [name] is on the Category 2 Plan and states she is following her eating plan approximately 50% of the time. [name] states she is doing 0 minutes 0 times per week.  Today's visit was #: 28 Starting weight: 228 lbs Starting date: 07/20/2020 Today's weight: 187 lbs Today's date: 05/30/2022 Total lbs lost to date: 41 Total lbs lost since last in-office visit: 0  Interim History: Dannae is struggling with weight loss after losing a significant amount of weight last year.  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  Sleep is also affected with continuing hot flashes at night.  Subjective:   1. Other hyperlipidemia [name] is working on decreasing cholesterol in her diet, and she is due for labs.  2. Vitamin D deficiency [name] is on vitamin D, and she is due for labs.  Her last vitamin D level was at goal.  3. Hypokalemia [name] is on chlorthalidone and KCl, and she is due for labs.  4. Prediabetes [name] is struggling with her diet and some weight gain.  This may be worsening her glucose control.  Assessment/Plan:   1. Other hyperlipidemia We will check labs today.  [name] will continue Lipitor, and will continue to work on decreasing cholesterol in her diet. - Lipid Panel With LDL/HDL Ratio - TSH  2. Vitamin D deficiency We will check labs today.  [name] will continue prescription vitamin D, and we will refill for 1 month.  - Vitamin D, Ergocalciferol, (DRISDOL) 1.25 MG (50000 UNIT) CAPS capsule; Take 1 capsule (50,000 Units total) by mouth every 7 (seven) days.  Dispense: 4 capsule; Refill: 0 - VITAMIN D 25 Hydroxy (Vit-D Deficiency, Fractures)  3. Hypokalemia We will check labs today, and we will follow-up at [place] next visit.  4. Prediabetes We will check  labs today.  [name] will get back to working on her diet and exercise.  - CMP14+EGFR - Insulin, random - Hemoglobin A1c - Vitamin B12  5. BMI 32.0-32.9,adult  6. Obesity, Beginning BMI 37.94 [name] is currently in the action stage of change. As such, her goal is to continue with weight loss efforts. She has agreed to keeping a food journal and adhering to recommended goals of 1200-1300 calories and 85+ grams of protein daily.   [name] is to change to journaling, and will discuss better sleep options including improving vasomotor symptoms of menopause.  Behavioral modification strategies: keeping a strict food journal.  [name] has agreed to follow-up with our clinic in 4 weeks. She was informed of the importance of frequent follow-up visits to maximize her success with intensive lifestyle modifications for her multiple health conditions.   [name] was informed we would discuss her lab results at her next visit unless there is a critical issue that needs to be addressed sooner. [name] agreed to keep her next visit at the agreed upon time to discuss these results.  Objective:   Blood pressure 120/74, pulse (!) 56, temperature 98 F (36.7 C), height 5\' 4"  (1.626 m), weight 187 lb (84.8 kg), SpO2 98 %. Body mass index is 32.1 kg/m.  Lab Results  Component Value Date   CREATININE 0.92 05/30/2022   BUN 17 05/30/2022   NA 142 05/30/2022   K 4.0 05/30/2022   CL 103 05/30/2022   CO2 24  05/30/2022   Lab Results  Component Value Date   ALT 19 05/30/2022   AST 16 05/30/2022   ALKPHOS 62 05/30/2022   BILITOT 1.2 05/30/2022   Lab Results  Component Value Date   HGBA1C 5.6 05/30/2022   HGBA1C 5.5 01/04/2022   HGBA1C 5.5 09/26/2021   HGBA1C 5.6 02/16/2021   HGBA1C 6.0 (H) 07/20/2020   Lab Results  Component Value Date   INSULIN 6.2 05/30/2022   INSULIN 7.7 01/04/2022   INSULIN 2.5 (L) 09/26/2021   INSULIN 5.9 02/16/2021   INSULIN 17.8 07/20/2020   Lab Results  Component  Value Date   TSH 2.630 05/30/2022   Lab Results  Component Value Date   CHOL 170 05/30/2022   HDL 58 05/30/2022   LDLCALC 101 (H) 05/30/2022   TRIG 56 05/30/2022   CHOLHDL 3.3 12/23/2018   Lab Results  Component Value Date   VD25OH 58.9 05/30/2022   VD25OH 55.9 01/04/2022   VD25OH 50.4 09/26/2021   Lab Results  Component Value Date   WBC 6.0 01/04/2022   HGB 13.2 01/04/2022   HCT 39.6 01/04/2022   MCV 93 01/04/2022   PLT 310 01/04/2022   Lab Results  Component Value Date   FERRITIN 1,561 (H) 04/13/2019   Attestation Statements:   Reviewed by clinician on day of visit: allergies, medications, problem list, medical history, surgical history, family history, social history, and previous encounter notes.   I, Trixie Dredge, am acting as transcriptionist for Dennard Nip, MD.  I have reviewed the above documentation for accuracy and completeness, and I agree with the above. -  Dennard Nip, MD

## 2022-06-28 ENCOUNTER — Encounter (INDEPENDENT_AMBULATORY_CARE_PROVIDER_SITE_OTHER): Payer: Self-pay | Admitting: Family Medicine

## 2022-06-28 ENCOUNTER — Ambulatory Visit (INDEPENDENT_AMBULATORY_CARE_PROVIDER_SITE_OTHER): Payer: Commercial Managed Care - HMO | Admitting: Family Medicine

## 2022-06-28 VITALS — BP 124/71 | HR 53 | Temp 97.9°F | Ht 64.0 in | Wt 190.0 lb

## 2022-06-28 DIAGNOSIS — Z6832 Body mass index (BMI) 32.0-32.9, adult: Secondary | ICD-10-CM

## 2022-06-28 DIAGNOSIS — E7849 Other hyperlipidemia: Secondary | ICD-10-CM

## 2022-06-28 DIAGNOSIS — E669 Obesity, unspecified: Secondary | ICD-10-CM

## 2022-07-02 NOTE — Progress Notes (Unsigned)
Chief Complaint:   OBESITY [name] is here to discuss her progress with her obesity treatment plan along with follow-up of her obesity related diagnoses. [name] is on keeping a food journal and adhering to recommended goals of 1200-1300 calories and 85+ grams of protein and states she is following her eating plan approximately 75-80% of the time. [name] states she is riding the exercise bike and doing cardio for 30 minutes 3-4 times per week.  Today's visit was #: 29 Starting weight: 228 lbs Starting date: 07/20/2020 Today's weight: 190 lbs Today's date: 06/28/2022 Total lbs lost to date: 38 Total lbs lost since last in-office visit: 0  Interim History: [name] is struggling to meet her protein goals. She did better with journaling and calories are sometimes higher than she thought.   Subjective:   1. Other hyperlipidemia [name] is on Lipitor, and her LDL was slightly above goal. She denies chest pain or myalgias. Her HDL and triglycerides were at goal. I discussed labs with the patient today.   Assessment/Plan:   1. Other hyperlipidemia [name] will continue Lipitor 40 mg, and will continue her diet and exercise. We will continue to follow.   2. BMI 32.0-32.9,adult  3. Obesity, Beginning BMI 37.94 [name] is currently in the action stage of change. As such, her goal is to continue with weight loss efforts. She has agreed to keeping a food journal and adhering to recommended goals of 1200-1300 calories and 85+ grams of protein daily.   Various protein rich foods with low cholesterol were discussed, and recipes were also discussed.   Exercise goals: As is.   Behavioral modification strategies: increasing lean protein intake and meal planning and cooking strategies.  [name] has agreed to follow-up with our clinic in 4 weeks. She was informed of the importance of frequent follow-up visits to maximize her success with intensive lifestyle modifications for her multiple health  conditions.   Objective:   Blood pressure 124/71, pulse (!) 53, temperature 97.9 F (36.6 C), height 5\' 4"  (1.626 m), weight 190 lb (86.2 kg), SpO2 98 %. Body mass index is 32.61 kg/m.  Lab Results  Component Value Date   CREATININE 0.92 05/30/2022   BUN 17 05/30/2022   NA 142 05/30/2022   K 4.0 05/30/2022   CL 103 05/30/2022   CO2 24 05/30/2022   Lab Results  Component Value Date   ALT 19 05/30/2022   AST 16 05/30/2022   ALKPHOS 62 05/30/2022   BILITOT 1.2 05/30/2022   Lab Results  Component Value Date   HGBA1C 5.6 05/30/2022   HGBA1C 5.5 01/04/2022   HGBA1C 5.5 09/26/2021   HGBA1C 5.6 02/16/2021   HGBA1C 6.0 (H) 07/20/2020   Lab Results  Component Value Date   INSULIN 6.2 05/30/2022   INSULIN 7.7 01/04/2022   INSULIN 2.5 (L) 09/26/2021   INSULIN 5.9 02/16/2021   INSULIN 17.8 07/20/2020   Lab Results  Component Value Date   TSH 2.630 05/30/2022   Lab Results  Component Value Date   CHOL 170 05/30/2022   HDL 58 05/30/2022   LDLCALC 101 (H) 05/30/2022   TRIG 56 05/30/2022   CHOLHDL 3.3 12/23/2018   Lab Results  Component Value Date   VD25OH 58.9 05/30/2022   VD25OH 55.9 01/04/2022   VD25OH 50.4 09/26/2021   Lab Results  Component Value Date   WBC 6.0 01/04/2022   HGB 13.2 01/04/2022   HCT 39.6 01/04/2022   MCV 93 01/04/2022   PLT 310 01/04/2022  Lab Results  Component Value Date   FERRITIN 1,561 (H) 04/13/2019   Attestation Statements:   Reviewed by clinician on day of visit: allergies, medications, problem list, medical history, surgical history, family history, social history, and previous encounter notes.  Time spent on visit including pre-visit chart review and post-visit care and charting was 30 minutes.   I, Burt Knack, am acting as transcriptionist for Quillian Quince, MD.  I have reviewed the above documentation for accuracy and completeness, and I agree with the above. -  Quillian Quince, MD

## 2022-07-11 ENCOUNTER — Ambulatory Visit: Payer: Commercial Managed Care - HMO | Admitting: Internal Medicine

## 2022-07-12 ENCOUNTER — Other Ambulatory Visit: Payer: Self-pay | Admitting: Cardiology

## 2022-07-12 DIAGNOSIS — E7849 Other hyperlipidemia: Secondary | ICD-10-CM

## 2022-07-31 ENCOUNTER — Encounter (INDEPENDENT_AMBULATORY_CARE_PROVIDER_SITE_OTHER): Payer: Self-pay | Admitting: Family Medicine

## 2022-07-31 ENCOUNTER — Ambulatory Visit (INDEPENDENT_AMBULATORY_CARE_PROVIDER_SITE_OTHER): Payer: Commercial Managed Care - HMO | Admitting: Family Medicine

## 2022-07-31 VITALS — BP 130/79 | HR 59 | Temp 98.0°F | Ht 64.0 in | Wt 189.0 lb

## 2022-07-31 DIAGNOSIS — F439 Reaction to severe stress, unspecified: Secondary | ICD-10-CM | POA: Diagnosis not present

## 2022-07-31 DIAGNOSIS — E669 Obesity, unspecified: Secondary | ICD-10-CM | POA: Diagnosis not present

## 2022-07-31 DIAGNOSIS — Z6832 Body mass index (BMI) 32.0-32.9, adult: Secondary | ICD-10-CM

## 2022-08-01 NOTE — Progress Notes (Signed)
Chief Complaint:   OBESITY [name] is here to discuss her progress with her obesity treatment plan along with follow-up of her obesity related diagnoses. [name] is on keeping a food journal and adhering to recommended goals of 1200-1300 calories and 85+ grams of protein and states she is following her eating plan approximately 70-75% of the time. [name] states she is exercising for 30 minutes 3 times per week.  Today's visit was #: 30 Starting weight: 228 lbs Starting date: 07/20/2020 Today's weight: 189 lbs Today's date: 07/31/2022 Total lbs lost to date: 39 Total lbs lost since last in-office visit: 1  Interim History: [name] continues to lose weight over the last month.  She is mindful of her food choices.  Her next weight goal is to lose to her lowest weight of 182 pounds.  She struggles with meal planning at times.  Subjective:   1. Stress [name] is dealing with some increased family stress.  She likes to take care of of her family but sometimes she does not have enough time for herself.  Assessment/Plan:   1. Stress [name] was offered support and encouragement.  She agreed to work on ways to do self-care and avoid caregiving burnout.  We will continue to follow.  2. BMI 32.0-32.9,adult  3. Obesity, Beginning BMI 37.94 [name] is currently in the action stage of change. As such, her goal is to continue with weight loss efforts. She has agreed to keeping a food journal and adhering to recommended goals of 1200-1300 calories and 85+ grams of protein daily.   Exercise goals: As is.   Behavioral modification strategies: increasing lean protein intake.  [name] has agreed to follow-up with our clinic in 4 weeks. She was informed of the importance of frequent follow-up visits to maximize her success with intensive lifestyle modifications for her multiple health conditions.   Objective:   Blood pressure 130/79, pulse (!) 59, temperature 98 F (36.7 C), height 5\' 4"  (1.626 m),  weight 189 lb (85.7 kg), SpO2 100 %. Body mass index is 32.44 kg/m.  Lab Results  Component Value Date   CREATININE 0.92 05/30/2022   BUN 17 05/30/2022   NA 142 05/30/2022   K 4.0 05/30/2022   CL 103 05/30/2022   CO2 24 05/30/2022   Lab Results  Component Value Date   ALT 19 05/30/2022   AST 16 05/30/2022   ALKPHOS 62 05/30/2022   BILITOT 1.2 05/30/2022   Lab Results  Component Value Date   HGBA1C 5.6 05/30/2022   HGBA1C 5.5 01/04/2022   HGBA1C 5.5 09/26/2021   HGBA1C 5.6 02/16/2021   HGBA1C 6.0 (H) 07/20/2020   Lab Results  Component Value Date   INSULIN 6.2 05/30/2022   INSULIN 7.7 01/04/2022   INSULIN 2.5 (L) 09/26/2021   INSULIN 5.9 02/16/2021   INSULIN 17.8 07/20/2020   Lab Results  Component Value Date   TSH 2.630 05/30/2022   Lab Results  Component Value Date   CHOL 170 05/30/2022   HDL 58 05/30/2022   LDLCALC 101 (H) 05/30/2022   TRIG 56 05/30/2022   CHOLHDL 3.3 12/23/2018   Lab Results  Component Value Date   VD25OH 58.9 05/30/2022   VD25OH 55.9 01/04/2022   VD25OH 50.4 09/26/2021   Lab Results  Component Value Date   WBC 6.0 01/04/2022   HGB 13.2 01/04/2022   HCT 39.6 01/04/2022   MCV 93 01/04/2022   PLT 310 01/04/2022   Lab Results  Component Value Date  FERRITIN 1,561 (H) 04/13/2019   Attestation Statements:   Reviewed by clinician on day of visit: allergies, medications, problem list, medical history, surgical history, family history, social history, and previous encounter notes.  Time spent on visit including pre-visit chart review and post-visit care and charting was 20 minutes.   I, Burt Knack, am acting as transcriptionist for Quillian Quince, MD.  I have reviewed the above documentation for accuracy and completeness, and I agree with the above. -  Quillian Quince, MD

## 2022-08-21 ENCOUNTER — Encounter (INDEPENDENT_AMBULATORY_CARE_PROVIDER_SITE_OTHER): Payer: Self-pay | Admitting: Family Medicine

## 2022-08-24 ENCOUNTER — Other Ambulatory Visit (INDEPENDENT_AMBULATORY_CARE_PROVIDER_SITE_OTHER): Payer: Self-pay | Admitting: Family Medicine

## 2022-08-24 DIAGNOSIS — E559 Vitamin D deficiency, unspecified: Secondary | ICD-10-CM

## 2022-08-28 ENCOUNTER — Ambulatory Visit (INDEPENDENT_AMBULATORY_CARE_PROVIDER_SITE_OTHER): Payer: Commercial Managed Care - HMO | Admitting: Family Medicine

## 2022-08-28 ENCOUNTER — Encounter (INDEPENDENT_AMBULATORY_CARE_PROVIDER_SITE_OTHER): Payer: Self-pay | Admitting: Family Medicine

## 2022-08-28 VITALS — BP 161/95 | HR 67 | Temp 98.1°F | Ht 64.0 in | Wt 190.0 lb

## 2022-08-28 DIAGNOSIS — E559 Vitamin D deficiency, unspecified: Secondary | ICD-10-CM

## 2022-08-28 DIAGNOSIS — Z6832 Body mass index (BMI) 32.0-32.9, adult: Secondary | ICD-10-CM

## 2022-08-28 DIAGNOSIS — I10 Essential (primary) hypertension: Secondary | ICD-10-CM

## 2022-08-28 DIAGNOSIS — E669 Obesity, unspecified: Secondary | ICD-10-CM

## 2022-08-28 MED ORDER — VITAMIN D (ERGOCALCIFEROL) 1.25 MG (50000 UNIT) PO CAPS: 50000.0000 [IU] | ORAL_CAPSULE | ORAL | 0 refills | Status: DC

## 2022-08-28 NOTE — Progress Notes (Unsigned)
Chief Complaint:   OBESITY [name] is here to discuss her progress with her obesity treatment plan along with follow-up of her obesity related diagnoses. [name] is on {MWMwtlossportion/plan2:23431} and states she is following her eating plan approximately ***% of the time. [name] states she is *** *** minutes *** times per week.  Today's visit was #: 31 Starting weight: 228 lbs Starting date: 07/20/2020 Today's weight: 190 lbs Today's date: 08/28/2022 Total lbs lost to date: 38 Total lbs lost since last in-office visit: 0  Interim History: ***  Subjective:   1. Vitamin D deficiency ***  2. Essential hypertension ***  Assessment/Plan:   1. Vitamin D deficiency *** - Vitamin D, Ergocalciferol, (DRISDOL) 1.25 MG (50000 UNIT) CAPS capsule; Take 1 capsule (50,000 Units total) by mouth every 7 (seven) days.  Dispense: 4 capsule; Refill: 0  2. Essential hypertension ***  3. BMI 32.0-32.9,adult  4. Obesity, Beginning BMI 37.94 [name] is currently in the action stage of change. As such, her goal is to continue with weight loss efforts. She has agreed to keeping a food journal and adhering to recommended goals of 1200-1300 calories and 85+ grams of protein daily.   Protein rich soft foods and recipes were discussed today.  Exercise goals: As is.   Behavioral modification strategies: increasing lean protein intake.  [name] has agreed to follow-up with our clinic in 4 weeks. She was informed of the importance of frequent follow-up visits to maximize her success with intensive lifestyle modifications for her multiple health conditions.   Objective:   Blood pressure (!) 161/95, pulse 67, temperature 98.1 F (36.7 C), height 5\' 4"  (1.626 m), weight 190 lb (86.2 kg), SpO2 98 %. Body mass index is 32.61 kg/m.  Lab Results  Component Value Date   CREATININE 0.92 05/30/2022   BUN 17 05/30/2022   NA 142 05/30/2022   K 4.0 05/30/2022   CL 103 05/30/2022   CO2 24 05/30/2022    Lab Results  Component Value Date   ALT 19 05/30/2022   AST 16 05/30/2022   ALKPHOS 62 05/30/2022   BILITOT 1.2 05/30/2022   Lab Results  Component Value Date   HGBA1C 5.6 05/30/2022   HGBA1C 5.5 01/04/2022   HGBA1C 5.5 09/26/2021   HGBA1C 5.6 02/16/2021   HGBA1C 6.0 (H) 07/20/2020   Lab Results  Component Value Date   INSULIN 6.2 05/30/2022   INSULIN 7.7 01/04/2022   INSULIN 2.5 (L) 09/26/2021   INSULIN 5.9 02/16/2021   INSULIN 17.8 07/20/2020   Lab Results  Component Value Date   TSH 2.630 05/30/2022   Lab Results  Component Value Date   CHOL 170 05/30/2022   HDL 58 05/30/2022   LDLCALC 101 (H) 05/30/2022   TRIG 56 05/30/2022   CHOLHDL 3.3 12/23/2018   Lab Results  Component Value Date   VD25OH 58.9 05/30/2022   VD25OH 55.9 01/04/2022   VD25OH 50.4 09/26/2021   Lab Results  Component Value Date   WBC 6.0 01/04/2022   HGB 13.2 01/04/2022   HCT 39.6 01/04/2022   MCV 93 01/04/2022   PLT 310 01/04/2022   Lab Results  Component Value Date   FERRITIN 1,561 (H) 04/13/2019   Attestation Statements:   Reviewed by clinician on day of visit: allergies, medications, problem list, medical history, surgical history, family history, social history, and previous encounter notes.   I, Burt Knack, am acting as transcriptionist for Quillian Quince, MD.  I have reviewed the above documentation for accuracy  and completeness, and I agree with the above. -  ***

## 2022-08-30 ENCOUNTER — Other Ambulatory Visit: Payer: Self-pay | Admitting: Cardiology

## 2022-09-22 ENCOUNTER — Other Ambulatory Visit (INDEPENDENT_AMBULATORY_CARE_PROVIDER_SITE_OTHER): Payer: Self-pay | Admitting: Family Medicine

## 2022-09-22 DIAGNOSIS — E559 Vitamin D deficiency, unspecified: Secondary | ICD-10-CM

## 2022-09-25 ENCOUNTER — Encounter (INDEPENDENT_AMBULATORY_CARE_PROVIDER_SITE_OTHER): Payer: Self-pay | Admitting: Family Medicine

## 2022-09-25 ENCOUNTER — Ambulatory Visit (INDEPENDENT_AMBULATORY_CARE_PROVIDER_SITE_OTHER): Payer: Commercial Managed Care - HMO | Admitting: Family Medicine

## 2022-09-25 VITALS — BP 121/71 | HR 60 | Temp 97.9°F | Ht 64.0 in | Wt 193.0 lb

## 2022-09-25 DIAGNOSIS — I10 Essential (primary) hypertension: Secondary | ICD-10-CM | POA: Diagnosis not present

## 2022-09-25 DIAGNOSIS — E559 Vitamin D deficiency, unspecified: Secondary | ICD-10-CM

## 2022-09-25 DIAGNOSIS — Z6833 Body mass index (BMI) 33.0-33.9, adult: Secondary | ICD-10-CM

## 2022-09-25 DIAGNOSIS — E669 Obesity, unspecified: Secondary | ICD-10-CM | POA: Diagnosis not present

## 2022-09-25 MED ORDER — VITAMIN D (ERGOCALCIFEROL) 1.25 MG (50000 UNIT) PO CAPS: 50000.0000 [IU] | ORAL_CAPSULE | ORAL | 0 refills | Status: DC

## 2022-09-25 NOTE — Progress Notes (Unsigned)
Chief Complaint:   OBESITY [name] is here to discuss her progress with her obesity treatment plan along with follow-up of her obesity related diagnoses. [name] is on keeping a food journal and adhering to recommended goals of 1200-1300 calories and 85+ grams of protein and states she is following her eating plan approximately 75% of the time. [name] states she is walking and stair climbing for 20-30 minutes 3 times per week.  Today's visit was #: 32 Starting weight: 228 lbs Starting date: 07/20/2020 Today's weight: 193 lbs Today's date: 09/25/2022 Total lbs lost to date: 35 Total lbs lost since last in-office visit: 0  Interim History: Patient is retaining some water weight today.  She has been struggling with journaling and she is open to other eating plans.  Subjective:   1. Essential hypertension Patient's blood pressure is controlled on her current medications, and she is doing well with her diet and exercise.  2. Vitamin D deficiency Patient is on vitamin D, and her last vitamin D level was at goal.  She denies nausea, vomiting, or muscle weakness.  Assessment/Plan:   1. Essential hypertension Patient will continue Toprol and chlorthalidone, and she will keep working on lifestyle modifications.  2. Vitamin D deficiency Patient will continue prescription vitamin D once weekly, and we will refill for 1 month.  We will recheck labs in 1 month.  - Vitamin D, Ergocalciferol, (DRISDOL) 1.25 MG (50000 UNIT) CAPS capsule; Take 1 capsule (50,000 Units total) by mouth every 7 (seven) days.  Dispense: 4 capsule; Refill: 0  3. BMI 33.0-33.9,adult  4. Obesity, Beginning BMI 37.94 [name] is currently in the action stage of change. As such, her goal is to continue with weight loss efforts. She has agreed to the Category 2 Plan or the Pescatarian Plan.   Exercise goals: As is.   Behavioral modification strategies: no skipping meals and meal planning and cooking strategies.  [name]  has agreed to follow-up with our clinic in 4 weeks. She was informed of the importance of frequent follow-up visits to maximize her success with intensive lifestyle modifications for her multiple health conditions.   Objective:   Blood pressure 121/71, pulse 60, temperature 97.9 F (36.6 C), height 5\' 4"  (1.626 m), weight 193 lb (87.5 kg), SpO2 96 %. Body mass index is 33.13 kg/m.  Lab Results  Component Value Date   CREATININE 0.92 05/30/2022   BUN 17 05/30/2022   NA 142 05/30/2022   K 4.0 05/30/2022   CL 103 05/30/2022   CO2 24 05/30/2022   Lab Results  Component Value Date   ALT 19 05/30/2022   AST 16 05/30/2022   ALKPHOS 62 05/30/2022   BILITOT 1.2 05/30/2022   Lab Results  Component Value Date   HGBA1C 5.6 05/30/2022   HGBA1C 5.5 01/04/2022   HGBA1C 5.5 09/26/2021   HGBA1C 5.6 02/16/2021   HGBA1C 6.0 (H) 07/20/2020   Lab Results  Component Value Date   INSULIN 6.2 05/30/2022   INSULIN 7.7 01/04/2022   INSULIN 2.5 (L) 09/26/2021   INSULIN 5.9 02/16/2021   INSULIN 17.8 07/20/2020   Lab Results  Component Value Date   TSH 2.630 05/30/2022   Lab Results  Component Value Date   CHOL 170 05/30/2022   HDL 58 05/30/2022   LDLCALC 101 (H) 05/30/2022   TRIG 56 05/30/2022   CHOLHDL 3.3 12/23/2018   Lab Results  Component Value Date   VD25OH 58.9 05/30/2022   VD25OH 55.9 01/04/2022   VD25OH  50.4 09/26/2021   Lab Results  Component Value Date   WBC 6.0 01/04/2022   HGB 13.2 01/04/2022   HCT 39.6 01/04/2022   MCV 93 01/04/2022   PLT 310 01/04/2022   Lab Results  Component Value Date   FERRITIN 1,561 (H) 04/13/2019   Attestation Statements:   Reviewed by clinician on day of visit: allergies, medications, problem list, medical history, surgical history, family history, social history, and previous encounter notes.   I, Burt Knack, am acting as transcriptionist for Quillian Quince, MD.  I have reviewed the above documentation for accuracy and  completeness, and I agree with the above. -  Quillian Quince, MD

## 2022-10-04 ENCOUNTER — Other Ambulatory Visit: Payer: Self-pay | Admitting: Cardiology

## 2022-10-04 DIAGNOSIS — E7849 Other hyperlipidemia: Secondary | ICD-10-CM

## 2022-10-24 ENCOUNTER — Encounter (INDEPENDENT_AMBULATORY_CARE_PROVIDER_SITE_OTHER): Payer: Self-pay | Admitting: Family Medicine

## 2022-10-24 ENCOUNTER — Ambulatory Visit (INDEPENDENT_AMBULATORY_CARE_PROVIDER_SITE_OTHER): Payer: Commercial Managed Care - HMO | Admitting: Family Medicine

## 2022-10-24 VITALS — BP 129/73 | HR 53 | Temp 98.0°F | Ht 64.0 in | Wt 190.0 lb

## 2022-10-24 DIAGNOSIS — E559 Vitamin D deficiency, unspecified: Secondary | ICD-10-CM

## 2022-10-24 DIAGNOSIS — E669 Obesity, unspecified: Secondary | ICD-10-CM | POA: Diagnosis not present

## 2022-10-24 DIAGNOSIS — R7303 Prediabetes: Secondary | ICD-10-CM | POA: Diagnosis not present

## 2022-10-24 DIAGNOSIS — Z6832 Body mass index (BMI) 32.0-32.9, adult: Secondary | ICD-10-CM

## 2022-10-24 DIAGNOSIS — E785 Hyperlipidemia, unspecified: Secondary | ICD-10-CM

## 2022-10-24 MED ORDER — VITAMIN D (ERGOCALCIFEROL) 1.25 MG (50000 UNIT) PO CAPS
50000.0000 [IU] | ORAL_CAPSULE | ORAL | 0 refills | Status: DC
Start: 2022-10-24 — End: 2022-11-22

## 2022-10-24 NOTE — Progress Notes (Unsigned)
Chief Complaint:   OBESITY [name] is here to discuss her progress with her obesity treatment plan along with follow-up of her obesity related diagnoses. [name] is on the Category 2 Plan or the Pescatarian Plan and states she is following her eating plan approximately 85% of the time. [name] states she is riding the exercise bike and stairs for 20-30 minutes 3 times per week.  Today's visit was #: 33 Starting weight: 228 lbs Starting date: 07/20/2020 Today's weight: 190 lbs Today's date: 10/24/2022 Total lbs lost to date: 38 Total lbs lost since last in-office visit: 3  Interim History: Patient has done well with weight loss.  She has not been able to concentrate on meal planning as much recently and she is working on making time for her own needs.  Subjective:   1. Prediabetes Patient is working on her diet, but she notes some increase in simple carbohydrates.  2. Vitamin D deficiency Patient is stable on vitamin D, and she is due for labs.  3. Dyslipidemia Patient is on Lipitor, and she is working on her diet.  She is due for labs.  Assessment/Plan:   1. Prediabetes We will check labs today.  Patient will continue with her diet, and we will follow-up at her next visit in 1 month.  - Insulin, random - Hemoglobin A1c - CMP14+EGFR  2. Vitamin D deficiency We will check labs today, and we will refill prescription vitamin D once weekly for 1 month.  - Vitamin D, Ergocalciferol, (DRISDOL) 1.25 MG (50000 UNIT) CAPS capsule; Take 1 capsule (50,000 Units total) by mouth every 7 (seven) days.  Dispense: 4 capsule; Refill: 0 - VITAMIN D 25 Hydroxy (Vit-D Deficiency, Fractures)  3. Dyslipidemia We will check labs today.  Patient will continue Lipitor, and we will follow-up at her next visit in 1 month.  - Lipid Panel With LDL/HDL Ratio  4. BMI 32.0-32.9,adult  5. Obesity, Beginning BMI 37.94 [name] is currently in the action stage of change. As such, her goal is to continue  with weight loss efforts. She has agreed to the Category 2 Plan.   Exercise goals: As is.  Behavioral modification strategies: meal planning and cooking strategies and emotional eating strategies.  [name] has agreed to follow-up with our clinic in 4 weeks. She was informed of the importance of frequent follow-up visits to maximize her success with intensive lifestyle modifications for her multiple health conditions.   [name] was informed we would discuss her lab results at her next visit unless there is a critical issue that needs to be addressed sooner. [name] agreed to keep her next visit at the agreed upon time to discuss these results.  Objective:   Blood pressure 129/73, pulse (!) 53, temperature 98 F (36.7 C), height 5\' 4"  (1.626 m), weight 190 lb (86.2 kg), SpO2 98%. Body mass index is 32.61 kg/m.  Lab Results  Component Value Date   CREATININE 0.92 05/30/2022   BUN 17 05/30/2022   NA 142 05/30/2022   K 4.0 05/30/2022   CL 103 05/30/2022   CO2 24 05/30/2022   Lab Results  Component Value Date   ALT 19 05/30/2022   AST 16 05/30/2022   ALKPHOS 62 05/30/2022   BILITOT 1.2 05/30/2022   Lab Results  Component Value Date   HGBA1C 5.6 05/30/2022   HGBA1C 5.5 01/04/2022   HGBA1C 5.5 09/26/2021   HGBA1C 5.6 02/16/2021   HGBA1C 6.0 (H) 07/20/2020   Lab Results  Component Value Date  INSULIN 6.2 05/30/2022   INSULIN 7.7 01/04/2022   INSULIN 2.5 (L) 09/26/2021   INSULIN 5.9 02/16/2021   INSULIN 17.8 07/20/2020   Lab Results  Component Value Date   TSH 2.630 05/30/2022   Lab Results  Component Value Date   CHOL 170 05/30/2022   HDL 58 05/30/2022   LDLCALC 101 (H) 05/30/2022   TRIG 56 05/30/2022   CHOLHDL 3.3 12/23/2018   Lab Results  Component Value Date   VD25OH 58.9 05/30/2022   VD25OH 55.9 01/04/2022   VD25OH 50.4 09/26/2021   Lab Results  Component Value Date   WBC 6.0 01/04/2022   HGB 13.2 01/04/2022   HCT 39.6 01/04/2022   MCV 93 01/04/2022    PLT 310 01/04/2022   Lab Results  Component Value Date   FERRITIN 1,561 (H) 04/13/2019   Attestation Statements:   Reviewed by clinician on day of visit: allergies, medications, problem list, medical history, surgical history, family history, social history, and previous encounter notes.   I, Burt Knack, am acting as transcriptionist for Quillian Quince, MD.  I have reviewed the above documentation for accuracy and completeness, and I agree with the above. -  Quillian Quince, MD

## 2022-10-24 NOTE — Progress Notes (Unsigned)
  Electrophysiology Office Note:   Date:  10/25/2022  ID:  Atasia Gleaves, DOB Apr 17, 1960, MRN [id number]  Primary Cardiologist: Armanda Magic, MD Electrophysiologist: Lewayne Bunting, MD      History of Present Illness:   [name] is a 62 y.o. female with h/o HTN, HLD, PVC's seen for routine electrophysiology followup.   Last seen in EP Clinic 02/2022 per Dr. Ladona Ridgel - at that time was felt to be controlled in regards to her PVC's on flecainide and toprol.   Since last being seen in our clinic the patient reports doing very well. She is working out with the [facility]. She asks about her HR being low at times - runs 40-60's.  She is not symptomatic with low HR and occurs at rest. Notes she has PVC's 2-3d per week with episodes that last 20-30 minutes at a time.    She denies chest pain, palpitations, dyspnea, PND, orthopnea, nausea, vomiting, dizziness, syncope, edema, weight gain, or early satiety.   Review of systems complete and found to be negative unless listed in HPI.   EP Information / Studies Reviewed:    EKG is ordered today. Personal review as below.  EKG Interpretation Date/Time:  Thursday October 25 2022 08:03:12 EDT Ventricular Rate:  62 PR Interval:  216 QRS Duration:  80 QT Interval:  406 QTC Calculation: 412 R Axis:   64  Text Interpretation: Sinus rhythm with 1st degree A-V block Nonspecific T wave abnormality When compared with ECG of 13-Apr-2019 10:29, Confirmed by Canary Brim ([id number]) on 10/25/2022 8:08:13 AM   Studies:  ETT 05/2012 > no EKG changes suggestive of ischemia  ECHO Stress Test 05/2017 > normal study after maximal exertion  Cardiac MRI 02/2018 > normal LV size and systolic function, LVEF 56%, normal RV size and diastolic function. No evidence for fibrofatty infiltration and no regional RV wall motion abnormalities. No myocardial LGE LTM 02/2022 > HR 45-mzx 95 bpm, ave 58 bmp. Predominant underlying rhythm SR, 1st  degree AVB. Ventricular trigeminy present 35.8s, ventricular ectopy 1.7%       Physical Exam:   VS:  BP 110/62   Pulse 62   Ht 5\' 4"  (1.626 m)   Wt 197 lb 3.2 oz (89.4 kg)   SpO2 99%   BMI 33.85 kg/m    Wt Readings from Last 3 Encounters:  10/25/22 197 lb 3.2 oz (89.4 kg)  10/24/22 190 lb (86.2 kg)  09/25/22 193 lb (87.5 kg)     GEN: Well nourished, well developed in no acute distress NECK: No JVD; No carotid bruits CARDIAC: Regular rate and rhythm, no murmurs, rubs, gallops RESPIRATORY:  Clear to auscultation without rales, wheezing or rhonchi  ABDOMEN: Soft, non-tender, non-distended EXTREMITIES:  No edema; No deformity   ASSESSMENT AND PLAN:    PVC's  -continue flecainide + toprol  -cr Cl 111 mL/min >  10/24/22 cr 0.73 -EKG reviewed with stable PR, QRS intervals -feels PVC burden is controlled, discussed if increased / changed to call clinic   HTN -BP well controlled    HLD  -lipitor per primary   Follow up with Dr. Ladona Ridgel in 6 months  Signed, Canary Brim, MSN, APRN, NP-C, AGACNP-BC [facility] - Electrophysiology  10/25/2022, 8:08 AM

## 2022-10-25 ENCOUNTER — Ambulatory Visit: Payer: Commercial Managed Care - HMO | Attending: Student | Admitting: Pulmonary Disease

## 2022-10-25 ENCOUNTER — Encounter: Payer: Self-pay | Admitting: Student

## 2022-10-25 VITALS — BP 110/62 | HR 62 | Ht 64.0 in | Wt 197.2 lb

## 2022-10-25 DIAGNOSIS — R002 Palpitations: Secondary | ICD-10-CM | POA: Diagnosis not present

## 2022-10-25 DIAGNOSIS — I10 Essential (primary) hypertension: Secondary | ICD-10-CM

## 2022-10-25 DIAGNOSIS — I493 Ventricular premature depolarization: Secondary | ICD-10-CM

## 2022-10-25 NOTE — Patient Instructions (Signed)
Medication Instructions:  Your physician recommends that you continue on your current medications as directed. Please refer to the Current Medication list given to you today.  *If you need a refill on your cardiac medications before your next appointment, please call your pharmacy*  Lab Work: None ordered If you have labs (blood work) drawn today and your tests are completely normal, you will receive your results only by: MyChart Message (if you have MyChart) OR A paper copy in the mail If you have any lab test that is abnormal or we need to change your treatment, we will call you to review the results.  Follow-Up: At [facility], you and your health needs are our priority.  As part of our continuing mission to provide you with exceptional heart care, we have created designated Provider Care Teams.  These Care Teams include your primary Cardiologist (physician) and Advanced Practice Providers (APPs -  Physician Assistants and Nurse Practitioners) who all work together to provide you with the care you need, when you need it.  Your next appointment:   6 month(s)  Provider:   Gregg Taylor, MD   

## 2022-11-08 ENCOUNTER — Telehealth: Payer: Self-pay | Admitting: Physician Assistant

## 2022-11-08 NOTE — Telephone Encounter (Signed)
Left message for Pt to call back.

## 2022-11-08 NOTE — Telephone Encounter (Addendum)
Reviewing charts for Monday - this patient is scheduled with me for 8am as a 1 year follow-up. It looks like the Monday follow-up was generated from seeing Dr. Mayford Knife, who follows her sleep apnea. (She sees EP for her EP issues and recently saw Brandi earlier this month.) I do not manage OSA so if the appt is to f/u OSA, would reschedule with Dr. Mayford Knife. If she is having any general cardiology issues she would like to discuss or is in need of follow-up for, let's keep that appointment, but still needs an appt with Dr. Mayford Knife going forward for OSA as well. Thank you!

## 2022-11-12 ENCOUNTER — Ambulatory Visit: Payer: Commercial Managed Care - HMO | Admitting: Physician Assistant

## 2022-11-15 ENCOUNTER — Other Ambulatory Visit (INDEPENDENT_AMBULATORY_CARE_PROVIDER_SITE_OTHER): Payer: Self-pay | Admitting: Family Medicine

## 2022-11-15 DIAGNOSIS — E559 Vitamin D deficiency, unspecified: Secondary | ICD-10-CM

## 2022-11-21 ENCOUNTER — Other Ambulatory Visit: Payer: Self-pay | Admitting: Family Medicine

## 2022-11-21 ENCOUNTER — Ambulatory Visit
Admission: RE | Admit: 2022-11-21 | Discharge: 2022-11-21 | Disposition: A | Discharge status: Home / Self Care | Payer: Commercial Managed Care - HMO | Source: Ambulatory Visit | Attending: Family Medicine | Admitting: Family Medicine

## 2022-11-21 DIAGNOSIS — M25551 Pain in right hip: Secondary | ICD-10-CM

## 2022-11-22 ENCOUNTER — Ambulatory Visit (INDEPENDENT_AMBULATORY_CARE_PROVIDER_SITE_OTHER): Payer: Commercial Managed Care - HMO | Admitting: Family Medicine

## 2022-11-22 ENCOUNTER — Encounter (INDEPENDENT_AMBULATORY_CARE_PROVIDER_SITE_OTHER): Payer: Self-pay | Admitting: Family Medicine

## 2022-11-22 VITALS — BP 120/73 | HR 57 | Temp 97.8°F | Ht 64.0 in | Wt 192.0 lb

## 2022-11-22 DIAGNOSIS — Z6833 Body mass index (BMI) 33.0-33.9, adult: Secondary | ICD-10-CM | POA: Diagnosis not present

## 2022-11-22 DIAGNOSIS — E669 Obesity, unspecified: Secondary | ICD-10-CM | POA: Diagnosis not present

## 2022-11-22 DIAGNOSIS — E559 Vitamin D deficiency, unspecified: Secondary | ICD-10-CM | POA: Diagnosis not present

## 2022-11-22 DIAGNOSIS — E7849 Other hyperlipidemia: Secondary | ICD-10-CM

## 2022-11-22 MED ORDER — VITAMIN D (ERGOCALCIFEROL) 1.25 MG (50000 UNIT) PO CAPS
50000.0000 [IU] | ORAL_CAPSULE | ORAL | 0 refills | Status: DC
Start: 2022-11-22 — End: 2023-01-17

## 2022-11-22 NOTE — Progress Notes (Signed)
Chief Complaint:   OBESITY [name] is here to discuss her progress with her obesity treatment plan along with follow-up of her obesity related diagnoses. [name] is on the Category 2 Plan and states she is following her eating plan approximately 50% of the time. [name] states she is doing 0 minutes 0 times per week.  Today's visit was #: 34 Starting weight: 228 lbs Starting date: 07/20/2020 Today's weight: 192 lbs Today's date: 11/22/2022 Total lbs lost to date: 36 Total lbs lost since last in-office visit: 0  Interim History: Patient is retaining some water today.  She notes eating out more and increased social eating.  Subjective:   1. Vitamin D deficiency Patient has been out of vitamin D and she requests a refill today.  Her recent labs were at goal.  I discussed labs with the patient today.  2. Other hyperlipidemia Patient is on Lipitor, and labs were reviewed together and have improved with her diet and weight loss.  No side effects were noted.  Assessment/Plan:   1. Vitamin D deficiency Patient will continue prescription vitamin D once weekly, and we will refill for 1 month.  - Vitamin D, Ergocalciferol, (DRISDOL) 1.25 MG (50000 UNIT) CAPS capsule; Take 1 capsule (50,000 Units total) by mouth every 7 (seven) days.  Dispense: 4 capsule; Refill: 0  2. Other hyperlipidemia Patient will continue Lipitor, and she will continue with her diet, exercise, and weight loss to help decrease the risk of heart disease.  3. BMI 33.0-33.9,adult  4. Obesity, Beginning BMI 37.94 [name] is currently in the action stage of change. As such, her goal is to continue with weight loss efforts. She has agreed to the Category 2 Plan and keeping a food journal and adhering to recommended goals of 400-600 calories and 35+ grams of protein at supper daily.   Patient was shown how to look up healthier options with [facility].  She will work on decreasing eating out as often.  Behavioral modification  strategies: increasing lean protein intake, decreasing eating out, and meal planning and cooking strategies.  [name] has agreed to follow-up with our clinic in 4 weeks. She was informed of the importance of frequent follow-up visits to maximize her success with intensive lifestyle modifications for her multiple health conditions.   Objective:   Blood pressure 120/73, pulse (!) 57, temperature 97.8 F (36.6 C), height 5\' 4"  (1.626 m), weight 192 lb (87.1 kg), SpO2 98%. Body mass index is 32.96 kg/m.  Lab Results  Component Value Date   CREATININE 0.73 10/24/2022   BUN 14 10/24/2022   NA 141 10/24/2022   K 3.6 10/24/2022   CL 99 10/24/2022   CO2 25 10/24/2022   Lab Results  Component Value Date   ALT 15 10/24/2022   AST 13 10/24/2022   ALKPHOS 60 10/24/2022   BILITOT 1.2 10/24/2022   Lab Results  Component Value Date   HGBA1C 5.6 10/24/2022   HGBA1C 5.6 05/30/2022   HGBA1C 5.5 01/04/2022   HGBA1C 5.5 09/26/2021   HGBA1C 5.6 02/16/2021   Lab Results  Component Value Date   INSULIN 5.4 10/24/2022   INSULIN 6.2 05/30/2022   INSULIN 7.7 01/04/2022   INSULIN 2.5 (L) 09/26/2021   INSULIN 5.9 02/16/2021   Lab Results  Component Value Date   TSH 2.630 05/30/2022   Lab Results  Component Value Date   CHOL 160 10/24/2022   HDL 58 10/24/2022   LDLCALC 91 10/24/2022   TRIG 56 10/24/2022  CHOLHDL 3.3 12/23/2018   Lab Results  Component Value Date   VD25OH 59.8 10/24/2022   VD25OH 58.9 05/30/2022   VD25OH 55.9 01/04/2022   Lab Results  Component Value Date   WBC 6.0 01/04/2022   HGB 13.2 01/04/2022   HCT 39.6 01/04/2022   MCV 93 01/04/2022   PLT 310 01/04/2022   Lab Results  Component Value Date   FERRITIN 1,561 (H) 04/13/2019   Attestation Statements:   Reviewed by clinician on day of visit: allergies, medications, problem list, medical history, surgical history, family history, social history, and previous encounter notes.   I, Burt Knack, am acting  as transcriptionist for Quillian Quince, MD.  I have reviewed the above documentation for accuracy and completeness, and I agree with the above. -  Quillian Quince, MD

## 2022-11-23 ENCOUNTER — Other Ambulatory Visit: Payer: Self-pay | Admitting: Cardiology

## 2022-11-26 ENCOUNTER — Other Ambulatory Visit: Payer: Self-pay | Admitting: Family Medicine

## 2022-11-26 DIAGNOSIS — Z1231 Encounter for screening mammogram for malignant neoplasm of breast: Secondary | ICD-10-CM

## 2022-11-28 ENCOUNTER — Encounter: Payer: Self-pay | Admitting: Cardiology

## 2022-11-28 ENCOUNTER — Telehealth: Payer: Self-pay | Admitting: *Deleted

## 2022-11-28 ENCOUNTER — Ambulatory Visit: Payer: Commercial Managed Care - HMO | Attending: Cardiology | Admitting: Cardiology

## 2022-11-28 VITALS — BP 140/60 | HR 48 | Ht 64.0 in | Wt 201.0 lb

## 2022-11-28 DIAGNOSIS — E7800 Pure hypercholesterolemia, unspecified: Secondary | ICD-10-CM

## 2022-11-28 DIAGNOSIS — I10 Essential (primary) hypertension: Secondary | ICD-10-CM | POA: Diagnosis not present

## 2022-11-28 DIAGNOSIS — G4733 Obstructive sleep apnea (adult) (pediatric): Secondary | ICD-10-CM

## 2022-11-28 DIAGNOSIS — R0683 Snoring: Secondary | ICD-10-CM

## 2022-11-28 NOTE — Patient Instructions (Signed)
Medication Instructions:  Your physician recommends that you continue on your current medications as directed. Please refer to the Current Medication list given to you today.  *If you need a refill on your cardiac medications before your next appointment, please call your pharmacy*   Lab Work: None.  If you have labs (blood work) drawn today and your tests are completely normal, you will receive your results only by: MyChart Message (if you have MyChart) OR A paper copy in the mail If you have any lab test that is abnormal or we need to change your treatment, we will call you to review the results.   Testing/Procedures: None.   Follow-Up:     Your next appointment:   8 week(s)  Provider:   Armanda Magic, MD     Other Instructions Dr. Mayford Knife has sent an order for an under-the-nose full face mask to your DME company. They should contact you for an in-person fitting.

## 2022-11-28 NOTE — Telephone Encounter (Signed)
-----   Message from Armanda Magic sent at 11/28/2022 11:23 AM EDT ----- order a new resMed under the nose FFM with a fitting through the DME

## 2022-11-28 NOTE — Telephone Encounter (Signed)
Order placed to [employer] via community message 

## 2022-11-28 NOTE — Progress Notes (Addendum)
Date:  11/28/2022   ID:  [name], DOB [birth year], MRN [id number] The patient was identified using 2 identifiers.  PCP:  Camie Patience, FNP   [facility] Providers Cardiologist:  Armanda Magic, MD Electrophysiologist:  Lewayne Bunting, MD     Evaluation Performed:  Follow-Up Visit  Chief Complaint:  OSA  History of Present Illness:    [name] is a 62 y.o. 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 This improved with reduction of HRT. She had a 2D echo in 2014 that showed normal LVEF at 65%, trivial AI and trivial MR with grade 1 DD. She is on medical therapy for her HTN and DLD with Toprol , HCTZ and simvastatin. Both chronic conditions are followed by her PCP. Dr. Okey Regal Web.  She was seen by Dr. Ladona Ridgel with EP in Sept for her PVCs and is on Flecainide and metoprolol for suppression.    She had a home sleep study April 2021 showing mild OSA with an AHI of 12.7/hr and O2 sats as low as 76%.   When I saw her a year ago she had not followed up with her PAP titration.  She underwent overnight pulse oximetry showing persistent nocturnal hypoxemia and subsequently underwent CPAP titration to 13 cm H2O.    She is here today for followup and is doing well.  She denies any chest pain or pressure, SOB, DOE, PND, orthopnea, LE edema, dizziness, palpitations or syncope. She is compliant with her meds and is tolerating meds with no SE.    She is doing well with her PAP device and thinks that she has gotten used to it. She is having a lot of problems with her mask.  She has a FFM that covers the bridge of her nose and it slides up into her eyes.  She has never tried the under the nose FFM.   She feels the pressure is adequate.  She still has times when she gets sleepy during the day and does not feel rested when she gets up.  She denies any significant nasal dryness or nasal congestion. She does have problems with mouth dryness  at times but has not tried to adjust the humidity.  She does not think that he snores.    Past Medical History:  Diagnosis Date   Arthritis    low back   Asthma    Back pain    Baker's cyst of knee, right    Constipation    GERD (gastroesophageal reflux disease)    Heart burn    Hyperlipidemia    Hypertension    IBS (irritable bowel syndrome)    Joint pain    Lactose intolerance    Muscle pain    Other fatigue    Palpitations    Pre-diabetes    PVC (premature ventricular contraction)    Ringing in ear    Sleep apnea    SOB (shortness of breath) on exertion    Urticaria    Vitamin D deficiency    Past Surgical History:  Procedure Laterality Date   APPENDECTOMY     carpal tunn     CESAREAN SECTION     KNEE ARTHROSCOPY WITH LATERAL MENISECTOMY Right 03/08/2021   Procedure: RIGHT KNEE PARTIAL LATERAL MENISCECTOMY;  Surgeon: Tarry Kos, MD;  Location: [facility];  Service: Orthopedics;  Laterality: Right;   NEUROMA SURGERY     SEPTOPLASTY  SINOSCOPY     TONSILLECTOMY     TOTAL ABDOMINAL HYSTERECTOMY W/ BILATERAL SALPINGOOPHORECTOMY     TUBAL LIGATION       Current Meds  Medication Sig   aspirin 81 MG chewable tablet Chew 1 tablet (81 mg total) by mouth daily.   atorvastatin (LIPITOR) 40 MG tablet Take 1 tablet by mouth once daily   carboxymethylcellulose (REFRESH PLUS) 0.5 % SOLN 1 drop daily as needed. 2 drops each eye   chlorthalidone (HYGROTON) 25 MG tablet Take 1 tablet by mouth once daily   cyclobenzaprine (FLEXERIL) 10 MG tablet Take 10 mg by mouth 2 (two) times daily as needed.   estradiol (VIVELLE-DOT) 0.05 MG/24HR patch Place 1 patch onto the skin 2 (two) times a week.    famotidine (PEPCID) 20 MG tablet Take 1 tablet (20 mg total) by mouth daily.   flecainide (TAMBOCOR) 150 MG tablet Take 1/2 (one-half) tablet by mouth twice daily   levalbuterol (XOPENEX HFA) 45 MCG/ACT inhaler Inhale 2 puffs into the lungs every 6 (six) hours as needed  for wheezing.   metoprolol succinate (TOPROL-XL) 50 MG 24 hr tablet TAKE 1 TABLET BY MOUTH ONCE DAILY WITH MEALS OR  IMMEDIATELY  AFTER   montelukast (SINGULAIR) 10 MG tablet Take 10 mg by mouth daily as needed.   potassium chloride SA (KLOR-CON M) 20 MEQ tablet Take 1 tablet (20 mEq total) by mouth daily.   Probiotic Product (PROBIOTIC-10 PO) Take by mouth as needed.   Vitamin D, Ergocalciferol, (DRISDOL) 1.25 MG (50000 UNIT) CAPS capsule Take 1 capsule (50,000 Units total) by mouth every 7 (seven) days.     Allergies:   Buprenorphine hcl, Morphine and codeine, Morphine sulfate, Other, and Short ragweed pollen ext   Social History   Tobacco Use   Smoking status: Never   Smokeless tobacco: Never  Vaping Use   Vaping status: Never Used  Substance Use Topics   Alcohol use: Not Currently   Drug use: Never     Family Hx: The patient's family history includes Alcoholism in her father; Allergic rhinitis in her maternal grandmother; Asthma in her father; Cancer in her father; Hyperlipidemia in her brother and sister; Hypertension in her father; Stroke in her father; Sudden death in her mother. There is no history of Eczema or Urticaria.  ROS:   Please see the history of present illness.     All other systems reviewed and are negative.   Prior CV studies:   The following studies were reviewed today:  CPAP titration and PAP compliance download  Labs/Other Tests and Data Reviewed:     Recent Labs: 01/04/2022: Hemoglobin 13.2; Platelets 310 05/30/2022: TSH 2.630 10/24/2022: ALT 15; BUN 14; Creatinine, Ser 0.73; Potassium 3.6; Sodium 141   Recent Lipid Panel Lab Results  Component Value Date/Time   CHOL 160 10/24/2022 10:26 AM   TRIG 56 10/24/2022 10:26 AM   HDL 58 10/24/2022 10:26 AM   CHOLHDL 3.3 12/23/2018 01:22 PM   LDLCALC 91 10/24/2022 10:26 AM    Wt Readings from Last 3 Encounters:  11/28/22 201 lb (91.2 kg)  11/22/22 192 lb (87.1 kg)  10/25/22 197 lb 3.2 oz (89.4 kg)      Risk Assessment/Calculations:          Objective:    Vital Signs:  BP (!) 140/60   Pulse (!) 48   Ht 5\' 4"  (1.626 m)   Wt 201 lb (91.2 kg)   SpO2 97%   BMI 34.50 kg/m  GEN: Well nourished, well developed in no acute distress HEENT: Normal NECK: No JVD; No carotid bruits LYMPHATICS: No lymphadenopathy CARDIAC:RRR, no murmurs, rubs, gallops RESPIRATORY:  Clear to auscultation without rales, wheezing or rhonchi  ABDOMEN: Soft, non-tender, non-distended MUSCULOSKELETAL:  No edema; No deformity  SKIN: Warm and dry NEUROLOGIC:  Alert and oriented x 3 PSYCHIATRIC:  Normal affect  ASSESSMENT & PLAN:    OSA - The patient is tolerating PAP therapy but is having problems with the mask. The PAP download performed by his DME was personally reviewed and interpreted by me today and showed an AHI of 0.6 /hr on 13 cm H2O with 7% compliance in using more than 4 hours nightly.  The patient has been using and benefiting from PAP use and will continue to benefit from therapy.  -I have encouraged her to be more compliant with her device.  Hopefully by getting a new type of mask she will tolerate it better -will order a new resMed under the nose FFM with a fitting through the DME   HTN -BP is controlled on exam today -Continue prescription drug management with chlorthalidone 25 mg daily, Toprol-XL 50 mg daily with as needed refills -I have personally reviewed and interpreted outside labs performed by patient's PCP which showed serum creatinine 0.73 and potassium 3.6 on 10/24/2022  HLD -LDL goal < 100 -I have personally reviewed and interpreted outside labs performed by patient's PCP which showed LDL 91 and HDL 58 on 10/24/2022 -Continue drug managed with atorvastatin 40 mg daily with as needed refills     Medication Adjustments/Labs and Tests Ordered: Current medicines are reviewed at length with the patient today.  Concerns regarding medicines are outlined above.   Tests Ordered: No orders  of the defined types were placed in this encounter.   Medication Changes: No orders of the defined types were placed in this encounter.   Follow Up:  In Person in 8 weeks  Signed, Armanda Magic, MD  11/28/2022 11:15 AM    [facility]

## 2022-12-04 ENCOUNTER — Telehealth: Payer: Self-pay | Admitting: Cardiology

## 2022-12-04 ENCOUNTER — Other Ambulatory Visit: Payer: Self-pay | Admitting: Orthopedic Surgery

## 2022-12-04 NOTE — Telephone Encounter (Signed)
Pre-operative Risk Assessment    Patient Name: [name]  DOB: [birth year] MRN: [id number]      Request for Surgical Clearance    Procedure:   Excision Left Ring Finger Annular Ligament Cyst  Date of Surgery:  Clearance 01/15/23                                 Surgeon:  Dr. Laban Emperor Group or Practice Name:  [facility] Phone number:  [phone]  Fax number:  [phone]   Type of Clearance Requested:   - Pharmacy:  Hold Aspirin     Type of Anesthesia:   Choice   Additional requests/questions:  Please advise surgeon/provider what medications should be held.  Signed, Belisicia T Woods   12/04/2022, 4:03 PM

## 2022-12-05 NOTE — Telephone Encounter (Signed)
Patient Name: [name]  DOB: 1960/04/30 MRN: [id number]  Primary Cardiologist: Armanda Magic, MD  Procedure:   Excision Left Ring Finger Annular Ligament Cyst Date of Surgery:  Clearance 01/15/23                              Surgeon:  Dr. Laban Emperor Group or Practice Name:  [facility] Phone number:  [phone]  Chart reviewed as part of pre-operative protocol coverage. Pre-op clearance already addressed by colleagues in earlier phone notes. To summarize recommendations:  - Per Dr. Mayford Knife "She is fine from cardiac standpoint for procedure" - Per Canary Brim, NP "from an EP standpoint she can proceed."  Ideally aspirin would be continued through the perioperative period, if felt to be too high risk for bleeding can be held for 5-7 days prior to procedure. Please resume when felt to be safe to do so from a bleeding perspective.   Will route this bundled recommendation to requesting provider via Epic fax function and remove from pre-op pool. Please call with questions.  Rip Harbour, NP 12/05/2022, 4:50 PM

## 2022-12-20 ENCOUNTER — Ambulatory Visit (INDEPENDENT_AMBULATORY_CARE_PROVIDER_SITE_OTHER): Payer: Managed Care, Other (non HMO) | Admitting: Family Medicine

## 2022-12-20 ENCOUNTER — Encounter (INDEPENDENT_AMBULATORY_CARE_PROVIDER_SITE_OTHER): Payer: Self-pay | Admitting: Family Medicine

## 2022-12-20 VITALS — BP 127/73 | HR 55 | Temp 97.7°F | Ht 64.0 in | Wt 194.0 lb

## 2022-12-20 DIAGNOSIS — Z6833 Body mass index (BMI) 33.0-33.9, adult: Secondary | ICD-10-CM | POA: Diagnosis not present

## 2022-12-20 DIAGNOSIS — E669 Obesity, unspecified: Secondary | ICD-10-CM

## 2022-12-20 DIAGNOSIS — E7849 Other hyperlipidemia: Secondary | ICD-10-CM | POA: Diagnosis not present

## 2022-12-20 NOTE — Progress Notes (Signed)
Chief Complaint:   OBESITY [name] is here to discuss her progress with her obesity treatment plan along with follow-up of her obesity related diagnoses. [name] is on the Category 2 Plan and keeping a food journal and adhering to recommended goals of 400-600 calories and 35+ grams of protein at supper and states she is following her eating plan approximately 50% of the time. [name] states she is riding the bike for 15-20 minutes 2 times per week.  Today's visit was #: 35 Starting weight: 228 lbs Starting date: 07/20/2020 Today's weight: 194 lbs Today's date: 12/20/2022 Total lbs lost to date: 34 Total lbs lost since last in-office visit: 0  Interim History: Patient has not been able to follow her plan as closely recently.  She has been busy with some other health issues.  Subjective:   1. Other hyperlipidemia Patient continues to work on her diet in addition to Lipitor.  She denies chest pain or myalgias.  Assessment/Plan:   1. Other hyperlipidemia Patient will continue with her diet and Lipitor, and we will continue to follow closely.  2. BMI 33.0-33.9,adult  3. Obesity, Beginning BMI 37.94 [name] is currently in the action stage of change. As such, her goal is to continue with weight loss efforts. She has agreed to the Category 2 Plan and keeping a food journal and adhering to recommended goals of 400-600 calories and 35+ grams of protein at supper daily.   Exercise goals: As is.   Behavioral modification strategies: increasing lean protein intake.  [name] has agreed to follow-up with our clinic in 4 weeks. She was informed of the importance of frequent follow-up visits to maximize her success with intensive lifestyle modifications for her multiple health conditions.   Objective:   Blood pressure 127/73, pulse (!) 55, temperature 97.7 F (36.5 C), height 5\' 4"  (1.626 m), weight 194 lb (88 kg), SpO2 98%. Body mass index is 33.3 kg/m.  Lab Results  Component Value Date    CREATININE 0.73 10/24/2022   BUN 14 10/24/2022   NA 141 10/24/2022   K 3.6 10/24/2022   CL 99 10/24/2022   CO2 25 10/24/2022   Lab Results  Component Value Date   ALT 15 10/24/2022   AST 13 10/24/2022   ALKPHOS 60 10/24/2022   BILITOT 1.2 10/24/2022   Lab Results  Component Value Date   HGBA1C 5.6 10/24/2022   HGBA1C 5.6 05/30/2022   HGBA1C 5.5 01/04/2022   HGBA1C 5.5 09/26/2021   HGBA1C 5.6 02/16/2021   Lab Results  Component Value Date   INSULIN 5.4 10/24/2022   INSULIN 6.2 05/30/2022   INSULIN 7.7 01/04/2022   INSULIN 2.5 (L) 09/26/2021   INSULIN 5.9 02/16/2021   Lab Results  Component Value Date   TSH 2.630 05/30/2022   Lab Results  Component Value Date   CHOL 160 10/24/2022   HDL 58 10/24/2022   LDLCALC 91 10/24/2022   TRIG 56 10/24/2022   CHOLHDL 3.3 12/23/2018   Lab Results  Component Value Date   VD25OH 59.8 10/24/2022   VD25OH 58.9 05/30/2022   VD25OH 55.9 01/04/2022   Lab Results  Component Value Date   WBC 6.0 01/04/2022   HGB 13.2 01/04/2022   HCT 39.6 01/04/2022   MCV 93 01/04/2022   PLT 310 01/04/2022   Lab Results  Component Value Date   FERRITIN 1,561 (H) 04/13/2019   Attestation Statements:   Reviewed by clinician on day of visit: allergies, medications, problem list, medical history, surgical  history, family history, social history, and previous encounter notes.  Time spent on visit including pre-visit chart review and post-visit care and charting was 20 minutes.   I, Burt Knack, am acting as transcriptionist for Quillian Quince, MD.  I have reviewed the above documentation for accuracy and completeness, and I agree with the above. -  Quillian Quince, MD

## 2022-12-21 ENCOUNTER — Other Ambulatory Visit (INDEPENDENT_AMBULATORY_CARE_PROVIDER_SITE_OTHER): Payer: Self-pay | Admitting: Family Medicine

## 2022-12-21 DIAGNOSIS — E559 Vitamin D deficiency, unspecified: Secondary | ICD-10-CM

## 2022-12-24 ENCOUNTER — Encounter (INDEPENDENT_AMBULATORY_CARE_PROVIDER_SITE_OTHER): Payer: Self-pay | Admitting: Family Medicine

## 2022-12-27 ENCOUNTER — Ambulatory Visit
Admission: RE | Admit: 2022-12-27 | Discharge: 2022-12-27 | Disposition: A | Discharge status: Home / Self Care | Payer: Managed Care, Other (non HMO) | Source: Ambulatory Visit | Attending: Family Medicine | Admitting: Family Medicine

## 2022-12-27 DIAGNOSIS — Z1231 Encounter for screening mammogram for malignant neoplasm of breast: Secondary | ICD-10-CM

## 2023-01-07 ENCOUNTER — Other Ambulatory Visit: Payer: Self-pay

## 2023-01-07 ENCOUNTER — Encounter (HOSPITAL_BASED_OUTPATIENT_CLINIC_OR_DEPARTMENT_OTHER): Payer: Self-pay | Admitting: Orthopedic Surgery

## 2023-01-10 ENCOUNTER — Encounter (HOSPITAL_BASED_OUTPATIENT_CLINIC_OR_DEPARTMENT_OTHER)
Admission: RE | Admit: 2023-01-10 | Discharge: 2023-01-10 | Disposition: A | Discharge status: Home / Self Care | Payer: Commercial Managed Care - HMO | Source: Ambulatory Visit | Attending: Orthopedic Surgery | Admitting: Orthopedic Surgery

## 2023-01-10 DIAGNOSIS — Z01812 Encounter for preprocedural laboratory examination: Secondary | ICD-10-CM | POA: Insufficient documentation

## 2023-01-10 LAB — BASIC METABOLIC PANEL
Anion gap: 9 (ref 5–15)
BUN: 16 mg/dL (ref 8–23)
CO2: 29 mmol/L (ref 22–32)
Calcium: 9.4 mg/dL (ref 8.9–10.3)
Chloride: 103 mmol/L (ref 98–111)
Creatinine, Ser: 0.93 mg/dL (ref 0.44–1.00)
GFR, Estimated: >60 mL/min (ref >60)
Glucose, Bld: 113 mg/dL — ABNORMAL HIGH (ref 70–99)
Potassium: 3.7 mmol/L (ref 3.5–5.1)
Sodium: 141 mmol/L (ref 135–145)

## 2023-01-10 NOTE — Progress Notes (Signed)

## 2023-01-15 ENCOUNTER — Encounter (HOSPITAL_BASED_OUTPATIENT_CLINIC_OR_DEPARTMENT_OTHER): Payer: Self-pay | Admitting: Orthopedic Surgery

## 2023-01-15 ENCOUNTER — Ambulatory Visit (HOSPITAL_BASED_OUTPATIENT_CLINIC_OR_DEPARTMENT_OTHER)
Admission: RE | Admit: 2023-01-15 | Discharge: 2023-01-15 | Disposition: A | Discharge status: Home / Self Care | Payer: Commercial Managed Care - HMO | Attending: Orthopedic Surgery | Admitting: Orthopedic Surgery

## 2023-01-15 ENCOUNTER — Other Ambulatory Visit: Payer: Self-pay

## 2023-01-15 ENCOUNTER — Encounter (HOSPITAL_BASED_OUTPATIENT_CLINIC_OR_DEPARTMENT_OTHER)
Admission: RE | Disposition: A | Discharge status: Home / Self Care | Payer: Self-pay | Source: Home / Self Care | Attending: Orthopedic Surgery

## 2023-01-15 ENCOUNTER — Ambulatory Visit (HOSPITAL_BASED_OUTPATIENT_CLINIC_OR_DEPARTMENT_OTHER): Payer: Commercial Managed Care - HMO | Admitting: Anesthesiology

## 2023-01-15 DIAGNOSIS — J45909 Unspecified asthma, uncomplicated: Secondary | ICD-10-CM | POA: Insufficient documentation

## 2023-01-15 DIAGNOSIS — I10 Essential (primary) hypertension: Secondary | ICD-10-CM | POA: Insufficient documentation

## 2023-01-15 DIAGNOSIS — M24242 Disorder of ligament, left hand: Secondary | ICD-10-CM | POA: Diagnosis not present

## 2023-01-15 DIAGNOSIS — M67442 Ganglion, left hand: Secondary | ICD-10-CM | POA: Insufficient documentation

## 2023-01-15 DIAGNOSIS — K219 Gastro-esophageal reflux disease without esophagitis: Secondary | ICD-10-CM | POA: Insufficient documentation

## 2023-01-15 DIAGNOSIS — G4730 Sleep apnea, unspecified: Secondary | ICD-10-CM | POA: Diagnosis not present

## 2023-01-15 HISTORY — PX: CYST EXCISION: SHX5701

## 2023-01-15 SURGERY — CYST REMOVAL
Anesthesia: General | Site: Hand | Laterality: Left

## 2023-01-15 MED ORDER — CELECOXIB 200 MG PO CAPS
200.0000 mg | ORAL_CAPSULE | Freq: Once | ORAL | Status: AC
  Administered 2023-01-15: 200 mg via ORAL

## 2023-01-15 MED ORDER — LACTATED RINGERS IV SOLN: INTRAVENOUS | Status: DC | PRN

## 2023-01-15 MED ORDER — BUPIVACAINE-EPINEPHRINE (PF) 0.5% -1:200000 IJ SOLN
INTRAMUSCULAR | Status: AC
  Filled 2023-01-15: qty 30

## 2023-01-15 MED ORDER — PROPOFOL 10 MG/ML IV BOLUS
INTRAVENOUS | Status: DC | PRN
  Administered 2023-01-15: 150 ug via INTRAVENOUS
  Administered 2023-01-15: 50 ug via INTRAVENOUS

## 2023-01-15 MED ORDER — CELECOXIB 200 MG PO CAPS
ORAL_CAPSULE | ORAL | Status: AC
  Filled 2023-01-15: qty 1

## 2023-01-15 MED ORDER — CEFAZOLIN SODIUM-DEXTROSE 2-4 GM/100ML-% IV SOLN: 2.0000 g | INTRAVENOUS | Status: DC

## 2023-01-15 MED ORDER — ACETAMINOPHEN 160 MG/5ML PO SOLN: 325.0000 mg | ORAL | Status: DC | PRN

## 2023-01-15 MED ORDER — ACETAMINOPHEN 325 MG PO TABS: 325.0000 mg | ORAL_TABLET | ORAL | Status: DC | PRN

## 2023-01-15 MED ORDER — FENTANYL CITRATE (PF) 100 MCG/2ML IJ SOLN: 25.0000 ug | INTRAMUSCULAR | Status: DC | PRN

## 2023-01-15 MED ORDER — CEFAZOLIN SODIUM-DEXTROSE 2-3 GM-%(50ML) IV SOLR
INTRAVENOUS | Status: DC | PRN
  Administered 2023-01-15: 2 g via INTRAVENOUS

## 2023-01-15 MED ORDER — MIDAZOLAM HCL 2 MG/2ML IJ SOLN
INTRAMUSCULAR | Status: DC | PRN
  Administered 2023-01-15: 2 mg via INTRAVENOUS

## 2023-01-15 MED ORDER — FENTANYL CITRATE (PF) 100 MCG/2ML IJ SOLN
INTRAMUSCULAR | Status: AC
  Filled 2023-01-15: qty 2

## 2023-01-15 MED ORDER — BUPIVACAINE HCL (PF) 0.25 % IJ SOLN
INTRAMUSCULAR | Status: DC | PRN
  Administered 2023-01-15: 9 mL

## 2023-01-15 MED ORDER — ONDANSETRON HCL 4 MG/2ML IJ SOLN: 4.0000 mg | Freq: Once | INTRAMUSCULAR | Status: DC | PRN

## 2023-01-15 MED ORDER — ONDANSETRON HCL 4 MG/2ML IJ SOLN
INTRAMUSCULAR | Status: DC | PRN
  Administered 2023-01-15: 4 mg via INTRAVENOUS

## 2023-01-15 MED ORDER — OXYCODONE HCL 5 MG/5ML PO SOLN: 5.0000 mg | Freq: Once | ORAL | Status: DC | PRN

## 2023-01-15 MED ORDER — CEFAZOLIN SODIUM-DEXTROSE 2-4 GM/100ML-% IV SOLN
INTRAVENOUS | Status: AC
  Filled 2023-01-15: qty 100

## 2023-01-15 MED ORDER — LACTATED RINGERS IV SOLN: INTRAVENOUS | Status: DC

## 2023-01-15 MED ORDER — BUPIVACAINE HCL (PF) 0.25 % IJ SOLN
INTRAMUSCULAR | Status: AC
  Filled 2023-01-15: qty 30

## 2023-01-15 MED ORDER — MEPERIDINE HCL 25 MG/ML IJ SOLN: 6.2500 mg | INTRAMUSCULAR | Status: DC | PRN

## 2023-01-15 MED ORDER — DEXAMETHASONE SODIUM PHOSPHATE 10 MG/ML IJ SOLN
INTRAMUSCULAR | Status: DC | PRN
  Administered 2023-01-15: 5 mg via INTRAVENOUS

## 2023-01-15 MED ORDER — MIDAZOLAM HCL 2 MG/2ML IJ SOLN
INTRAMUSCULAR | Status: AC
  Filled 2023-01-15: qty 2

## 2023-01-15 MED ORDER — OXYCODONE HCL 5 MG PO TABS: 5.0000 mg | ORAL_TABLET | Freq: Once | ORAL | Status: DC | PRN

## 2023-01-15 MED ORDER — TRAMADOL HCL 50 MG PO TABS: ORAL_TABLET | ORAL | 0 refills | Status: DC

## 2023-01-15 MED ORDER — PROPOFOL 10 MG/ML IV BOLUS
INTRAVENOUS | Status: AC
  Filled 2023-01-15: qty 20

## 2023-01-15 MED ORDER — ACETAMINOPHEN 500 MG PO TABS
ORAL_TABLET | ORAL | Status: AC
  Filled 2023-01-15: qty 2

## 2023-01-15 MED ORDER — LIDOCAINE 2% (20 MG/ML) 5 ML SYRINGE
INTRAMUSCULAR | Status: DC | PRN
  Administered 2023-01-15: 100 mg via INTRAVENOUS

## 2023-01-15 MED ORDER — ACETAMINOPHEN 500 MG PO TABS
1000.0000 mg | ORAL_TABLET | Freq: Once | ORAL | Status: AC
  Administered 2023-01-15: 1000 mg via ORAL

## 2023-01-15 SURGICAL SUPPLY — 54 items
APL PRP STRL LF DISP 70% ISPRP (MISCELLANEOUS) ×1
APL SKNCLS STERI-STRIP NONHPOA (GAUZE/BANDAGES/DRESSINGS)
BANDAGE GAUZE 1X75IN STRL (MISCELLANEOUS) IMPLANT
BENZOIN TINCTURE PRP APPL 2/3 (GAUZE/BANDAGES/DRESSINGS) IMPLANT
BLADE MINI RND TIP GREEN BEAV (BLADE) IMPLANT
BLADE SURG 15 STRL LF DISP TIS (BLADE) ×2 IMPLANT
BLADE SURG 15 STRL SS (BLADE) ×2
BNDG CMPR 5X2 CHSV 1 LYR STRL (GAUZE/BANDAGES/DRESSINGS)
BNDG CMPR 5X2 KNTD ELC UNQ LF (GAUZE/BANDAGES/DRESSINGS)
BNDG CMPR 5X3 KNIT ELC UNQ LF (GAUZE/BANDAGES/DRESSINGS)
BNDG CMPR 75X11 PLY HI ABS (MISCELLANEOUS)
BNDG CMPR 75X21 PLY HI ABS (MISCELLANEOUS)
BNDG CMPR 9X4 STRL LF SNTH (GAUZE/BANDAGES/DRESSINGS)
BNDG COHESIVE 1X5 TAN STRL LF (GAUZE/BANDAGES/DRESSINGS) IMPLANT
BNDG COHESIVE 2X5 TAN ST LF (GAUZE/BANDAGES/DRESSINGS) IMPLANT
BNDG ELASTIC 2INX 5YD STR LF (GAUZE/BANDAGES/DRESSINGS) IMPLANT
BNDG ELASTIC 3INX 5YD STR LF (GAUZE/BANDAGES/DRESSINGS) IMPLANT
BNDG ESMARK 4X9 LF (GAUZE/BANDAGES/DRESSINGS) IMPLANT
BNDG GAUZE 1X75IN STRL (MISCELLANEOUS)
BNDG GAUZE DERMACEA FLUFF 4 (GAUZE/BANDAGES/DRESSINGS) IMPLANT
BNDG GZE DERMACEA 4 6PLY (GAUZE/BANDAGES/DRESSINGS)
BNDG PLASTER X FAST 3X3 WHT LF (CAST SUPPLIES) IMPLANT
BNDG PLSTR 9X3 FST ST WHT (CAST SUPPLIES)
CHLORAPREP W/TINT 26 (MISCELLANEOUS) ×1 IMPLANT
CORD BIPOLAR FORCEPS 12FT (ELECTRODE) ×1 IMPLANT
COVER BACK TABLE 60X90IN (DRAPES) ×1 IMPLANT
COVER MAYO STAND STRL (DRAPES) ×1 IMPLANT
CUFF TOURN SGL QUICK 18X4 (TOURNIQUET CUFF) ×1 IMPLANT
DRAPE EXTREMITY T 121X128X90 (DISPOSABLE) ×1 IMPLANT
DRAPE SURG 17X23 STRL (DRAPES) ×1 IMPLANT
GAUZE SPONGE 4X4 12PLY STRL (GAUZE/BANDAGES/DRESSINGS) ×1 IMPLANT
GAUZE STRETCH 2X75IN STRL (MISCELLANEOUS) IMPLANT
GAUZE XEROFORM 1X8 LF (GAUZE/BANDAGES/DRESSINGS) ×1 IMPLANT
GLOVE BIO SURGEON STRL SZ7.5 (GLOVE) ×1 IMPLANT
GLOVE BIOGEL PI IND STRL 8 (GLOVE) ×1 IMPLANT
GOWN STRL REUS W/ TWL LRG LVL3 (GOWN DISPOSABLE) ×1 IMPLANT
GOWN STRL REUS W/TWL LRG LVL3 (GOWN DISPOSABLE) ×1
NDL HYPO 25X1 1.5 SAFETY (NEEDLE) ×1 IMPLANT
NEEDLE HYPO 25X1 1.5 SAFETY (NEEDLE) ×1
NS IRRIG 1000ML POUR BTL (IV SOLUTION) ×1 IMPLANT
PACK BASIN DAY SURGERY FS (CUSTOM PROCEDURE TRAY) ×1 IMPLANT
PAD CAST 3X4 CTTN HI CHSV (CAST SUPPLIES) IMPLANT
PAD CAST 4YDX4 CTTN HI CHSV (CAST SUPPLIES) IMPLANT
PADDING CAST ABS COTTON 4X4 ST (CAST SUPPLIES) ×1 IMPLANT
PADDING CAST COTTON 3X4 STRL (CAST SUPPLIES)
PADDING CAST COTTON 4X4 STRL (CAST SUPPLIES)
STOCKINETTE 4X48 STRL (DRAPES) ×1 IMPLANT
STRIP CLOSURE SKIN 1/2X4 (GAUZE/BANDAGES/DRESSINGS) IMPLANT
SUT ETHILON 3 0 PS 1 (SUTURE) IMPLANT
SUT ETHILON 4 0 PS 2 18 (SUTURE) ×1 IMPLANT
SYR BULB EAR ULCER 3OZ GRN STR (SYRINGE) ×1 IMPLANT
SYR CONTROL 10ML LL (SYRINGE) ×1 IMPLANT
TOWEL GREEN STERILE FF (TOWEL DISPOSABLE) ×2 IMPLANT
UNDERPAD 30X36 HEAVY ABSORB (UNDERPADS AND DIAPERS) ×1 IMPLANT

## 2023-01-15 NOTE — Anesthesia Preprocedure Evaluation (Signed)
Anesthesia Evaluation  Patient identified by MRN, date of birth, ID band Patient awake    Reviewed: Allergy & Precautions, NPO status , Patient's Chart, lab work & pertinent test results  Airway Mallampati: II  TM Distance: >3 FB Neck ROM: Full    Dental no notable dental hx. (+) Teeth Intact, Dental Advisory Given   Pulmonary sleep apnea    Pulmonary exam normal breath sounds clear to auscultation       Cardiovascular hypertension, Pt. on medications and Pt. on home beta blockers negative cardio ROS Normal cardiovascular exam Rhythm:Regular Rate:Normal     Neuro/Psych  PSYCHIATRIC DISORDERS  Depression     Neuromuscular disease    GI/Hepatic Neg liver ROS,GERD  Medicated,,  Endo/Other  negative endocrine ROS    Renal/GU negative Renal ROS  negative genitourinary   Musculoskeletal  (+) Arthritis , Osteoarthritis,    Abdominal   Peds negative pediatric ROS (+)  Hematology negative hematology ROS (+)   Anesthesia Other Findings   Reproductive/Obstetrics negative OB ROS                             Anesthesia Physical Anesthesia Plan  ASA: 3  Anesthesia Plan: General   Post-op Pain Management:    Induction: Intravenous  PONV Risk Score and Plan: 3 and Ondansetron, Dexamethasone, Midazolam and Treatment may vary due to age or medical condition  Airway Management Planned: LMA  Additional Equipment: None  Intra-op Plan:   Post-operative Plan: Extubation in OR  Informed Consent: I have reviewed the patients History and Physical, chart, labs and discussed the procedure including the risks, benefits and alternatives for the proposed anesthesia with the patient or authorized representative who has indicated his/her understanding and acceptance.     Dental advisory given  Plan Discussed with: CRNA and Anesthesiologist  Anesthesia Plan Comments: ( )        Anesthesia Quick  Evaluation

## 2023-01-15 NOTE — Discharge Instructions (Addendum)
Post Anesthesia Home Care Instructions  Activity: Get plenty of rest for the remainder of the day. A responsible individual must stay with you for 24 hours following the procedure.  For the next 24 hours, DO NOT: -Drive a car -Advertising copywriter -Drink alcoholic beverages -Take any medication unless instructed by your physician -Make any legal decisions or sign important papers.  Meals: Start with liquid foods such as gelatin or soup. Progress to regular foods as tolerated. Avoid greasy, spicy, heavy foods. If nausea and/or vomiting occur, drink only clear liquids until the nausea and/or vomiting subsides. Call your physician if vomiting continues.  Special Instructions/Symptoms: Your throat may feel dry or sore from the anesthesia or the breathing tube placed in your throat during surgery. If this causes discomfort, gargle with warm salt water. The discomfort should disappear within 24 hours.  If you had a scopolamine patch placed behind your ear for the management of post- operative nausea and/or vomiting:  1. The medication in the patch is effective for 72 hours, after which it should be removed.  Wrap patch in a tissue and discard in the trash. Wash hands thoroughly with soap and water. 2. You may remove the patch earlier than 72 hours if you experience unpleasant side effects which may include dry mouth, dizziness or visual disturbances. 3. Avoid touching the patch. Wash your hands with soap and water after contact with the patch.      [facility] Instructions Hand Surgery  Wound Care: Keep your hand elevated above the level of your heart.  Do not allow it to dangle by your side.  Keep the dressing dry and do not remove it unless your doctor advises you to do so.  He will usually change it at the time of your post-op visit.  Moving your fingers is advised to stimulate circulation but will depend on the site of your surgery.  If you have a splint applied, your doctor will advise you  regarding movement.  Activity: Do not drive or operate machinery today.  Rest today and then you may return to your normal activity and work as indicated by your physician.  Diet:  Drink liquids today or eat a light diet.  You may resume a regular diet tomorrow.    General expectations: Pain for two to three days. Fingers may become slightly swollen.  Call your doctor if any of the following occur: Severe pain not relieved by pain medication. Elevated temperature. Dressing soaked with blood. Inability to move fingers. White or bluish color to fingers.   Post Anesthesia Home Care Instructions  Activity: Get plenty of rest for the remainder of the day. A responsible individual must stay with you for 24 hours following the procedure.  For the next 24 hours, DO NOT: -Drive a car -Advertising copywriter -Drink alcoholic beverages -Take any medication unless instructed by your physician -Make any legal decisions or sign important papers.  Meals: Start with liquid foods such as gelatin or soup. Progress to regular foods as tolerated. Avoid greasy, spicy, heavy foods. If nausea and/or vomiting occur, drink only clear liquids until the nausea and/or vomiting subsides. Call your physician if vomiting continues.  Special Instructions/Symptoms: Your throat may feel dry or sore from the anesthesia or the breathing tube placed in your throat during surgery. If this causes discomfort, gargle with warm salt water. The discomfort should disappear within 24 hours.  If you had a scopolamine patch placed behind your ear for the management of post- operative nausea and/or  vomiting:  1. The medication in the patch is effective for 72 hours, after which it should be removed.  Wrap patch in a tissue and discard in the trash. Wash hands thoroughly with soap and water. 2. You may remove the patch earlier than 72 hours if you experience unpleasant side effects which may include dry mouth, dizziness or visual  disturbances. 3. Avoid touching the patch. Wash your hands with soap and water after contact with the patch.    May take Tylenol after 5:51 if needed.

## 2023-01-15 NOTE — Op Note (Signed)
NAME: [name] MEDICAL RECORD NO: [id number] DATE OF BIRTH: 1960/10/08 FACILITY: [facility] LOCATION: [facility] PHYSICIAN: Tami Ribas, MD   OPERATIVE REPORT   DATE OF PROCEDURE: 01/15/23    PREOPERATIVE DIAGNOSIS: Left ring finger annular ligament cyst   POSTOPERATIVE DIAGNOSIS: Left ring finger annular ligament cyst   PROCEDURE: Excision of annular ligament cyst left ring finger   SURGEON:  Betha Loa, M.D.   ASSISTANT: none   ANESTHESIA:  General   INTRAVENOUS FLUIDS:  Per anesthesia flow sheet.   ESTIMATED BLOOD LOSS:  Minimal.   COMPLICATIONS:  None.   SPECIMENS: Left ring finger annular ligament cyst to pathology   TOURNIQUET TIME:    Total Tourniquet Time Documented: Upper Arm (Left) - 9 minutes Total: Upper Arm (Left) - 9 minutes    DISPOSITION:  Stable to PACU.   INDICATIONS: 62 year old female with a parent annular ligament cyst at the base of the left ring finger.  It is bothersome to her.  She wishes to have it removed.  Risks, benefits and alternatives of surgery were discussed including the risks of blood loss, infection, damage to nerves, vessels, tendons, ligaments, bone for surgery, need for additional surgery, complications with wound healing, continued pain, stiffness, , recurrence.  She voiced understanding of these risks and elected to proceed.  OPERATIVE COURSE:  After being identified preoperatively by myself,  the patient and I agreed on the procedure and site of the procedure.  The surgical site was marked.  Surgical consent had been signed. Preoperative IV antibiotic prophylaxis was given. She was transferred to the operating room and placed on the operating table in supine position with the Left upper extremity on an arm board.  General anesthesia was induced by the anesthesiologist.  Left upper extremity was prepped and draped in normal sterile orthopedic fashion.  A surgical pause was performed between the surgeons,  anesthesia, and operating room staff and all were in agreement as to the patient, procedure, and site of procedure.  Tourniquet at the proximal aspect of the extremity was inflated to 250 mmHg after exsanguination of the arm with an Esmarch bandage.  Incision was made over the palpable cyst at the base of the ring finger just proximal to the proximal finger flexion crease.  This was carried in subcutaneous tissues by spreading technique.  The cyst was easily identified.  It was carefully freed up of surrounding soft tissues.  It was attached to the underlying annular ligament.  It was easily freed up.  There was no distinct stalk but an apparent rent in the flexor sheath.  The sheath surrounding this rent was carefully excised.  This was at the level of the A2 pulley.  The cyst was sent to pathology for examination.  The wound was irrigated with sterile saline and closed with 4-0 nylon in a horizontal mattress fashion.  Digital block was performed with quarter percent plain Marcaine to aid in postoperative analgesia.  Wound was dressed with sterile Xeroform 4 x 4's and wrapped with a Coban dressing lightly.  The tourniquet was deflated at 9 minutes.  Fingertips were pink with brisk capillary refill after deflation of tourniquet.  The operative  drapes were broken down.  The patient was awoken from anesthesia safely.  She was transferred back to the stretcher and taken to PACU in stable condition.  I will see her back in the office in 1 week for postoperative followup.  I will give her a prescription for Tramadol 50  mg 1-2 tabs PO q6 hours prn pain, dispense # 20.   Betha Loa, MD Electronically signed, 01/15/23

## 2023-01-15 NOTE — Anesthesia Procedure Notes (Signed)
Procedure Name: LMA Insertion Date/Time: 01/15/2023 2:03 PM  Performed by: Alvera Novel, CRNAPre-anesthesia Checklist: Patient identified, Emergency Drugs available, Suction available and Patient being monitored Patient Re-evaluated:Patient Re-evaluated prior to induction Oxygen Delivery Method: Circle System Utilized Preoxygenation: Pre-oxygenation with 100% oxygen Induction Type: IV induction Ventilation: Mask ventilation without difficulty LMA: LMA inserted LMA Size: 4.0 Number of attempts: 1 Airway Equipment and Method: Bite block Placement Confirmation: positive ETCO2 Tube secured with: Tape Dental Injury: Teeth and Oropharynx as per pre-operative assessment

## 2023-01-15 NOTE — Anesthesia Postprocedure Evaluation (Signed)
Anesthesia Post Note  Patient: [name]  Procedure(s) Performed: EXCISION LEFT RING FINGER ANNULAR LIGAMENT CYST (Left: Hand)     Patient location during evaluation: PACU Anesthesia Type: General Level of consciousness: awake Pain management: pain level controlled Vital Signs Assessment: post-procedure vital signs reviewed and stable Respiratory status: spontaneous breathing, nonlabored ventilation and respiratory function stable Cardiovascular status: blood pressure returned to baseline and stable Postop Assessment: no apparent nausea or vomiting Anesthetic complications: no   No notable events documented.  Last Vitals:  Vitals:   01/15/23 1500 01/15/23 1515  BP: (!) 148/69 (!) 158/77  Pulse: (!) 45 (!) 50  Resp: 16 17  Temp:  (!) 36.3 C  SpO2: 96% 98%    Last Pain:  Vitals:   01/15/23 1515  TempSrc:   PainSc: 0-No pain                 Froilan Mclean P Tina Gruner

## 2023-01-15 NOTE — Transfer of Care (Signed)
Immediate Anesthesia Transfer of Care Note  Patient: [name]  Procedure(s) Performed: EXCISION LEFT RING FINGER ANNULAR LIGAMENT CYST (Left: Hand)  Patient Location: PACU  Anesthesia Type:General  Level of Consciousness: awake and alert   Airway & Oxygen Therapy: Patient Spontanous Breathing and Patient connected to face mask oxygen  Post-op Assessment: Report given to RN and Post -op Vital signs reviewed and stable  Post vital signs: Reviewed and stable  Last Vitals:  Vitals Value Taken Time  BP 124/64 01/15/23 1432  Temp    Pulse 50 01/15/23 1440  Resp 17 01/15/23 1440  SpO2 100 % 01/15/23 1440  Vitals shown include unfiled device data.  Last Pain:  Vitals:   01/15/23 1143  TempSrc: Tympanic  PainSc: 0-No pain      Patients Stated Pain Goal: 5 (01/15/23 1143)  Complications: No notable events documented.

## 2023-01-15 NOTE — H&P (Signed)
[name] is an 62 y.o. female.   Chief Complaint: finger cyst HPI: 62 yo female with left ring finger annular ligament cyst.  It is bothersome to her and she wishes to have it removed.  Allergies:  Allergies  Allergen Reactions   Buprenorphine Hcl Itching   Morphine And Codeine Itching   Morphine Sulfate Other (See Comments)   Other Other (See Comments)   Short Ragweed Pollen Ext Hives    Past Medical History:  Diagnosis Date   Arthritis    low back   Asthma    Back pain    Baker's cyst of knee, right    Constipation    GERD (gastroesophageal reflux disease)    Heart burn    Hyperlipidemia    Hypertension    IBS (irritable bowel syndrome)    Joint pain    Lactose intolerance    Muscle pain    Other fatigue    Palpitations    Pre-diabetes    PVC (premature ventricular contraction)    Ringing in ear    Sleep apnea    SOB (shortness of breath) on exertion    Urticaria    Vitamin D deficiency     Past Surgical History:  Procedure Laterality Date   APPENDECTOMY     carpal tunn     CESAREAN SECTION     KNEE ARTHROSCOPY WITH LATERAL MENISECTOMY Right 03/08/2021   Procedure: RIGHT KNEE PARTIAL LATERAL MENISCECTOMY;  Surgeon: Tarry Kos, MD;  Location: [facility];  Service: Orthopedics;  Laterality: Right;   NEUROMA SURGERY     SEPTOPLASTY     SINOSCOPY     TONSILLECTOMY     TOTAL ABDOMINAL HYSTERECTOMY W/ BILATERAL SALPINGOOPHORECTOMY     TUBAL LIGATION      Family History: Family History  Problem Relation Age of Onset   Sudden death Mother    Asthma Father    Hypertension Father    Stroke Father    Cancer Father    Alcoholism Father    Hyperlipidemia Sister    Hyperlipidemia Brother    Allergic rhinitis Maternal Grandmother    Eczema Neg Hx    Urticaria Neg Hx     Social History:   reports that she has never smoked. She has never used smokeless tobacco. She reports that she does not currently use alcohol. She reports that  she does not use drugs.  Medications: Medications Prior to Admission  Medication Sig Dispense Refill   aspirin 81 MG chewable tablet Chew 1 tablet (81 mg total) by mouth daily.     atorvastatin (LIPITOR) 40 MG tablet Take 1 tablet by mouth once daily 90 tablet 1   chlorthalidone (HYGROTON) 25 MG tablet Take 1 tablet by mouth once daily 90 tablet 2   estradiol (VIVELLE-DOT) 0.05 MG/24HR patch Place 1 patch onto the skin 2 (two) times a week.   0   flecainide (TAMBOCOR) 150 MG tablet Take 1/2 (one-half) tablet by mouth twice daily 90 tablet 2   metoprolol succinate (TOPROL-XL) 50 MG 24 hr tablet TAKE 1 TABLET BY MOUTH ONCE DAILY WITH MEALS OR  IMMEDIATELY  AFTER 90 tablet 2   potassium chloride SA (KLOR-CON M) 20 MEQ tablet Take 1 tablet (20 mEq total) by mouth daily. 90 tablet 3   Vitamin D, Ergocalciferol, (DRISDOL) 1.25 MG (50000 UNIT) CAPS capsule Take 1 capsule (50,000 Units total) by mouth every 7 (seven) days. 4 capsule 0   carboxymethylcellulose (REFRESH PLUS) 0.5 %  SOLN 1 drop daily as needed. 2 drops each eye     cyclobenzaprine (FLEXERIL) 10 MG tablet Take 10 mg by mouth 2 (two) times daily as needed.     famotidine (PEPCID) 20 MG tablet Take 1 tablet (20 mg total) by mouth daily. 90 tablet 3   levalbuterol (XOPENEX HFA) 45 MCG/ACT inhaler Inhale 2 puffs into the lungs every 6 (six) hours as needed for wheezing. 1 each 5   montelukast (SINGULAIR) 10 MG tablet Take 10 mg by mouth daily as needed.     Probiotic Product (PROBIOTIC-10 PO) Take by mouth as needed.      No results found for this or any previous visit (from the past 48 hour(s)).  No results found.    Blood pressure (!) 148/71, pulse (!) 49, temperature 98.2 F (36.8 C), temperature source Tympanic, resp. rate 16, height 5\' 4"  (1.626 m), weight 90.2 kg, SpO2 100%.  General appearance: alert, cooperative, and appears stated age Head: Normocephalic, without obvious abnormality, atraumatic Neck: supple, symmetrical,  trachea midline Extremities: Intact sensation and capillary refill all digits.  +epl/fpl/io.  No wounds.  Pulses: 2+ and symmetric Skin: Skin color, texture, turgor normal. No rashes or lesions Neurologic: Grossly normal Incision/Wound: none  Assessment/Plan Left ring finger annular ligament cyst.  Non operative and operative treatment options have been discussed with the patient and patient wishes to proceed with operative treatment. Risks, benefits, and alternatives of surgery have been discussed and the patient agrees with the plan of care.   Betha Loa 01/15/2023, 12:58 PM

## 2023-01-16 ENCOUNTER — Encounter (HOSPITAL_BASED_OUTPATIENT_CLINIC_OR_DEPARTMENT_OTHER): Payer: Self-pay | Admitting: Orthopedic Surgery

## 2023-01-16 LAB — SURGICAL PATHOLOGY

## 2023-01-17 ENCOUNTER — Encounter (INDEPENDENT_AMBULATORY_CARE_PROVIDER_SITE_OTHER): Payer: Self-pay | Admitting: Family Medicine

## 2023-01-17 ENCOUNTER — Ambulatory Visit (INDEPENDENT_AMBULATORY_CARE_PROVIDER_SITE_OTHER): Payer: Managed Care, Other (non HMO) | Admitting: Family Medicine

## 2023-01-17 VITALS — BP 130/75 | HR 52 | Temp 98.3°F | Ht 64.0 in | Wt 198.0 lb

## 2023-01-17 DIAGNOSIS — Z6833 Body mass index (BMI) 33.0-33.9, adult: Secondary | ICD-10-CM

## 2023-01-17 DIAGNOSIS — E559 Vitamin D deficiency, unspecified: Secondary | ICD-10-CM | POA: Diagnosis not present

## 2023-01-17 DIAGNOSIS — E669 Obesity, unspecified: Secondary | ICD-10-CM

## 2023-01-17 DIAGNOSIS — Z6834 Body mass index (BMI) 34.0-34.9, adult: Secondary | ICD-10-CM

## 2023-01-17 MED ORDER — VITAMIN D (ERGOCALCIFEROL) 1.25 MG (50000 UNIT) PO CAPS: 50000.0000 [IU] | ORAL_CAPSULE | ORAL | 0 refills | Status: DC

## 2023-01-17 NOTE — Progress Notes (Signed)
[facility]  Office: [phone]  /  Fax: [phone]  WEIGHT SUMMARY AND BIOMETRICS  Anthropometric Measurements Height: 5\' 4"  (1.626 m) Weight: 198 lb (89.8 kg) BMI (Calculated): 33.97 Weight at Last Visit: 194 lb Weight Lost Since Last Visit: 0 Weight Gained Since Last Visit: 4 lb Starting Weight: 194 lb. Total Weight Loss (lbs): 30 lb (13.6 kg)   Body Composition  Body Fat %: 45.1 % Fat Mass (lbs): 89.4 lbs Muscle Mass (lbs): 103.4 lbs Total Body Water (lbs): 74.4 lbs Visceral Fat Rating : 13   Other Clinical Data Fasting: no Labs: no Today's Visit #: 36 Starting Date: 07/20/20    Chief Complaint: OBESITY   History of Present Illness   The patient, with a history of obesity and vitamin D deficiency, presents for a routine follow-up. She reports a weight gain of four pounds over the past month despite attempts to maintain a food journal and regular exercise regimen of 20-30 minutes three times per week. She also recently underwent a minor hand procedure, which is healing well with manageable pain levels.  The patient expresses frustration with her weight management, feeling as though her efforts are not yielding the desired results. She acknowledges a struggle with meeting her protein intake goals, which may be contributing to her weight gain. She expresses a willingness to modify her dietary habits to boost her metabolism and facilitate weight loss.  The patient also reports taking prescription vitamin D and requests a refill. Her last vitamin D level was reported to be within the normal range. She has an upcoming appointment scheduled for December.          PHYSICAL EXAM:  Blood pressure 130/75, pulse (!) 52, temperature 98.3 F (36.8 C), height 5\' 4"  (1.626 m), weight 198 lb (89.8 kg), SpO2 99%. Body mass index is 33.99 kg/m.  DIAGNOSTIC DATA REVIEWED:  BMET    Component Value Date/Time   NA 141 01/10/2023 1423   NA 141 10/24/2022 1026   K 3.7 01/10/2023  1423   CL 103 01/10/2023 1423   CO2 29 01/10/2023 1423   GLUCOSE 113 (H) 01/10/2023 1423   BUN 16 01/10/2023 1423   BUN 14 10/24/2022 1026   CREATININE 0.93 01/10/2023 1423   CREATININE 0.72 02/05/2014 1620   CALCIUM 9.4 01/10/2023 1423   GFRNONAA >60 01/10/2023 1423   GFRAA >60 04/18/2019 0205   Lab Results  Component Value Date   HGBA1C 5.6 10/24/2022   HGBA1C 6.0 (H) 07/20/2020   Lab Results  Component Value Date   INSULIN 5.4 10/24/2022   INSULIN 17.8 07/20/2020   Lab Results  Component Value Date   TSH 2.630 05/30/2022   CBC    Component Value Date/Time   WBC 6.0 01/04/2022 0829   WBC 9.2 04/18/2019 0205   RBC 4.24 01/04/2022 0829   RBC 3.77 (L) 04/18/2019 0205   HGB 13.2 01/04/2022 0829   HCT 39.6 01/04/2022 0829   PLT 310 01/04/2022 0829   MCV 93 01/04/2022 0829   MCH 31.1 01/04/2022 0829   MCH 29.7 04/18/2019 0205   MCHC 33.3 01/04/2022 0829   MCHC 32.0 04/18/2019 0205   RDW 11.8 01/04/2022 0829   Iron Studies    Component Value Date/Time   FERRITIN 1,561 (H) 04/13/2019 1050   Lipid Panel     Component Value Date/Time   CHOL 160 10/24/2022 1026   TRIG 56 10/24/2022 1026   HDL 58 10/24/2022 1026   CHOLHDL 3.3 12/23/2018 1322   LDLCALC 91  10/24/2022 1026   Hepatic Function Panel     Component Value Date/Time   PROT 6.9 10/24/2022 1026   ALBUMIN 4.4 10/24/2022 1026   AST 13 10/24/2022 1026   ALT 15 10/24/2022 1026   ALKPHOS 60 10/24/2022 1026   BILITOT 1.2 10/24/2022 1026   BILIDIR 0.18 02/20/2018 0914      Component Value Date/Time   TSH 2.630 05/30/2022 0755   Nutritional Lab Results  Component Value Date   VD25OH 59.8 10/24/2022   VD25OH 58.9 05/30/2022   VD25OH 55.9 01/04/2022     Assessment and Plan    Obesity Weight increased by 4 pounds over the last month. Discussed the importance of consistent protein intake and maintaining a food journal. Patient is currently exercising 20-30 minutes three times per week. Discussed  various dietary plans to boost metabolism and facilitate weight loss. -Adopt a low carbohydrate plan with specific food list given and discussed in depth today. -Encouraged to maintain current exercise regimen.  Vitamin D deficiency Currently on prescription Vitamin D and last level was 59. -Refill prescription Vitamin D.   Follow-up in December 2024.         I have personally spent 30 minutes total time today in preparation, patient care, and documentation for this visit, including the following: review of clinical lab tests; review of medical tests/procedures/services.    She was informed of the importance of frequent follow up visits to maximize her success with intensive lifestyle modifications for her multiple health conditions.    Quillian Quince, MD

## 2023-01-28 ENCOUNTER — Encounter: Payer: Self-pay | Admitting: Cardiology

## 2023-01-28 ENCOUNTER — Ambulatory Visit: Payer: Managed Care, Other (non HMO) | Attending: Cardiology | Admitting: Cardiology

## 2023-01-28 VITALS — BP 127/72 | HR 55 | Ht 64.0 in | Wt 196.0 lb

## 2023-01-28 DIAGNOSIS — G4733 Obstructive sleep apnea (adult) (pediatric): Secondary | ICD-10-CM | POA: Diagnosis not present

## 2023-01-28 DIAGNOSIS — I10 Essential (primary) hypertension: Secondary | ICD-10-CM

## 2023-01-28 NOTE — Progress Notes (Signed)
SLEEP MEDICINE VIRTUAL VISIT via Video Note   Because of [name]'s co-morbid illnesses, she is at least at moderate risk for complications without adequate follow up.  This format is felt to be most appropriate for this patient at this time.  All issues noted in this document were discussed and addressed.  A limited physical exam was performed with this format.  Please refer to the patient's chart for her consent to telehealth for [facility].   Date:  01/28/2023   ID:  [name], DOB 03/21/1960, MRN [id number] The patient was identified using 2 identifiers.  Patient Location: Home Provider Location: Office/Clinic   PCP:  Camie Patience, FNP   [facility] Providers Cardiologist:  Armanda Magic, MD Electrophysiologist:  Lewayne Bunting, MD     Evaluation Performed:  Follow-Up Visit  Chief Complaint:  OSA  History of Present Illness:    [name] is a 62 y.o. 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 This improved with reduction of HRT. She had a 2D echo in 2014 that showed normal LVEF at 65%, trivial AI and trivial MR with grade 1 DD. She is on medical therapy for her HTN and DLD with Toprol , HCTZ and simvastatin. Both chronic conditions are followed by her PCP. Dr. Okey Regal Web.  She was seen by Dr. Ladona Ridgel with EP in Sept for her PVCs and is on Flecainide and metoprolol for suppression.    She had a home sleep study April 2021 showing mild OSA with an AHI of 12.7/hr and O2 sats as low as 76%.   She ultimately was placed on CPAP at 13 cm H2O.  When I last saw her she was having problems with her mask.  She had very poor compliance easily 7% of the time greater than 4 hours a night.  I ordered her a new ResMed under the nose fullface mask with a fitting through DME and she is now back for follow-up.  She is doing better with her new mask. She feels pressure is adequate.  Since going on  PAP she feels rested in the am and has no significant daytime sleepiness.  She denies any significant nasal dryness or nasal congestion.  She still has problems with mouth dryness.  She does not think that she snores.    Past Medical History:  Diagnosis Date   Arthritis    low back   Asthma    Back pain    Baker's cyst of knee, right    Constipation    GERD (gastroesophageal reflux disease)    Heart burn    Hyperlipidemia    Hypertension    IBS (irritable bowel syndrome)    Joint pain    Lactose intolerance    Muscle pain    Other fatigue    Palpitations    Pre-diabetes    PVC (premature ventricular contraction)    Ringing in ear    Sleep apnea    SOB (shortness of breath) on exertion    Urticaria    Vitamin D deficiency    Past Surgical History:  Procedure Laterality Date   APPENDECTOMY     carpal tunn     CESAREAN SECTION     CYST EXCISION Left 01/15/2023   Procedure: EXCISION LEFT RING FINGER ANNULAR LIGAMENT CYST;  Surgeon: Betha Loa, MD;  Location: [facility];  Service: Orthopedics;  Laterality: Left;  30 MIN   KNEE ARTHROSCOPY WITH LATERAL MENISECTOMY Right 03/08/2021   Procedure: RIGHT KNEE PARTIAL LATERAL MENISCECTOMY;  Surgeon: Tarry Kos, MD;  Location: [facility];  Service: Orthopedics;  Laterality: Right;   NEUROMA SURGERY     SEPTOPLASTY     SINOSCOPY     TONSILLECTOMY     TOTAL ABDOMINAL HYSTERECTOMY W/ BILATERAL SALPINGOOPHORECTOMY     TUBAL LIGATION       No outpatient medications have been marked as taking for the 01/28/23 encounter (Video Visit) with Quintella Reichert, MD.     Allergies:   Buprenorphine hcl, Morphine and codeine, Morphine sulfate, Other, and Short ragweed pollen ext   Social History   Tobacco Use   Smoking status: Never   Smokeless tobacco: Never  Vaping Use   Vaping status: Never Used  Substance Use Topics   Alcohol use: Not Currently   Drug use: Never     Family Hx: The patient's  family history includes Alcoholism in her father; Allergic rhinitis in her maternal grandmother; Asthma in her father; Cancer in her father; Hyperlipidemia in her brother and sister; Hypertension in her father; Stroke in her father; Sudden death in her mother. There is no history of Eczema or Urticaria.  ROS:   Please see the history of present illness.     All other systems reviewed and are negative.   Prior Sleep studies:   The following studies were reviewed today:  PAP compliance download  Labs/Other Tests and Data Reviewed:     Recent Labs: 05/30/2022: TSH 2.630 10/24/2022: ALT 15 01/10/2023: BUN 16; Creatinine, Ser 0.93; Potassium 3.7; Sodium 141   Wt Readings from Last 3 Encounters:  01/17/23 198 lb (89.8 kg)  01/15/23 198 lb 13.7 oz (90.2 kg)  12/20/22 194 lb (88 kg)     Risk Assessment/Calculations:      Objective:    Vital Signs:  There were no vitals taken for this visit.   VITAL SIGNS:  reviewed GEN:  no acute distress EYES:  sclerae anicteric, EOMI - Extraocular Movements Intact RESPIRATORY:  normal respiratory effort, symmetric expansion CARDIOVASCULAR:  no peripheral edema SKIN:  no rash, lesions or ulcers. MUSCULOSKELETAL:  no obvious deformities. NEURO:  alert and oriented x 3, no obvious focal deficit PSYCH:  normal affect  ASSESSMENT & PLAN:    OSA - The patient is tolerating PAP therapy well without any problems. The PAP download performed by his DME was personally reviewed and interpreted by me today and showed an AHI of 0.4 /hr on 13 cm H2O with 63% compliance in using more than 4 hours nightly.  The patient has been using and benefiting from PAP use and will continue to benefit from therapy.  -I encouraged her to be more compliant with her device  Hypertension -BP has been controlled at home -Continue prescription drug management with chlorthalidone 25 mg daily, Toprol-XL 50 mg daily with as needed refills    Total time of encounter: 15  minutes total time of encounter, including 10 minutes spent in face-to-face patient care on the date of this encounter. This time includes coordination of care and counseling regarding above mentioned problem list. Remainder of non-face-to-face time involved reviewing chart documents/testing relevant to the patient encounter and documentation in the medical record. I have independently reviewed documentation from referring provider.    Medication Adjustments/Labs and Tests Ordered: Current medicines are reviewed at length with the patient today.  Concerns regarding medicines are outlined above.  Tests Ordered: No orders of the defined types were placed in this encounter.   Medication Changes: No orders of the defined types were placed in this encounter.   Follow Up:  In Person in 1 year(s)  Signed, Armanda Magic, MD  01/28/2023 8:50 AM    [facility]

## 2023-01-28 NOTE — Patient Instructions (Signed)
Medication Instructions:  Your physician recommends that you continue on your current medications as directed. Please refer to the Current Medication list given to you today.  *If you need a refill on your cardiac medications before your next appointment, please call your pharmacy*   Lab Work: None.  If you have labs (blood work) drawn today and your tests are completely normal, you will receive your results only by: MyChart Message (if you have MyChart) OR A paper copy in the mail If you have any lab test that is abnormal or we need to change your treatment, we will call you to review the results.   Testing/Procedures: None.   Follow-Up:   Your next appointment:   1 year(s)  Provider:   Traci Turner, MD     

## 2023-02-25 ENCOUNTER — Ambulatory Visit (INDEPENDENT_AMBULATORY_CARE_PROVIDER_SITE_OTHER): Payer: Commercial Managed Care - HMO | Admitting: Family Medicine

## 2023-02-25 ENCOUNTER — Encounter (INDEPENDENT_AMBULATORY_CARE_PROVIDER_SITE_OTHER): Payer: Self-pay | Admitting: Family Medicine

## 2023-02-25 VITALS — BP 135/74 | HR 55 | Temp 98.0°F | Ht 64.0 in | Wt 199.0 lb

## 2023-02-25 DIAGNOSIS — Z6834 Body mass index (BMI) 34.0-34.9, adult: Secondary | ICD-10-CM | POA: Diagnosis not present

## 2023-02-25 DIAGNOSIS — E669 Obesity, unspecified: Secondary | ICD-10-CM

## 2023-02-25 DIAGNOSIS — L292 Pruritus vulvae: Secondary | ICD-10-CM

## 2023-02-25 DIAGNOSIS — E559 Vitamin D deficiency, unspecified: Secondary | ICD-10-CM | POA: Diagnosis not present

## 2023-02-25 DIAGNOSIS — B379 Candidiasis, unspecified: Secondary | ICD-10-CM | POA: Insufficient documentation

## 2023-02-25 MED ORDER — VITAMIN D (ERGOCALCIFEROL) 1.25 MG (50000 UNIT) PO CAPS: 50000.0000 [IU] | ORAL_CAPSULE | ORAL | 0 refills | Status: DC

## 2023-02-25 NOTE — Progress Notes (Signed)
.smr  Office: [phone]  /  Fax: [phone]  WEIGHT SUMMARY AND BIOMETRICS  Anthropometric Measurements Height: 5\' 4"  (1.626 m) Weight: 199 lb (90.3 kg) BMI (Calculated): 34.14 Weight at Last Visit: 198 lb Weight Lost Since Last Visit: 0 Weight Gained Since Last Visit: 1 Starting Weight: 194 lb Total Weight Loss (lbs): 29 lb (13.2 kg)   Body Composition  Body Fat %: 45.1 % Fat Mass (lbs): 89.8 lbs Muscle Mass (lbs): 103.6 lbs Total Body Water (lbs): 75.8 lbs Visceral Fat Rating : 13   Other Clinical Data Fasting: no Labs: no Today's Visit #: 37 Starting Date: 07/20/20    Chief Complaint: OBESITY  History of Present Illness   The patient, diagnosed with vitamin D deficiency and obesity, presents for a follow-up consultation. She reports a weight gain of one pound over the past six weeks since her last visit. Despite a change to a low-carb diet, she admits to adhering to the new eating plan only about 50% of the time. She engages in physical activity, walking 2-3 times per week for 15-30 minutes each session.  The patient expresses difficulty in maintaining focus on her diet and exercise regimen, often allowing other concerns to take precedence. She describes a pattern of starting strong in the first couple of weeks, then gradually letting other issues interfere. She admits to occasional late-night snacking on non-ideal foods like cereal or yogurt. She expresses frustration and a sense of struggle, noting that she sometimes feels like giving up but is determined not to revert to her previous state.  The patient also discusses feelings of being overwhelmed, attributing this to her role as a caregiver within her family. 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 She describes herself as the person who feels the need to look after everyone and ensure they're okay. She mentions  that two family members were in the hospital over the holidays, which added to her stress and distracted her from her health goals.  The patient acknowledges that she needs to focus more on her own health and well-being. She expresses a desire to diversify her protein sources beyond meat, mentioning yogurt, cheese, and tuna as alternatives. She also admits to over-snacking on nuts, which she recognizes as a problem. She expresses a need to shift her mindset and focus more on what she should be eating rather than what she shouldn't.  Regarding her medication, the patient is on prescription vitamin D (50,000 international units weekly) and requests a refill. She reports no changes in her other medications. She also mentions a possible yeast infection, which she believes may be due to a change in bath soap. She expresses a desire to treat this potential infection with over-the-counter medication.          PHYSICAL EXAM:  Blood pressure 135/74, pulse (!) 55, temperature 98 F (36.7 C), height 5\' 4"  (1.626 m), weight 199 lb (90.3 kg), SpO2 98%. Body mass index is 34.16 kg/m.  DIAGNOSTIC DATA REVIEWED:  BMET    Component Value Date/Time   NA 141 01/10/2023 1423   NA 141 10/24/2022 1026   K 3.7 01/10/2023 1423   CL 103 01/10/2023 1423   CO2 29 01/10/2023 1423   GLUCOSE 113 (H) 01/10/2023 1423   BUN 16 01/10/2023 1423   BUN 14 10/24/2022 1026   CREATININE 0.93 01/10/2023 1423   CREATININE 0.72 02/05/2014 1620  CALCIUM 9.4 01/10/2023 1423   GFRNONAA >60 01/10/2023 1423   GFRAA >60 04/18/2019 0205   Lab Results  Component Value Date   HGBA1C 5.6 10/24/2022   HGBA1C 6.0 (H) 07/20/2020   Lab Results  Component Value Date   INSULIN 5.4 10/24/2022   INSULIN 17.8 07/20/2020   Lab Results  Component Value Date   TSH 2.630 05/30/2022   CBC    Component Value Date/Time   WBC 6.0 01/04/2022 0829   WBC 9.2 04/18/2019 0205   RBC 4.24 01/04/2022 0829   RBC 3.77 (L) 04/18/2019 0205    HGB 13.2 01/04/2022 0829   HCT 39.6 01/04/2022 0829   PLT 310 01/04/2022 0829   MCV 93 01/04/2022 0829   MCH 31.1 01/04/2022 0829   MCH 29.7 04/18/2019 0205   MCHC 33.3 01/04/2022 0829   MCHC 32.0 04/18/2019 0205   RDW 11.8 01/04/2022 0829   Iron Studies    Component Value Date/Time   FERRITIN 1,561 (H) 04/13/2019 1050   Lipid Panel     Component Value Date/Time   CHOL 160 10/24/2022 1026   TRIG 56 10/24/2022 1026   HDL 58 10/24/2022 1026   CHOLHDL 3.3 12/23/2018 1322   LDLCALC 91 10/24/2022 1026   Hepatic Function Panel     Component Value Date/Time   PROT 6.9 10/24/2022 1026   ALBUMIN 4.4 10/24/2022 1026   AST 13 10/24/2022 1026   ALT 15 10/24/2022 1026   ALKPHOS 60 10/24/2022 1026   BILITOT 1.2 10/24/2022 1026   BILIDIR 0.18 02/20/2018 0914      Component Value Date/Time   TSH 2.630 05/30/2022 0755   Nutritional Lab Results  Component Value Date   VD25OH 59.8 10/24/2022   VD25OH 58.9 05/30/2022   VD25OH 55.9 01/04/2022     Assessment and Plan    Obesity Gained one pound since last visit six weeks ago. Following a low-carb diet 50% of the time and walking 2-3 times per week for 15-30 minutes. Difficulty maintaining focus on diet and exercise due to caregiving responsibilities and feeling overwhelmed. Discussed focusing on what to eat, spreading out protein intake, and incorporating non-meat protein sources. Advised avoiding high-calorie snacks like nuts and considering healthier options. Emphasized setting oneself up for success and not comparing to others. Highlighted that gaining only one pound is relatively good compared to the average [place] gaining 13 pounds between Halloween and New Year's. - Encourage focusing on what to eat rather than what to avoid - Recommend spreading out protein intake and incorporating non-meat protein sources such as yogurt, cheese, and tuna - Advise avoiding high-calorie snacks like nuts and considering healthier options -  Task to pick one achievable goal and focus on it - Schedule follow-up in four weeks  Suspected Yeast Infection Reports irritation and itching potentially due to new bath soap, similar to previous yeast infections after antibiotic use. Discussed over-the-counter treatments with a 3-7 day regimen and advised against 1-day treatments due to potential irritation. Informed that if symptoms do not resolve, a medical appointment is necessary as other conditions can mimic yeast infections. - Recommend over-the-counter treatment with a 3-7 day regimen - Advise against 1-day treatments due to potential irritation - Instruct to seek medical appointment if symptoms do not resolve  Vitamin D Deficiency Chronic vitamin D deficiency managed with prescription vitamin D 50,000 IU weekly. Requests refill. - Refill prescription for vitamin D 50,000 IU weekly  General Health Maintenance Blood pressure well-controlled despite stress. - Continue current medications -  Monitor blood pressure regularly.      She was informed of the importance of frequent follow up visits to maximize her success with intensive lifestyle modifications for her multiple health conditions.    Quillian Quince, MD

## 2023-03-17 ENCOUNTER — Other Ambulatory Visit: Payer: Self-pay | Admitting: Internal Medicine

## 2023-03-17 DIAGNOSIS — I493 Ventricular premature depolarization: Secondary | ICD-10-CM

## 2023-03-25 ENCOUNTER — Encounter (INDEPENDENT_AMBULATORY_CARE_PROVIDER_SITE_OTHER): Payer: Self-pay | Admitting: Family Medicine

## 2023-03-25 ENCOUNTER — Ambulatory Visit (INDEPENDENT_AMBULATORY_CARE_PROVIDER_SITE_OTHER): Payer: Commercial Managed Care - HMO | Admitting: Family Medicine

## 2023-03-25 VITALS — BP 138/82 | HR 59 | Temp 98.2°F | Ht 64.0 in | Wt 200.0 lb

## 2023-03-25 DIAGNOSIS — Z6834 Body mass index (BMI) 34.0-34.9, adult: Secondary | ICD-10-CM | POA: Diagnosis not present

## 2023-03-25 DIAGNOSIS — I10 Essential (primary) hypertension: Secondary | ICD-10-CM | POA: Diagnosis not present

## 2023-03-25 DIAGNOSIS — E559 Vitamin D deficiency, unspecified: Secondary | ICD-10-CM | POA: Diagnosis not present

## 2023-03-25 DIAGNOSIS — E669 Obesity, unspecified: Secondary | ICD-10-CM

## 2023-03-25 MED ORDER — VITAMIN D (ERGOCALCIFEROL) 1.25 MG (50000 UNIT) PO CAPS: 50000.0000 [IU] | ORAL_CAPSULE | ORAL | 0 refills | Status: DC

## 2023-03-25 NOTE — Progress Notes (Signed)
 .smr  Office: [phone]  /  Fax: [phone]  WEIGHT SUMMARY AND BIOMETRICS  Anthropometric Measurements Height: 5' 4 (1.626 m) Weight: 200 lb (90.7 kg) BMI (Calculated): 34.31 Weight at Last Visit: 199 lb Weight Lost Since Last Visit: 0 lb Weight Gained Since Last Visit: 1 lb Starting Weight: 194 lb Total Weight Loss (lbs): 28 lb (12.7 kg)   Body Composition  Body Fat %: 45 % Fat Mass (lbs): 90.4 lbs Muscle Mass (lbs): 104.8 lbs Total Body Water (lbs): 75.6 lbs Visceral Fat Rating : 13   Other Clinical Data Fasting: no Labs: no Today's Visit #: 42 Starting Date: 07/20/20    Chief Complaint: OBESITY   Discussed the use of AI scribe software for clinical note transcription with the patient, who gave verbal consent to proceed.  History of Present Illness   The patient, diagnosed with obesity and vitamin D  deficiency, presents for a follow-up visit. She reports a weight gain of one pound over the past month, which included the holiday season. She has been adhering to a combination of category two and low carb diet about 75% of the time. She also reports engaging in physical activities such as walking and biking for about 30 minutes, three times per week.  The patient is currently on prescription vitamin D , taking fifty thousand international units every seven days. She expresses a desire to declutter her kitchen to help her stay on track with her diet. She has been more conscious about her protein intake, even when snacking, and is trying to reset her mindset towards her health and diet after the holiday season.  The patient also mentions using an exercise bike indoors for physical activity, often while watching TV or using her phone. She expresses a preference for the category two diet but appreciates the flexibility of adding more protein or vegetables when desired. She has no new medications and is due for a refill on her vitamin D  prescription. She is scheduled for  fasting labs in February.  The patient also inquires about incorporating vegetarian options, specifically chick patties and burgers, into her meal plan. She was previously trying a pescetarian diet and still has some of these items left over.          PHYSICAL EXAM:  Blood pressure 138/82, pulse (!) 59, temperature 98.2 F (36.8 C), height 5' 4 (1.626 m), weight 200 lb (90.7 kg), SpO2 98%. Body mass index is 34.33 kg/m.  DIAGNOSTIC DATA REVIEWED:  BMET    Component Value Date/Time   NA 141 01/10/2023 1423   NA 141 10/24/2022 1026   K 3.7 01/10/2023 1423   CL 103 01/10/2023 1423   CO2 29 01/10/2023 1423   GLUCOSE 113 (H) 01/10/2023 1423   BUN 16 01/10/2023 1423   BUN 14 10/24/2022 1026   CREATININE 0.93 01/10/2023 1423   CREATININE 0.72 02/05/2014 1620   CALCIUM  9.4 01/10/2023 1423   GFRNONAA >60 01/10/2023 1423   GFRAA >60 04/18/2019 0205   Lab Results  Component Value Date   HGBA1C 5.6 10/24/2022   HGBA1C 6.0 (H) 07/20/2020   Lab Results  Component Value Date   INSULIN  5.4 10/24/2022   INSULIN  17.8 07/20/2020   Lab Results  Component Value Date   TSH 2.630 05/30/2022   CBC    Component Value Date/Time   WBC 6.0 01/04/2022 0829   WBC 9.2 04/18/2019 0205   RBC 4.24 01/04/2022 0829   RBC 3.77 (L) 04/18/2019 0205   HGB 13.2 01/04/2022 0829  HCT 39.6 01/04/2022 0829   PLT 310 01/04/2022 0829   MCV 93 01/04/2022 0829   MCH 31.1 01/04/2022 0829   MCH 29.7 04/18/2019 0205   MCHC 33.3 01/04/2022 0829   MCHC 32.0 04/18/2019 0205   RDW 11.8 01/04/2022 0829   Iron Studies    Component Value Date/Time   FERRITIN 1,561 (H) 04/13/2019 1050   Lipid Panel     Component Value Date/Time   CHOL 160 10/24/2022 1026   TRIG 56 10/24/2022 1026   HDL 58 10/24/2022 1026   CHOLHDL 3.3 12/23/2018 1322   LDLCALC 91 10/24/2022 1026   Hepatic Function Panel     Component Value Date/Time   PROT 6.9 10/24/2022 1026   ALBUMIN 4.4 10/24/2022 1026   AST 13 10/24/2022  1026   ALT 15 10/24/2022 1026   ALKPHOS 60 10/24/2022 1026   BILITOT 1.2 10/24/2022 1026   BILIDIR 0.18 02/20/2018 0914      Component Value Date/Time   TSH 2.630 05/30/2022 0755   Nutritional Lab Results  Component Value Date   VD25OH 59.8 10/24/2022   VD25OH 58.9 05/30/2022   VD25OH 55.9 01/04/2022     Assessment and Plan    Obesity Gained one pound since the last visit over a month ago, during the holidays. Following a combination of category two and low carb diet about 75% of the time. Engaging in physical activities such as walking and biking for about 30 minutes, three times per week. Working on decluttering the kitchen to better focus on diet. Making conscious efforts to increase protein intake. Discussed the importance of resetting after the holidays and continuing with meal planning and home cooking. Prefers category two diet but likes the flexibility of low carb for protein intake. Agreed to continue with category two diet and add more protein or vegetables as needed. - Continue with category two diet and low carb diet - Add more protein and vegetables as needed - Encourage meal planning and home cooking - Continue walking and biking for 30 minutes, three times per week  Vitamin D  Deficiency Currently on prescription vitamin D  50,000 IU weekly. Last vitamin D  level checked in August. Plan to repeat labs in February to reassess vitamin D  levels. - Refill prescription for vitamin D  50,000 IU weekly - Order repeat labs in February to check vitamin D  levels   Follow-up - Schedule follow-up appointment on February 5th at 8:20 AM for fasting labs - Schedule follow-up appointment on March 5th at 10:40 AM for regular visit.         I have personally spent 25 minutes total time today in preparation, patient care, and documentation for this visit, including the following: review of clinical lab tests; review of medical tests/procedures/services.    She was informed of the  importance of frequent follow up visits to maximize her success with intensive lifestyle modifications for her multiple health conditions.    Louann Penton, MD

## 2023-03-27 ENCOUNTER — Other Ambulatory Visit: Payer: Self-pay | Admitting: Internal Medicine

## 2023-03-27 DIAGNOSIS — E7849 Other hyperlipidemia: Secondary | ICD-10-CM

## 2023-04-24 ENCOUNTER — Ambulatory Visit (INDEPENDENT_AMBULATORY_CARE_PROVIDER_SITE_OTHER): Payer: Commercial Managed Care - HMO | Admitting: Family Medicine

## 2023-04-24 ENCOUNTER — Encounter (INDEPENDENT_AMBULATORY_CARE_PROVIDER_SITE_OTHER): Payer: Self-pay | Admitting: Family Medicine

## 2023-04-24 VITALS — BP 136/74 | HR 55 | Temp 97.9°F | Ht 64.0 in | Wt 202.0 lb

## 2023-04-24 DIAGNOSIS — E559 Vitamin D deficiency, unspecified: Secondary | ICD-10-CM | POA: Diagnosis not present

## 2023-04-24 DIAGNOSIS — R7303 Prediabetes: Secondary | ICD-10-CM

## 2023-04-24 DIAGNOSIS — Z6834 Body mass index (BMI) 34.0-34.9, adult: Secondary | ICD-10-CM

## 2023-04-24 DIAGNOSIS — E785 Hyperlipidemia, unspecified: Secondary | ICD-10-CM | POA: Diagnosis not present

## 2023-04-24 DIAGNOSIS — E7849 Other hyperlipidemia: Secondary | ICD-10-CM

## 2023-04-24 DIAGNOSIS — E669 Obesity, unspecified: Secondary | ICD-10-CM

## 2023-04-24 DIAGNOSIS — F5089 Other specified eating disorder: Secondary | ICD-10-CM

## 2023-04-24 DIAGNOSIS — F3289 Other specified depressive episodes: Secondary | ICD-10-CM

## 2023-04-24 MED ORDER — BUPROPION HCL ER (SR) 150 MG PO TB12: 150.0000 mg | ORAL_TABLET | Freq: Every morning | ORAL | 0 refills | Status: DC

## 2023-04-24 MED ORDER — VITAMIN D (ERGOCALCIFEROL) 1.25 MG (50000 UNIT) PO CAPS: 50000.0000 [IU] | ORAL_CAPSULE | ORAL | 0 refills | Status: DC

## 2023-04-24 NOTE — Progress Notes (Signed)
 .smr  Office: [phone]  /  Fax: [phone]  WEIGHT SUMMARY AND BIOMETRICS  Anthropometric Measurements Height: 5' 4 (1.626 m) Weight: 202 lb (91.6 kg) BMI (Calculated): 34.66 Weight at Last Visit: 200 lb Weight Lost Since Last Visit: 0 Weight Gained Since Last Visit: 2 lb Starting Weight: 194 lb Total Weight Loss (lbs): 26 lb (11.8 kg)   Body Composition  Body Fat %: 46.1 % Fat Mass (lbs): 93.2 lbs Muscle Mass (lbs): 103.4 lbs Total Body Water (lbs): 76.6 lbs Visceral Fat Rating : 13   Other Clinical Data Fasting: Yes Labs: Yes Today's Visit #: 18 Starting Date: 07/20/20    Chief Complaint: OBESITY   Discussed the use of AI scribe software for clinical note transcription with the patient, who gave verbal consent to proceed.  History of Present Illness   [name] is a 63 year old female who presents to discuss her weight management.  She has gained two pounds over the last month and adheres to her category two eating plan 75% of the time. She engages in physical activity, including walking and using an exercise bike, three times per week. She feels overwhelmed and struggles to maintain her weight management efforts.  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 She spends a lot of time at home with her husband and sometimes eats out of stress or boredom while watching TV. She acknowledges not eating the right amounts of food, which affects her metabolism. She feels hard on herself when comparing her current state to where she was before. She recalls doing well with her weight management for the first week or two but then losing momentum. She recognizes that downtime, increased stress, and lack of routine make it harder to maintain her weight management efforts. She is interested in exploring options to help with emotional eating behaviors.  She is currently not on any  medication for emotional eating but is open to considering options that might help her manage her eating behaviors and stress.  She has a history of kidney stones. No significant insomnia or history of seizures.          PHYSICAL EXAM:  Blood pressure 136/74, pulse (!) 55, temperature 97.9 F (36.6 C), height 5' 4 (1.626 m), weight 202 lb (91.6 kg), SpO2 96%. Body mass index is 34.67 kg/m.  DIAGNOSTIC DATA REVIEWED:  BMET    Component Value Date/Time   NA 141 01/10/2023 1423   NA 141 10/24/2022 1026   K 3.7 01/10/2023 1423   CL 103 01/10/2023 1423   CO2 29 01/10/2023 1423   GLUCOSE 113 (H) 01/10/2023 1423   BUN 16 01/10/2023 1423   BUN 14 10/24/2022 1026   CREATININE 0.93 01/10/2023 1423   CREATININE 0.72 02/05/2014 1620   CALCIUM  9.4 01/10/2023 1423   GFRNONAA >60 01/10/2023 1423   GFRAA >60 04/18/2019 0205   Lab Results  Component Value Date   HGBA1C 5.6 10/24/2022   HGBA1C 6.0 (H) 07/20/2020   Lab Results  Component Value Date   INSULIN  5.4 10/24/2022   INSULIN  17.8 07/20/2020   Lab Results  Component Value Date   TSH 2.630 05/30/2022   CBC    Component Value Date/Time   WBC 6.0 01/04/2022 0829   WBC 9.2 04/18/2019 0205   RBC 4.24 01/04/2022 0829   RBC 3.77 (L) 04/18/2019 0205   HGB 13.2 01/04/2022 0829  HCT 39.6 01/04/2022 0829   PLT 310 01/04/2022 0829   MCV 93 01/04/2022 0829   MCH 31.1 01/04/2022 0829   MCH 29.7 04/18/2019 0205   MCHC 33.3 01/04/2022 0829   MCHC 32.0 04/18/2019 0205   RDW 11.8 01/04/2022 0829   Iron Studies    Component Value Date/Time   FERRITIN 1,561 (H) 04/13/2019 1050   Lipid Panel     Component Value Date/Time   CHOL 160 10/24/2022 1026   TRIG 56 10/24/2022 1026   HDL 58 10/24/2022 1026   CHOLHDL 3.3 12/23/2018 1322   LDLCALC 91 10/24/2022 1026   Hepatic Function Panel     Component Value Date/Time   PROT 6.9 10/24/2022 1026   ALBUMIN 4.4 10/24/2022 1026   AST 13 10/24/2022 1026   ALT 15 10/24/2022 1026    ALKPHOS 60 10/24/2022 1026   BILITOT 1.2 10/24/2022 1026   BILIDIR 0.18 02/20/2018 0914      Component Value Date/Time   TSH 2.630 05/30/2022 0755   Nutritional Lab Results  Component Value Date   VD25OH 59.8 10/24/2022   VD25OH 58.9 05/30/2022   VD25OH 55.9 01/04/2022     Assessment and Plan    Obesity Reports weight gain of 2 lbs over the last month. Following category two eating plan 75% of the time and engaging in physical activity three times per week. Feels overwhelmed and struggles to maintain weight loss efforts. Stress level 7-8/10. Discussed importance of routine and psychological aspects of weight loss. Considered pharmacotherapy for emotional eating behaviors. Discussed medication options, including benefits for weight loss, emotional eating control, and mild antidepressant effects. Explained potential side effects: dry mouth, increased blood pressure, and productive energy. Emphasized morning dosing to avoid sleep disturbances. - Prescribe medication for emotional eating behaviors. Start with a standard dose, with potential adjustments based on response. - Follow-up in four weeks to assess medication efficacy and adjust dosage if necessary. - Encourage adherence to prescribed medications and maintaining a positive routine. - Provide a copy of the category two eating plan for reference.  Emotional Eating Behavior Exacerbated by stress and lack of routine. Discussed medication to manage behaviors and improve emotional well-being. Explained medication's role in reducing cravings and improving productivity. - Prescribe medication for emotional eating behaviors. - Monitor for side effects such as dry mouth and increased blood pressure. - Encourage morning dosing to avoid sleep disturbances.  Prediabetes Previous hemoglobin A1c of 5.6. Discussed importance of diet and exercise in managing blood sugar levels. - Check hemoglobin A1c and insulin  levels. - Encourage continued  diet and exercise efforts.  Hyperlipidemia Last cholesterol levels were good. - Check cholesterol levels.  Vitamin D  Deficiency Plan to check vitamin D  levels and refill vitamin D  prescription. - Check vitamin D  levels. - Refill vitamin D  prescription.  General Health Maintenance Discussed importance of maintaining a healthy diet, regular exercise, and routine health screenings. - Check CMP to assess kidneys, liver, and electrolytes. - Encourage outdoor activities as weather improves.  Follow-up - Follow-up in four weeks to assess medication efficacy and adjust dosage if necessary. - Schedule next follow-up four weeks after March 5th appointment.         I have personally spent 40 minutes total time today in preparation, patient care, and documentation for this visit, including the following: review of clinical lab tests; review of medical tests/procedures/services.    She was informed of the importance of frequent follow up visits to maximize her success with intensive lifestyle modifications for her  multiple health conditions.    Louann Penton, MD

## 2023-04-25 LAB — CMP14+EGFR
ALT: 12 [IU]/L (ref 0–32)
AST: 13 [IU]/L (ref 0–40)
Albumin: 4.1 g/dL (ref 3.9–4.9)
Alkaline Phosphatase: 60 [IU]/L (ref 44–121)
BUN/Creatinine Ratio: 20 (ref 12–28)
BUN: 16 mg/dL (ref 8–27)
Bilirubin Total: 0.7 mg/dL (ref 0.0–1.2)
CO2: 23 mmol/L (ref 20–29)
Calcium: 9.2 mg/dL (ref 8.7–10.3)
Chloride: 106 mmol/L (ref 96–106)
Creatinine, Ser: 0.81 mg/dL (ref 0.57–1.00)
Globulin, Total: 2.6 g/dL (ref 1.5–4.5)
Glucose: 99 mg/dL (ref 70–99)
Potassium: 4.3 mmol/L (ref 3.5–5.2)
Sodium: 144 mmol/L (ref 134–144)
Total Protein: 6.7 g/dL (ref 6.0–8.5)
eGFR: 82 mL/min/{1.73_m2} (ref >59)

## 2023-04-25 LAB — HEMOGLOBIN A1C
Est. average glucose Bld gHb Est-mCnc: 117 mg/dL
Hgb A1c MFr Bld: 5.7 % — ABNORMAL HIGH (ref 4.8–5.6)

## 2023-04-25 LAB — LIPID PANEL WITH LDL/HDL RATIO
Cholesterol, Total: 170 mg/dL (ref 100–199)
HDL: 54 mg/dL (ref >39)
LDL Chol Calc (NIH): 106 mg/dL — ABNORMAL HIGH (ref 0–99)
LDL/HDL Ratio: 2 {ratio} (ref 0.0–3.2)
Triglycerides: 50 mg/dL (ref 0–149)
VLDL Cholesterol Cal: 10 mg/dL (ref 5–40)

## 2023-04-25 LAB — VITAMIN D 25 HYDROXY (VIT D DEFICIENCY, FRACTURES): Vit D, 25-Hydroxy: 57.7 ng/mL (ref 30.0–100.0)

## 2023-04-25 LAB — VITAMIN B12: Vitamin B-12: 674 pg/mL (ref 232–1245)

## 2023-04-25 LAB — INSULIN, RANDOM: INSULIN: 4.9 u[IU]/mL (ref 2.6–24.9)

## 2023-05-21 ENCOUNTER — Other Ambulatory Visit: Payer: Self-pay | Admitting: Cardiology

## 2023-05-22 ENCOUNTER — Encounter (INDEPENDENT_AMBULATORY_CARE_PROVIDER_SITE_OTHER): Payer: Self-pay | Admitting: Family Medicine

## 2023-05-22 ENCOUNTER — Ambulatory Visit (INDEPENDENT_AMBULATORY_CARE_PROVIDER_SITE_OTHER): Payer: Commercial Managed Care - HMO | Admitting: Family Medicine

## 2023-05-22 VITALS — BP 123/73 | HR 60 | Temp 98.4°F | Ht 64.0 in | Wt 195.0 lb

## 2023-05-22 DIAGNOSIS — E785 Hyperlipidemia, unspecified: Secondary | ICD-10-CM

## 2023-05-22 DIAGNOSIS — Z6833 Body mass index (BMI) 33.0-33.9, adult: Secondary | ICD-10-CM

## 2023-05-22 DIAGNOSIS — E669 Obesity, unspecified: Secondary | ICD-10-CM

## 2023-05-22 DIAGNOSIS — F3289 Other specified depressive episodes: Secondary | ICD-10-CM

## 2023-05-22 DIAGNOSIS — R7303 Prediabetes: Secondary | ICD-10-CM

## 2023-05-22 DIAGNOSIS — E559 Vitamin D deficiency, unspecified: Secondary | ICD-10-CM | POA: Diagnosis not present

## 2023-05-22 MED ORDER — VITAMIN D (ERGOCALCIFEROL) 1.25 MG (50000 UNIT) PO CAPS: 50000.0000 [IU] | ORAL_CAPSULE | ORAL | 0 refills | Status: DC

## 2023-05-22 MED ORDER — BUPROPION HCL ER (SR) 150 MG PO TB12: 150.0000 mg | ORAL_TABLET | Freq: Every morning | ORAL | 0 refills | Status: DC

## 2023-05-22 NOTE — Progress Notes (Signed)
 Office: [phone]  /  Fax: [phone]  WEIGHT SUMMARY AND BIOMETRICS  Anthropometric Measurements Height: 5\' 4"  (1.626 m) Weight: 195 lb (88.5 kg) BMI (Calculated): 33.46 Weight at Last Visit: 202 lb Weight Lost Since Last Visit: 7 Weight Gained Since Last Visit: 0 Starting Weight: 228 lb Total Weight Loss (lbs): 33 lb (15 kg)   Body Composition  Body Fat %: 43.8 % Fat Mass (lbs): 85.6 lbs Muscle Mass (lbs): 104.4 lbs Total Body Water (lbs): 73 lbs Visceral Fat Rating : 12   Other Clinical Data Fasting: nono Today's Visit #: 40 Starting Date: 07/20/20    Chief Complaint: OBESITY   History of Present Illness   [name] is a 63 year old female who presents for obesity treatment plan and progress monitoring.  She has lost seven pounds in the last month. She adheres to her category two eating plan approximately 75-80% of the time and engages in cardio exercises such as walking outdoors and cycling indoors three times a week for about 30 minutes. She feels well overall, experiences mild fatigue post-exercise, but recovers quickly after resting for a few minutes.  She has a history of emotional eating behaviors and is currently on Wellbutrin SR 150 mg, which has improved her focus and awareness of her eating habits. She requests a refill for this medication and reports stability on it.  She is actively working on diet, exercise, and weight loss to improve her cholesterol levels. She takes Lipitor 40 mg daily and has been successful in managing her cholesterol through dietary changes. Recent lab results indicate a slight increase in LDL cholesterol from 91 to 106, while triglycerides and HDL cholesterol remain favorable. She uses a pill box to manage her medication intake and believes she is consistent with her doses.  She has a history of vitamin D deficiency and is on prescription vitamin D 50,000 IU per week. Her most recent vitamin D level, checked last  month, was well controlled at 57.7, and she is not currently at high risk of over-replacement.  Her glucose level has improved from 113 to 99, but her hemoglobin A1c has increased slightly from 5.6 to 5.7, reflecting her blood sugar readings over the past three months, including the holiday season. Continued monitoring is necessary.  Recent lab results show good kidney, liver, and electrolyte function. B12 levels are in the 600s, and insulin levels are excellent at 4.9.          PHYSICAL EXAM:  Blood pressure 123/73, pulse 60, temperature 98.4 F (36.9 C), height 5\' 4"  (1.626 m), weight 195 lb (88.5 kg), SpO2 100%. Body mass index is 33.47 kg/m.  DIAGNOSTIC DATA REVIEWED:  BMET    Component Value Date/Time   NA 144 04/24/2023 0909   K 4.3 04/24/2023 0909   CL 106 04/24/2023 0909   CO2 23 04/24/2023 0909   GLUCOSE 99 04/24/2023 0909   GLUCOSE 113 (H) 01/10/2023 1423   BUN 16 04/24/2023 0909   CREATININE 0.81 04/24/2023 0909   CREATININE 0.72 02/05/2014 1620   CALCIUM 9.2 04/24/2023 0909   GFRNONAA >60 01/10/2023 1423   GFRAA >60 04/18/2019 0205   Lab Results  Component Value Date   HGBA1C 5.7 (H) 04/24/2023   HGBA1C 6.0 (H) 07/20/2020   Lab Results  Component Value Date   INSULIN 4.9 04/24/2023   INSULIN 17.8 07/20/2020   Lab Results  Component Value Date   TSH 2.630 05/30/2022   CBC    Component Value Date/Time  WBC 6.0 01/04/2022 0829   WBC 9.2 04/18/2019 0205   RBC 4.24 01/04/2022 0829   RBC 3.77 (L) 04/18/2019 0205   HGB 13.2 01/04/2022 0829   HCT 39.6 01/04/2022 0829   PLT 310 01/04/2022 0829   MCV 93 01/04/2022 0829   MCH 31.1 01/04/2022 0829   MCH 29.7 04/18/2019 0205   MCHC 33.3 01/04/2022 0829   MCHC 32.0 04/18/2019 0205   RDW 11.8 01/04/2022 0829   Iron Studies    Component Value Date/Time   FERRITIN 1,561 (H) 04/13/2019 1050   Lipid Panel     Component Value Date/Time   CHOL 170 04/24/2023 0909   TRIG 50 04/24/2023 0909   HDL 54  04/24/2023 0909   CHOLHDL 3.3 12/23/2018 1322   LDLCALC 106 (H) 04/24/2023 0909   Hepatic Function Panel     Component Value Date/Time   PROT 6.7 04/24/2023 0909   ALBUMIN 4.1 04/24/2023 0909   AST 13 04/24/2023 0909   ALT 12 04/24/2023 0909   ALKPHOS 60 04/24/2023 0909   BILITOT 0.7 04/24/2023 0909   BILIDIR 0.18 02/20/2018 0914      Component Value Date/Time   TSH 2.630 05/30/2022 0755   Nutritional Lab Results  Component Value Date   VD25OH 57.7 04/24/2023   VD25OH 59.8 10/24/2022   VD25OH 58.9 05/30/2022     Assessment and Plan    Obesity She has achieved a seven-pound weight loss in the last month through adherence to a category two eating plan 75-80% of the time and exercising three times a week, primarily cardio walking and cycling. Wellbutrin SR 150 mg is aiding in managing emotional eating behaviors, enhancing her focus on herself and her eating habits. - Continue category two eating plan - Continue exercise regimen of walking and cycling - Refill Wellbutrin SR 150 mg for emotional eating behaviors - Follow up in four weeks  Pre-diabetes Glucose level improved from 113 to 99, but hemoglobin A1c increased slightly from 5.6 to 5.7, indicating a minor rise in average blood sugar levels over the past three months. Current management will continue as this is not a significant concern. - Continue current management plan and monitor blood sugar levels  Hyperlipidemia LDL cholesterol increased slightly from 91 to 106, but triglycerides remain excellent. She is on Lipitor 40 mg daily and is focusing on diet, exercise, and weight loss to improve cholesterol levels. Medication adherence is supported by a pill tray. - Continue Lipitor 40 mg daily - Continue working on diet, exercise, and weight loss to manage cholesterol  Vitamin D deficiency Vitamin D level is well-controlled at 57.7 with prescription vitamin D 50,000 IU per week, indicating effective management without  risk of over-replacement. - Continue prescription vitamin D 50,000 IU per week  Follow-up She has a follow-up appointment scheduled for April 2nd and needs to schedule a May appointment. - Confirm follow-up appointment on April 2nd - Schedule follow-up appointment for May       She was informed of the importance of frequent follow up visits to maximize her success with intensive lifestyle modifications for her multiple health conditions.    Quillian Quince, MD

## 2023-06-16 ENCOUNTER — Other Ambulatory Visit: Payer: Self-pay | Admitting: Cardiology

## 2023-06-16 ENCOUNTER — Other Ambulatory Visit: Payer: Self-pay | Admitting: Internal Medicine

## 2023-06-16 DIAGNOSIS — I493 Ventricular premature depolarization: Secondary | ICD-10-CM

## 2023-06-19 ENCOUNTER — Ambulatory Visit (INDEPENDENT_AMBULATORY_CARE_PROVIDER_SITE_OTHER): Payer: Commercial Managed Care - HMO | Admitting: Family Medicine

## 2023-06-19 ENCOUNTER — Encounter (INDEPENDENT_AMBULATORY_CARE_PROVIDER_SITE_OTHER): Payer: Self-pay | Admitting: Family Medicine

## 2023-06-19 VITALS — BP 129/68 | HR 51 | Temp 98.0°F | Ht 64.0 in | Wt 196.0 lb

## 2023-06-19 DIAGNOSIS — R7303 Prediabetes: Secondary | ICD-10-CM | POA: Diagnosis not present

## 2023-06-19 DIAGNOSIS — F5089 Other specified eating disorder: Secondary | ICD-10-CM | POA: Diagnosis not present

## 2023-06-19 DIAGNOSIS — E559 Vitamin D deficiency, unspecified: Secondary | ICD-10-CM | POA: Diagnosis not present

## 2023-06-19 DIAGNOSIS — F3289 Other specified depressive episodes: Secondary | ICD-10-CM

## 2023-06-19 DIAGNOSIS — E669 Obesity, unspecified: Secondary | ICD-10-CM

## 2023-06-19 DIAGNOSIS — Z6833 Body mass index (BMI) 33.0-33.9, adult: Secondary | ICD-10-CM

## 2023-06-19 DIAGNOSIS — I10 Essential (primary) hypertension: Secondary | ICD-10-CM | POA: Diagnosis not present

## 2023-06-19 MED ORDER — BUPROPION HCL ER (SR) 150 MG PO TB12: 150.0000 mg | ORAL_TABLET | Freq: Every morning | ORAL | 0 refills | Status: DC

## 2023-06-19 MED ORDER — METFORMIN HCL 500 MG PO TABS: 500.0000 mg | ORAL_TABLET | Freq: Every day | ORAL | 0 refills | Status: DC

## 2023-06-19 MED ORDER — VITAMIN D (ERGOCALCIFEROL) 1.25 MG (50000 UNIT) PO CAPS: 50000.0000 [IU] | ORAL_CAPSULE | ORAL | 0 refills | Status: DC

## 2023-06-19 NOTE — Progress Notes (Signed)
 Office: [phone]  /  Fax: [phone]  WEIGHT SUMMARY AND BIOMETRICS  Anthropometric Measurements Height: 5\' 4"  (1.626 m) Weight: 196 lb (88.9 kg) BMI (Calculated): 33.63 Weight at Last Visit: 195 lb Weight Lost Since Last Visit: 0 Weight Gained Since Last Visit: 1 lb Starting Weight: 228 lb Total Weight Loss (lbs): 32 lb (14.5 kg)   Body Composition  Body Fat %: 43.8 % Fat Mass (lbs): 86 lbs Muscle Mass (lbs): 104.6 lbs Total Body Water (lbs): 73.4 lbs Visceral Fat Rating : 12   Other Clinical Data Fasting: No Labs: No Today's Visit #: 41 Starting Date: 07/20/20    Chief Complaint: OBESITY    History of Present Illness [name] is a 63 year old female with obesity and prediabetes who presents for obesity treatment plan assessment and progress evaluation.  It has been four weeks since her last visit. She follows a combination of journaling and a category two eating plan about seventy-five percent of the time. She exercises by walking and using her exercise bike for thirty minutes three times a day. Despite these efforts, she has gained one pound in the last month but has lost a total of thirty-two pounds since starting the program.  She has a history of prediabetes, which she is managing with diet and exercise. She is not currently on medications for this condition. She experiences polyphagia associated with her prediabetes.  She is currently taking vitamin D and Wellbutrin without any issues. Her blood pressure is managed with chlorthalidone and metoprolol.  She mentions that she does not use table salt but uses seasoning salt occasionally and has questions about different seasonings.      PHYSICAL EXAM:  Blood pressure 129/68, pulse (!) 51, temperature 98 F (36.7 C), height 5\' 4"  (1.626 m), weight 196 lb (88.9 kg), SpO2 97%. Body mass index is 33.64 kg/m.  DIAGNOSTIC DATA REVIEWED:  BMET    Component Value Date/Time   NA 144  04/24/2023 0909   K 4.3 04/24/2023 0909   CL 106 04/24/2023 0909   CO2 23 04/24/2023 0909   GLUCOSE 99 04/24/2023 0909   GLUCOSE 113 (H) 01/10/2023 1423   BUN 16 04/24/2023 0909   CREATININE 0.81 04/24/2023 0909   CREATININE 0.72 02/05/2014 1620   CALCIUM 9.2 04/24/2023 0909   GFRNONAA >60 01/10/2023 1423   GFRAA >60 04/18/2019 0205   Lab Results  Component Value Date   HGBA1C 5.7 (H) 04/24/2023   HGBA1C 6.0 (H) 07/20/2020   Lab Results  Component Value Date   INSULIN 4.9 04/24/2023   INSULIN 17.8 07/20/2020   Lab Results  Component Value Date   TSH 2.630 05/30/2022   CBC    Component Value Date/Time   WBC 6.0 01/04/2022 0829   WBC 9.2 04/18/2019 0205   RBC 4.24 01/04/2022 0829   RBC 3.77 (L) 04/18/2019 0205   HGB 13.2 01/04/2022 0829   HCT 39.6 01/04/2022 0829   PLT 310 01/04/2022 0829   MCV 93 01/04/2022 0829   MCH 31.1 01/04/2022 0829   MCH 29.7 04/18/2019 0205   MCHC 33.3 01/04/2022 0829   MCHC 32.0 04/18/2019 0205   RDW 11.8 01/04/2022 0829   Iron Studies    Component Value Date/Time   FERRITIN 1,561 (H) 04/13/2019 1050   Lipid Panel     Component Value Date/Time   CHOL 170 04/24/2023 0909   TRIG 50 04/24/2023 0909   HDL 54 04/24/2023 0909   CHOLHDL 3.3 12/23/2018 1322  LDLCALC 106 (H) 04/24/2023 0909   Hepatic Function Panel     Component Value Date/Time   PROT 6.7 04/24/2023 0909   ALBUMIN 4.1 04/24/2023 0909   AST 13 04/24/2023 0909   ALT 12 04/24/2023 0909   ALKPHOS 60 04/24/2023 0909   BILITOT 0.7 04/24/2023 0909   BILIDIR 0.18 02/20/2018 0914      Component Value Date/Time   TSH 2.630 05/30/2022 0755   Nutritional Lab Results  Component Value Date   VD25OH 57.7 04/24/2023   VD25OH 59.8 10/24/2022   VD25OH 58.9 05/30/2022     Assessment and Plan Assessment & Plan Obesity   She follows a combination of journaling and a category two eating plan about 75% of the time. She exercises by walking and using her exercise bike for  30 minutes three times a day. Despite these efforts, she gained one pound in the last month but has lost a total of 32 pounds since starting the program. Metformin was discussed as a potential aid in weight loss and managing prediabetes, as it can help reduce fat storage and hunger cravings. It is considered one of the safest drugs available, with the main side effect being queasiness in about 20% of people if taken on an empty stomach. It is inexpensive and can be stopped without adverse effects, though the benefits will cease.   - Prescribe low-dose metformin and send prescription to [facility].   - Continue current diet and exercise regimen.   - Provide handout on metformin side effects.    Prediabetes   She manages prediabetes with diet and exercise. Metformin is beneficial for prediabetes as it reduces the likelihood of progression to diabetes and aids in weight management by reducing fat storage and hunger cravings. The medication accounts for 20% of the treatment, with diet being 80%.   - Prescribe low-dose metformin and send prescription to [facility].   - Continue current diet and exercise regimen.    Emotional eating behaviors   She experiences polyphagia associated with prediabetes. Challenges include accurately tracking caloric intake and the potential impact of restaurant meals and portion estimation on weight management. Carbohydrate intake is not a primary concern if calorie and protein goals are met.   - Continue to work on diet and exercise to manage emotional eating behaviors.   - Encourage accurate tracking of food intake and portion sizes.   -Refill Wellbutrin  Hypertension   Her blood pressure is well-controlled at 129/68 mmHg. She is on chlorthalidone and metoprolol for hypertension. As she continues to lose weight, there may be a need to reduce her antihypertensive medications.   - Continue current antihypertensive medications (chlorthalidone and  metoprolol).   - Monitor blood pressure and adjust medications as needed with continued weight loss.    Vitamin D deficiency   She is currently taking vitamin D RX and reports no issues with them.  Stable. -RF vitamin D   She was informed of the importance of frequent follow up visits to maximize her success with intensive lifestyle modifications for her multiple health conditions.    Quillian Quince, MD

## 2023-07-18 ENCOUNTER — Encounter (INDEPENDENT_AMBULATORY_CARE_PROVIDER_SITE_OTHER): Payer: Self-pay | Admitting: Family Medicine

## 2023-07-18 ENCOUNTER — Ambulatory Visit (INDEPENDENT_AMBULATORY_CARE_PROVIDER_SITE_OTHER): Admitting: Family Medicine

## 2023-07-18 VITALS — BP 115/70 | HR 51 | Temp 97.8°F | Ht 64.0 in | Wt 193.0 lb

## 2023-07-18 DIAGNOSIS — F5089 Other specified eating disorder: Secondary | ICD-10-CM

## 2023-07-18 DIAGNOSIS — F3289 Other specified depressive episodes: Secondary | ICD-10-CM

## 2023-07-18 DIAGNOSIS — R7303 Prediabetes: Secondary | ICD-10-CM

## 2023-07-18 DIAGNOSIS — E669 Obesity, unspecified: Secondary | ICD-10-CM

## 2023-07-18 DIAGNOSIS — G4733 Obstructive sleep apnea (adult) (pediatric): Secondary | ICD-10-CM | POA: Diagnosis not present

## 2023-07-18 DIAGNOSIS — E559 Vitamin D deficiency, unspecified: Secondary | ICD-10-CM

## 2023-07-18 DIAGNOSIS — Z6833 Body mass index (BMI) 33.0-33.9, adult: Secondary | ICD-10-CM

## 2023-07-18 MED ORDER — BUPROPION HCL ER (SR) 200 MG PO TB12: 200.0000 mg | ORAL_TABLET | Freq: Every morning | ORAL | 0 refills | Status: DC

## 2023-07-18 MED ORDER — METFORMIN HCL 500 MG PO TABS
500.0000 mg | ORAL_TABLET | Freq: Every day | ORAL | 0 refills | Status: DC
Start: 2023-07-18 — End: 2023-08-19

## 2023-07-18 MED ORDER — VITAMIN D (ERGOCALCIFEROL) 1.25 MG (50000 UNIT) PO CAPS: 50000.0000 [IU] | ORAL_CAPSULE | ORAL | 0 refills | Status: DC

## 2023-07-18 NOTE — Progress Notes (Signed)
 Office: [phone]  /  Fax: [phone]  WEIGHT SUMMARY AND BIOMETRICS  Anthropometric Measurements Height: 5\' 4"  (1.626 m) Weight: 193 lb (87.5 kg) BMI (Calculated): 33.11 Weight at Last Visit: 196lb Weight Lost Since Last Visit: 3lb Weight Gained Since Last Visit: 0 Starting Weight: 228lb Total Weight Loss (lbs): 35 lb (15.9 kg)   Body Composition  Body Fat %: 44.1 % Fat Mass (lbs): 85.4 lbs Muscle Mass (lbs): 103 lbs Total Body Water (lbs): 73 lbs Visceral Fat Rating : 12   Other Clinical Data Fasting: no Labs: no Today's Visit #: 42 Starting Date: 07/20/20    Chief Complaint: OBESITY   Discussed the use of AI scribe software for clinical note transcription with the patient, who gave verbal consent to proceed.  History of Present Illness [name] is a 63 year old female who presents for treatment plan assessment and progress evaluation.  She follows her category two obesity treatment plan with varying adherence, ranging from 50% to 90%. She engages in walking for exercise, 30 minutes three times a week, and has lost three pounds in the last month. Emotional eating behaviors are present, and she is working on lifestyle modifications to address this.  For her prediabetes, she focuses on diet, exercise, and weight loss. She is stable on metformin  and requests a refill.  She has a history of vitamin D  deficiency, managed with prescription vitamin D , and requests a refill.  She experiences sleep disturbances and difficulty adjusting to her CPAP machine, with dry mouth despite using sugar-free cough drops. She manages to use the CPAP for about four to five hours per night, occasionally reaching six hours.  Her social history includes significant stress due to family health issues, with her sister and brother both having strokes, requiring her to travel frequently to [facility]. This has led to more 'grab and go' eating, but she tries to make healthier choices  and drinks water and diet tea to manage hunger.      PHYSICAL EXAM:  Blood pressure 115/70, pulse (!) 51, temperature 97.8 F (36.6 C), height 5\' 4"  (1.626 m), weight 193 lb (87.5 kg), SpO2 98%. Body mass index is 33.13 kg/m.  DIAGNOSTIC DATA REVIEWED:  BMET    Component Value Date/Time   NA 144 04/24/2023 0909   K 4.3 04/24/2023 0909   CL 106 04/24/2023 0909   CO2 23 04/24/2023 0909   GLUCOSE 99 04/24/2023 0909   GLUCOSE 113 (H) 01/10/2023 1423   BUN 16 04/24/2023 0909   CREATININE 0.81 04/24/2023 0909   CREATININE 0.72 02/05/2014 1620   CALCIUM  9.2 04/24/2023 0909   GFRNONAA >60 01/10/2023 1423   GFRAA >60 04/18/2019 0205   Lab Results  Component Value Date   HGBA1C 5.7 (H) 04/24/2023   HGBA1C 6.0 (H) 07/20/2020   Lab Results  Component Value Date   INSULIN  4.9 04/24/2023   INSULIN  17.8 07/20/2020   Lab Results  Component Value Date   TSH 2.630 05/30/2022   CBC    Component Value Date/Time   WBC 6.0 01/04/2022 0829   WBC 9.2 04/18/2019 0205   RBC 4.24 01/04/2022 0829   RBC 3.77 (L) 04/18/2019 0205   HGB 13.2 01/04/2022 0829   HCT 39.6 01/04/2022 0829   PLT 310 01/04/2022 0829   MCV 93 01/04/2022 0829   MCH 31.1 01/04/2022 0829   MCH 29.7 04/18/2019 0205   MCHC 33.3 01/04/2022 0829   MCHC 32.0 04/18/2019 0205   RDW 11.8 01/04/2022 0829  Iron Studies    Component Value Date/Time   FERRITIN 1,561 (H) 04/13/2019 1050   Lipid Panel     Component Value Date/Time   CHOL 170 04/24/2023 0909   TRIG 50 04/24/2023 0909   HDL 54 04/24/2023 0909   CHOLHDL 3.3 12/23/2018 1322   LDLCALC 106 (H) 04/24/2023 0909   Hepatic Function Panel     Component Value Date/Time   PROT 6.7 04/24/2023 0909   ALBUMIN 4.1 04/24/2023 0909   AST 13 04/24/2023 0909   ALT 12 04/24/2023 0909   ALKPHOS 60 04/24/2023 0909   BILITOT 0.7 04/24/2023 0909   BILIDIR 0.18 02/20/2018 0914      Component Value Date/Time   TSH 2.630 05/30/2022 0755   Nutritional Lab  Results  Component Value Date   VD25OH 57.7 04/24/2023   VD25OH 59.8 10/24/2022   VD25OH 58.9 05/30/2022     Assessment and Plan Assessment & Plan Obesity Obesity management is ongoing with a category two plan. Adherence varies between 50% to 90%. Engaging in physical activity by walking 30 minutes three times a week. Weight loss of 3 pounds in the last month. Emotional eating behaviors are being addressed with lifestyle modifications and Wellbutrin . She has developed strategies to manage eating during stressful times, such as choosing lower-calorie options and snacking on nuts and cheese. - Continue category two obesity management plan - Encourage adherence to exercise regimen - Continue lifestyle modifications for emotional eating  Prediabetes Prediabetes is managed with diet, exercise, and weight loss. She is stable on metformin  and actively working on dietary modifications and exercise to improve her condition. 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 She reports initial improvement but feels current dose is less effective. Discussed increasing dose to enhance benefits without additional side effects. Plan to increase Wellbutrin  SR to 200 mg daily to potentially improve efficacy. - Increase Wellbutrin  SR to 200 mg daily  Obstructive Sleep Apnea Obstructive sleep apnea management with CPAP is challenging due to dry mouth. CPAP usage is approximately 4-5 hours per night, meeting cardiologist's recommendation. She uses sugar-free cough drops to alleviate dry mouth symptoms. - Continue CPAP therapy - Use sugar-free cough drops for dry mouth  Vitamin D  Deficiency Vitamin D  deficiency is well-managed with prescription vitamin D . - Continue prescription vitamin D    She was informed of the importance of frequent follow up visits to maximize her success with intensive lifestyle  modifications for her multiple health conditions.    Jasmine Mesi, MD

## 2023-08-19 ENCOUNTER — Encounter (INDEPENDENT_AMBULATORY_CARE_PROVIDER_SITE_OTHER): Payer: Self-pay | Admitting: Family Medicine

## 2023-08-19 ENCOUNTER — Ambulatory Visit (INDEPENDENT_AMBULATORY_CARE_PROVIDER_SITE_OTHER): Admitting: Family Medicine

## 2023-08-19 VITALS — BP 130/73 | HR 58 | Temp 98.1°F | Ht 64.0 in | Wt 193.0 lb

## 2023-08-19 DIAGNOSIS — Z6833 Body mass index (BMI) 33.0-33.9, adult: Secondary | ICD-10-CM

## 2023-08-19 DIAGNOSIS — F5089 Other specified eating disorder: Secondary | ICD-10-CM

## 2023-08-19 DIAGNOSIS — R7303 Prediabetes: Secondary | ICD-10-CM

## 2023-08-19 DIAGNOSIS — E785 Hyperlipidemia, unspecified: Secondary | ICD-10-CM

## 2023-08-19 DIAGNOSIS — E7849 Other hyperlipidemia: Secondary | ICD-10-CM

## 2023-08-19 DIAGNOSIS — E66812 Obesity, class 2: Secondary | ICD-10-CM

## 2023-08-19 DIAGNOSIS — E559 Vitamin D deficiency, unspecified: Secondary | ICD-10-CM

## 2023-08-19 DIAGNOSIS — E669 Obesity, unspecified: Secondary | ICD-10-CM

## 2023-08-19 DIAGNOSIS — F3289 Other specified depressive episodes: Secondary | ICD-10-CM

## 2023-08-19 MED ORDER — BUPROPION HCL ER (SR) 200 MG PO TB12: 200.0000 mg | ORAL_TABLET | Freq: Every morning | ORAL | 0 refills | Status: DC

## 2023-08-19 MED ORDER — METFORMIN HCL 500 MG PO TABS
500.0000 mg | ORAL_TABLET | Freq: Every day | ORAL | 0 refills | Status: DC
Start: 2023-08-19 — End: 2023-09-17

## 2023-08-19 MED ORDER — VITAMIN D (ERGOCALCIFEROL) 1.25 MG (50000 UNIT) PO CAPS: 50000.0000 [IU] | ORAL_CAPSULE | ORAL | 0 refills | Status: DC

## 2023-08-19 NOTE — Progress Notes (Signed)
 Office: [phone]  /  Fax: [phone]  WEIGHT SUMMARY AND BIOMETRICS  Anthropometric Measurements Height: 5\' 4"  (1.626 m) Weight: 193 lb (87.5 kg) BMI (Calculated): 33.11 Weight at Last Visit: 193 lb Weight Lost Since Last Visit: 0 Weight Gained Since Last Visit: 0 Starting Weight: 228 lb Total Weight Loss (lbs): 35 lb (15.9 kg) Peak Weight: 234 lb   Body Composition  Body Fat %: 45 % Fat Mass (lbs): 87 lbs Muscle Mass (lbs): 100.8 lbs Total Body Water (lbs): 74.6 lbs Visceral Fat Rating : 13   Other Clinical Data Fasting: yes Labs: no Today's Visit #: 43 Starting Date: 07/20/20    Chief Complaint: OBESITY   History of Present Illness [name] is a 63 year old female who presents for obesity treatment plan assessment and progress evaluation.  She is adhering to a category two prescribed eating plan approximately 70% of the time. She faces challenges with meal planning due to a busy schedule, often opting for quick meals instead of planned ones. Her exercise routine includes using a stationary bike and walking for 20 to 30 minutes, three times a week. She has maintained her weight over the last month since her last visit.  She has a history of vitamin D  deficiency and is currently on prescription vitamin D . Additionally, she has prediabetes and is on metformin , alongside diet and exercise, to manage this condition.  She has a history of emotional eating behavior, which has been present since being treated with Wellbutrin  SR 200 mg. Her hunger is mostly controlled and not excessive.      PHYSICAL EXAM:  Blood pressure 130/73, pulse (!) 58, temperature 98.1 F (36.7 C), height 5\' 4"  (1.626 m), weight 193 lb (87.5 kg), SpO2 99%. Body mass index is 33.13 kg/m.  DIAGNOSTIC DATA REVIEWED:  BMET    Component Value Date/Time   NA 144 04/24/2023 0909   K 4.3 04/24/2023 0909   CL 106 04/24/2023 0909   CO2 23 04/24/2023 0909   GLUCOSE 99  04/24/2023 0909   GLUCOSE 113 (H) 01/10/2023 1423   BUN 16 04/24/2023 0909   CREATININE 0.81 04/24/2023 0909   CREATININE 0.72 02/05/2014 1620   CALCIUM  9.2 04/24/2023 0909   GFRNONAA >60 01/10/2023 1423   GFRAA >60 04/18/2019 0205   Lab Results  Component Value Date   HGBA1C 5.7 (H) 04/24/2023   HGBA1C 6.0 (H) 07/20/2020   Lab Results  Component Value Date   INSULIN  4.9 04/24/2023   INSULIN  17.8 07/20/2020   Lab Results  Component Value Date   TSH 2.630 05/30/2022   CBC    Component Value Date/Time   WBC 6.0 01/04/2022 0829   WBC 9.2 04/18/2019 0205   RBC 4.24 01/04/2022 0829   RBC 3.77 (L) 04/18/2019 0205   HGB 13.2 01/04/2022 0829   HCT 39.6 01/04/2022 0829   PLT 310 01/04/2022 0829   MCV 93 01/04/2022 0829   MCH 31.1 01/04/2022 0829   MCH 29.7 04/18/2019 0205   MCHC 33.3 01/04/2022 0829   MCHC 32.0 04/18/2019 0205   RDW 11.8 01/04/2022 0829   Iron Studies    Component Value Date/Time   FERRITIN 1,561 (H) 04/13/2019 1050   Lipid Panel     Component Value Date/Time   CHOL 170 04/24/2023 0909   TRIG 50 04/24/2023 0909   HDL 54 04/24/2023 0909   CHOLHDL 3.3 12/23/2018 1322   LDLCALC 106 (H) 04/24/2023 0909   Hepatic Function Panel  Component Value Date/Time   PROT 6.7 04/24/2023 0909   ALBUMIN 4.1 04/24/2023 0909   AST 13 04/24/2023 0909   ALT 12 04/24/2023 0909   ALKPHOS 60 04/24/2023 0909   BILITOT 0.7 04/24/2023 0909   BILIDIR 0.18 02/20/2018 0914      Component Value Date/Time   TSH 2.630 05/30/2022 0755   Nutritional Lab Results  Component Value Date   VD25OH 57.7 04/24/2023   VD25OH 59.8 10/24/2022   VD25OH 58.9 05/30/2022     Assessment and Plan Assessment & Plan Obesity Obesity management with a category two prescribed eating plan, adhered to 70% of the time. Exercise includes stationary biking and walking 20-30 minutes three times a week. Weight has been maintained over the last month. Challenges include time constraints  affecting meal planning and adherence. Discussed healthier frozen meal options as a practical alternative to ensure nutritional intake and manage calorie count. - Continue category two prescribed eating plan - Encourage exploration of healthier frozen meal options - Continue exercise regimen of stationary biking and walking three times a week  Emotional eating behaviors Emotional eating behaviors managed with Wellbutrin  SR 200 mg. No reported issues with current management. - Refill Wellbutrin  SR prescription  Prediabetes Prediabetes managed with metformin , diet, and exercise. Current management is well-controlled with no reported issues. - Refill metformin  prescription - Continue diet and exercise regimen - Order labs  Hyperlipidemia Hyperlipidemia management includes diet and exercise. Labs will be ordered to assess current status. - Order labs for hyperlipidemia assessment - Continue diet and exercise regimen  Vitamin D  deficiency Vitamin D  deficiency managed with prescription vitamin D . No reported issues with current management. - Refill prescription vitamin D  - Order labs      She was informed of the importance of frequent follow up visits to maximize her success with intensive lifestyle modifications for her multiple health conditions.    Jasmine Mesi, MD

## 2023-09-12 LAB — LIPID PANEL WITH LDL/HDL RATIO
Cholesterol, Total: 163 mg/dL (ref 100–199)
HDL: 55 mg/dL (ref >39)
LDL Chol Calc (NIH): 97 mg/dL (ref 0–99)
LDL/HDL Ratio: 1.8 ratio (ref 0.0–3.2)
Triglycerides: 51 mg/dL (ref 0–149)
VLDL Cholesterol Cal: 11 mg/dL (ref 5–40)

## 2023-09-12 LAB — INSULIN, RANDOM: INSULIN: 6.2 u[IU]/mL (ref 2.6–24.9)

## 2023-09-12 LAB — CMP14+EGFR
ALT: 14 IU/L (ref 0–32)
AST: 13 IU/L (ref 0–40)
Albumin: 4.2 g/dL (ref 3.9–4.9)
Alkaline Phosphatase: 62 IU/L (ref 44–121)
BUN/Creatinine Ratio: 15 (ref 12–28)
BUN: 14 mg/dL (ref 8–27)
Bilirubin Total: 0.9 mg/dL (ref 0.0–1.2)
CO2: 23 mmol/L (ref 20–29)
Calcium: 9.8 mg/dL (ref 8.7–10.3)
Chloride: 103 mmol/L (ref 96–106)
Creatinine, Ser: 0.92 mg/dL (ref 0.57–1.00)
Globulin, Total: 2.3 g/dL (ref 1.5–4.5)
Glucose: 97 mg/dL (ref 70–99)
Potassium: 4.3 mmol/L (ref 3.5–5.2)
Sodium: 140 mmol/L (ref 134–144)
Total Protein: 6.5 g/dL (ref 6.0–8.5)
eGFR: 70 mL/min/{1.73_m2} (ref >59)

## 2023-09-12 LAB — VITAMIN D 25 HYDROXY (VIT D DEFICIENCY, FRACTURES): Vit D, 25-Hydroxy: 80.2 ng/mL (ref 30.0–100.0)

## 2023-09-12 LAB — HEMOGLOBIN A1C
Est. average glucose Bld gHb Est-mCnc: 108 mg/dL
Hgb A1c MFr Bld: 5.4 % (ref 4.8–5.6)

## 2023-09-12 LAB — VITAMIN B12: Vitamin B-12: 551 pg/mL (ref 232–1245)

## 2023-09-17 ENCOUNTER — Ambulatory Visit (INDEPENDENT_AMBULATORY_CARE_PROVIDER_SITE_OTHER): Admitting: Family Medicine

## 2023-09-17 ENCOUNTER — Encounter (INDEPENDENT_AMBULATORY_CARE_PROVIDER_SITE_OTHER): Payer: Self-pay | Admitting: Family Medicine

## 2023-09-17 VITALS — BP 114/69 | HR 53 | Temp 98.0°F | Ht 64.0 in | Wt 191.0 lb

## 2023-09-17 DIAGNOSIS — E559 Vitamin D deficiency, unspecified: Secondary | ICD-10-CM

## 2023-09-17 DIAGNOSIS — E669 Obesity, unspecified: Secondary | ICD-10-CM

## 2023-09-17 DIAGNOSIS — R7303 Prediabetes: Secondary | ICD-10-CM

## 2023-09-17 DIAGNOSIS — F3289 Other specified depressive episodes: Secondary | ICD-10-CM

## 2023-09-17 DIAGNOSIS — Z6832 Body mass index (BMI) 32.0-32.9, adult: Secondary | ICD-10-CM

## 2023-09-17 DIAGNOSIS — F5089 Other specified eating disorder: Secondary | ICD-10-CM | POA: Diagnosis not present

## 2023-09-17 MED ORDER — BUPROPION HCL ER (SR) 200 MG PO TB12: 200.0000 mg | ORAL_TABLET | Freq: Every morning | ORAL | 0 refills | Status: DC

## 2023-09-17 MED ORDER — METFORMIN HCL 500 MG PO TABS
500.0000 mg | ORAL_TABLET | Freq: Every day | ORAL | 0 refills | Status: DC
Start: 2023-09-17 — End: 2023-10-21

## 2023-09-17 MED ORDER — VITAMIN D (ERGOCALCIFEROL) 1.25 MG (50000 UNIT) PO CAPS: 50000.0000 [IU] | ORAL_CAPSULE | ORAL | 0 refills | Status: DC

## 2023-09-17 NOTE — Progress Notes (Signed)
 Office: [phone]  /  Fax: [phone]  WEIGHT SUMMARY AND BIOMETRICS  Anthropometric Measurements Height: 5' 4 (1.626 m) Weight: 191 lb (86.6 kg) BMI (Calculated): 32.77 Weight at Last Visit: 193 lb Weight Lost Since Last Visit: 2 lb Weight Gained Since Last Visit: 0 Starting Weight: 228 lb Total Weight Loss (lbs): 37 lb (16.8 kg) Peak Weight: 234 lb   Body Composition  Body Fat %: 42.8 % Fat Mass (lbs): 82 lbs Muscle Mass (lbs): 104.2 lbs Total Body Water (lbs): 72 lbs Visceral Fat Rating : 12   Other Clinical Data Fasting: No Labs: No Today's Visit #: 44 Starting Date: 07/20/20    Chief Complaint: OBESITY    History of Present Illness [name] is a 63 year old female with obesity and prediabetes who presents for a follow-up on her treatment plan and progress.  She is focusing on her obesity treatment plan, which includes journaling her diet with about 70-75% adherence. Despite not being able to exercise in the past month, she has lost two pounds. She is working on decreasing simple carbohydrates to help improve her glucose levels.  She is currently on metformin  for her prediabetes and requests a refill. Her recent lab results show her hemoglobin A1c is 5.4, and her fasting glucose is 97, both at goal.  She has a history of vitamin D  deficiency and is on prescription vitamin D , 50,000 IU weekly, and requests a refill. Her recent vitamin D  level is 80; previously it was 57.  She has a diagnosis of emotional eating behaviors and is on Wellbutrin  SR, 200 mg in the morning. She is working on identifying emotional eating triggers and developing strategies to manage them. Her cravings have decreased significantly, and she only experiences temptations when around certain foods or people. She is also focusing on increasing her protein intake, although she admits to 'slacking' on it. She is trying to incorporate more protein-rich foods like yogurt and  protein-rich recipes into her diet.  Family history reveals a recent loss of an aunt to a stroke. She is actively working on lifestyle changes. She has questions about her risk factors for a stroke.  No current medication problems. Reports decreased cravings and no significant side effects from current medications.      PHYSICAL EXAM:  Blood pressure 114/69, pulse (!) 53, temperature 98 F (36.7 C), height 5' 4 (1.626 m), weight 191 lb (86.6 kg), SpO2 98%. Body mass index is 32.79 kg/m.  DIAGNOSTIC DATA REVIEWED:  BMET    Component Value Date/Time   NA 140 09/10/2023 1113   K 4.3 09/10/2023 1113   CL 103 09/10/2023 1113   CO2 23 09/10/2023 1113   GLUCOSE 97 09/10/2023 1113   GLUCOSE 113 (H) 01/10/2023 1423   BUN 14 09/10/2023 1113   CREATININE 0.92 09/10/2023 1113   CREATININE 0.72 02/05/2014 1620   CALCIUM  9.8 09/10/2023 1113   GFRNONAA >60 01/10/2023 1423   GFRAA >60 04/18/2019 0205   Lab Results  Component Value Date   HGBA1C 5.4 09/10/2023   HGBA1C 6.0 (H) 07/20/2020   Lab Results  Component Value Date   INSULIN  6.2 09/10/2023   INSULIN  17.8 07/20/2020   Lab Results  Component Value Date   TSH 2.630 05/30/2022   CBC    Component Value Date/Time   WBC 6.0 01/04/2022 0829   WBC 9.2 04/18/2019 0205   RBC 4.24 01/04/2022 0829   RBC 3.77 (L) 04/18/2019 0205   HGB 13.2 01/04/2022 0829  HCT 39.6 01/04/2022 0829   PLT 310 01/04/2022 0829   MCV 93 01/04/2022 0829   MCH 31.1 01/04/2022 0829   MCH 29.7 04/18/2019 0205   MCHC 33.3 01/04/2022 0829   MCHC 32.0 04/18/2019 0205   RDW 11.8 01/04/2022 0829   Iron Studies    Component Value Date/Time   FERRITIN 1,561 (H) 04/13/2019 1050   Lipid Panel     Component Value Date/Time   CHOL 163 09/10/2023 1113   TRIG 51 09/10/2023 1113   HDL 55 09/10/2023 1113   CHOLHDL 3.3 12/23/2018 1322   LDLCALC 97 09/10/2023 1113   Hepatic Function Panel     Component Value Date/Time   PROT 6.5 09/10/2023 1113    ALBUMIN 4.2 09/10/2023 1113   AST 13 09/10/2023 1113   ALT 14 09/10/2023 1113   ALKPHOS 62 09/10/2023 1113   BILITOT 0.9 09/10/2023 1113   BILIDIR 0.18 02/20/2018 0914      Component Value Date/Time   TSH 2.630 05/30/2022 0755   Nutritional Lab Results  Component Value Date   VD25OH 80.2 09/10/2023   VD25OH 57.7 04/24/2023   VD25OH 59.8 10/24/2022     Assessment and Plan Assessment & Plan Obesity She is following the category two treatment plan for obesity, including food journaling, with 70-75% adherence. She has lost two pounds despite not exercising in the last month. She is increasing protein intake and has been provided with recipes to meet nutritional goals. Emphasis is placed on protein intake as part of her weight loss strategy. - Continue category two obesity treatment plan, including food journaling - Encourage increased protein intake - Provide protein-rich dessert recipes - Encourage continuation of diet, exercise, and weight loss efforts  Emotional Eating Behaviors She is diagnosed with emotional eating behaviors and is on Wellbutrin  SR 200 mg in the morning. She is identifying emotional eating triggers and developing management strategies. Cravings are manageable, mainly in social settings. Emphasis is placed on enjoying food while maintaining a balanced diet. - Refill Wellbutrin  SR prescription - Continue identifying emotional eating triggers and developing management strategies  Prediabetes She is treated for prediabetes, focusing on reducing simple carbohydrates and weight loss to improve glucose levels. She is on metformin , with recent labs showing hemoglobin A1c decreased from 5.7 to 5.4 and fasting glucose at 97, meeting goals. Emphasis is placed on glucose control to prevent diabetes and reduce cardiovascular risk. - Refill metformin  prescription - Continue diet, exercise, and weight loss to control glucose levels  Vitamin D  Deficiency She is on 50,000 IU  of vitamin D  weekly, with levels increased from 57 to 80. Advised to take the supplement every other week to prevent levels from exceeding 100, which can cause complications. - Refill vitamin D  prescription - Instruct to take vitamin D  every other week for the next couple of months  General Health Maintenance She is improving overall health through diet, exercise, and weight loss, with LDL cholesterol decreased from 106 to 97, meeting goals. Blood pressure is well-controlled at 114/69 mmHg. Emphasis is placed on maintaining healthy blood pressure and glucose levels to reduce cardiovascular risk, especially given her family history of stroke. - Encourage continuation of diet, exercise, and weight loss to improve overall health and decrease cardiovascular risk - Monitor blood pressure regularly - Educate on maintaining healthy blood sugar levels to reduce cardiovascular risk - Refill metformin  prescription     She was informed of the importance of frequent follow up visits to maximize her success with intensive lifestyle  modifications for her multiple health conditions.    Louann Penton, MD

## 2023-10-15 ENCOUNTER — Encounter: Payer: Self-pay | Admitting: Internal Medicine

## 2023-10-15 ENCOUNTER — Ambulatory Visit: Attending: Internal Medicine | Admitting: Internal Medicine

## 2023-10-15 VITALS — BP 110/62 | HR 56 | Ht 64.0 in | Wt 195.0 lb

## 2023-10-15 DIAGNOSIS — I493 Ventricular premature depolarization: Secondary | ICD-10-CM | POA: Diagnosis not present

## 2023-10-15 MED ORDER — METOPROLOL SUCCINATE ER 50 MG PO TB24: ORAL_TABLET | ORAL | 0 refills | Status: DC

## 2023-10-15 MED ORDER — FLECAINIDE ACETATE 150 MG PO TABS: ORAL_TABLET | ORAL | 1 refills | Status: AC

## 2023-10-15 NOTE — Progress Notes (Signed)
 HPI [name] returns today for ongoing evaluation and management of her PVC's. She is a pleasant 63 yo woman with above who has had nice control of her symptoms on flecainide  and toprol . When I saw her last she was having less than 2% PVC's on her zio on flecainide  and toprol . She denies chest pain or sob. She is no longer on eliquis . Her weight is up 5 lbs since her last visit. She notes occaisions where she feels like her heart beating irregularly.  Allergies  Allergen Reactions   Buprenorphine Hcl Itching   Morphine And Codeine Itching   Morphine Sulfate Other (See Comments)   Other Other (See Comments)    Ragweed   Short Ragweed Pollen Ext Hives     Current Outpatient Medications  Medication Sig Dispense Refill   aspirin  81 MG chewable tablet Chew 1 tablet (81 mg total) by mouth daily.     atorvastatin  (LIPITOR) 40 MG tablet Take 1 tablet by mouth once daily 90 tablet 2   buPROPion  (WELLBUTRIN  SR) 200 MG 12 hr tablet Take 1 tablet (200 mg total) by mouth every morning. 30 tablet 0   carboxymethylcellulose (REFRESH PLUS) 0.5 % SOLN 1 drop daily as needed. 2 drops each eye     chlorthalidone  (HYGROTON ) 25 MG tablet Take 1 tablet by mouth once daily 90 tablet 2   cyclobenzaprine (FLEXERIL) 10 MG tablet Take 10 mg by mouth 2 (two) times daily as needed.     estradiol (VIVELLE-DOT) 0.05 MG/24HR patch Place 1 patch onto the skin 2 (two) times a week.   0   famotidine  (PEPCID ) 20 MG tablet Take 1 tablet (20 mg total) by mouth daily. 90 tablet 3   flecainide  (TAMBOCOR ) 150 MG tablet Take 1/2 tablet by mouth 2 times per day.  Please call [phone] to schedule an August appointment for future refills. Thank you. 90 tablet 1   levalbuterol  (XOPENEX  HFA) 45 MCG/ACT inhaler Inhale 2 puffs into the lungs every 6 (six) hours as needed for wheezing. 1 each 5   metFORMIN  (GLUCOPHAGE ) 500 MG tablet Take 1 tablet (500 mg total) by mouth daily with breakfast. 30 tablet 0   metoprolol  succinate  (TOPROL -XL) 50 MG 24 hr tablet TAKE 1 TABLET BY MOUTH ONCE DAILY WITH MEALS OR  IMMEDIATELY  AFTER. Please call [phone] to schedule an August appointment for future refills. Thank you. 90 tablet 0   montelukast (SINGULAIR) 10 MG tablet Take 10 mg by mouth daily as needed.     potassium chloride  SA (KLOR-CON  M) 20 MEQ tablet Take 1 tablet (20 mEq total) by mouth daily. 90 tablet 3   Probiotic Product (PROBIOTIC-10 PO) Take by mouth as needed.     traMADol  (ULTRAM ) 50 MG tablet 1-2 tabs PO q6 hours prn pain 20 tablet 0   Vitamin D , Ergocalciferol , (DRISDOL ) 1.25 MG (50000 UNIT) CAPS capsule Take 1 capsule (50,000 Units total) by mouth every 14 (fourteen) days. 5 capsule 0   No current facility-administered medications for this visit.     Past Medical History:  Diagnosis Date   Arthritis    low back   Asthma    Back pain    Baker's cyst of knee, right    Constipation    GERD (gastroesophageal reflux disease)    Heart burn    Hyperlipidemia    Hypertension    IBS (irritable bowel syndrome)    Joint pain    Lactose intolerance    Muscle  pain    Other fatigue    Palpitations    Pre-diabetes    PVC (premature ventricular contraction)    Ringing in ear    Sleep apnea    SOB (shortness of breath) on exertion    Urticaria    Vitamin D  deficiency     ROS:   All systems reviewed and negative except as noted in the HPI.   Past Surgical History:  Procedure Laterality Date   APPENDECTOMY     carpal tunn     CESAREAN SECTION     CYST EXCISION Left 01/15/2023   Procedure: EXCISION LEFT RING FINGER ANNULAR LIGAMENT CYST;  Surgeon: Murrell Drivers, MD;  Location: [facility];  Service: Orthopedics;  Laterality: Left;  30 MIN   KNEE ARTHROSCOPY WITH LATERAL MENISECTOMY Right 03/08/2021   Procedure: RIGHT KNEE PARTIAL LATERAL MENISCECTOMY;  Surgeon: Jerri Kay HERO, MD;  Location:  SURGERY CENTER;  Service: Orthopedics;  Laterality: Right;   NEUROMA SURGERY      SEPTOPLASTY     SINOSCOPY     TONSILLECTOMY     TOTAL ABDOMINAL HYSTERECTOMY W/ BILATERAL SALPINGOOPHORECTOMY     TUBAL LIGATION       Family History  Problem Relation Age of Onset   Sudden death Mother    Asthma Father    Hypertension Father    Stroke Father    Cancer Father    Alcoholism Father    Hyperlipidemia Sister    Hyperlipidemia Brother    Allergic rhinitis Maternal Grandmother    Eczema Neg Hx    Urticaria Neg Hx      Social History   Socioeconomic History   Marital status: Married    Spouse name: Not on file   Number of children: Not on file   Years of education: Not on file   Highest education level: Not on file  Occupational History   Occupation: Stay at home spouse  Tobacco Use   Smoking status: Never   Smokeless tobacco: Never  Vaping Use   Vaping status: Never Used  Substance and Sexual Activity   Alcohol use: Not Currently   Drug use: Never   Sexual activity: Not on file  Other Topics Concern   Not on file  Social History Narrative   Tobacco use   Cigarettes: Never smoked   Tobacco history last updated 11/03/2013   No smoking   No alcohol    Yes caffeine   Recreational drug use  : no   Diet : regular    Exercise, Minimal   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   Social Drivers of Corporate investment banker Strain: Not on file  Food Insecurity: Low Risk  (01/22/2023)   Received from [facility]   Hunger Vital Sign    Within the past 12 months, you worried that your food would run out before you got money to buy more: Never true    Within the past 12 months, the food you bought just didn't last and you didn't have money to get more. : Never true  Transportation Needs: No Transportation Needs (01/22/2023)   Received from [facility]    In the past 12 months, has lack of reliable transportation kept you from  medical appointments, meetings, work or from getting things needed for daily living? : No  Physical Activity: Not on file  Stress: Not on file  Social Connections: Not on file  Intimate Partner Violence: Not on file     BP 110/62   Pulse (!) 56   Ht 5' 4 (1.626 m)   Wt 195 lb (88.5 kg)   SpO2 96%   BMI 33.47 kg/m   Physical Exam:  Well appearing NAD HEENT: Unremarkable Neck:  No JVD, no thyromegally Lymphatics:  No adenopathy Back:  No CVA tenderness Lungs:  Clear with no wheezes HEART:  Regular rate rhythm, no murmurs, no rubs, no clicks Abd:  soft, positive bowel sounds, no organomegally, no rebound, no guarding Ext:  2 plus pulses, no edema, no cyanosis, no clubbing Skin:  No rashes no nodules Neuro:  CN II through XII intact, motor grossly intact  EKG - nsr   Assess/Plan: PVC' s - she has had no worsening. On exam, no PVC's. She will continue flecainide  and toprol . 2. Obesity - I encouraged her to lose weight.  3. HTN -her bp is controlled. 4. Dyslipidemia - she will continue lipitor.      Amy Jayani Rozman,MD

## 2023-10-15 NOTE — Patient Instructions (Signed)

## 2023-10-21 ENCOUNTER — Ambulatory Visit (INDEPENDENT_AMBULATORY_CARE_PROVIDER_SITE_OTHER): Admitting: Family Medicine

## 2023-10-21 ENCOUNTER — Encounter (INDEPENDENT_AMBULATORY_CARE_PROVIDER_SITE_OTHER): Payer: Self-pay | Admitting: Family Medicine

## 2023-10-21 VITALS — BP 133/73 | HR 57 | Temp 98.7°F | Ht 64.0 in | Wt 191.0 lb

## 2023-10-21 DIAGNOSIS — R7303 Prediabetes: Secondary | ICD-10-CM | POA: Diagnosis not present

## 2023-10-21 DIAGNOSIS — E669 Obesity, unspecified: Secondary | ICD-10-CM

## 2023-10-21 DIAGNOSIS — F5089 Other specified eating disorder: Secondary | ICD-10-CM

## 2023-10-21 DIAGNOSIS — F3289 Other specified depressive episodes: Secondary | ICD-10-CM

## 2023-10-21 DIAGNOSIS — E559 Vitamin D deficiency, unspecified: Secondary | ICD-10-CM

## 2023-10-21 DIAGNOSIS — Z6832 Body mass index (BMI) 32.0-32.9, adult: Secondary | ICD-10-CM

## 2023-10-21 MED ORDER — BUPROPION HCL ER (SR) 200 MG PO TB12: 200.0000 mg | ORAL_TABLET | Freq: Every morning | ORAL | 0 refills | Status: DC

## 2023-10-21 MED ORDER — VITAMIN D (ERGOCALCIFEROL) 1.25 MG (50000 UNIT) PO CAPS: 50000.0000 [IU] | ORAL_CAPSULE | ORAL | 0 refills | Status: DC

## 2023-10-21 MED ORDER — METFORMIN HCL 500 MG PO TABS
500.0000 mg | ORAL_TABLET | Freq: Every day | ORAL | 0 refills | Status: DC
Start: 2023-10-21 — End: 2023-11-19

## 2023-10-21 NOTE — Progress Notes (Signed)
 Office: [phone]  /  Fax: [phone]  WEIGHT SUMMARY AND BIOMETRICS  Anthropometric Measurements Height: 5' 4 (1.626 m) Weight: 191 lb (86.6 kg) BMI (Calculated): 32.77 Weight at Last Visit: 195lb Weight Lost Since Last Visit: 4lb Weight Gained Since Last Visit: 0lb Starting Weight: 228lb Total Weight Loss (lbs): 41 lb (18.6 kg) Peak Weight: 234lb   Body Composition  Body Fat %: 43.3 % Fat Mass (lbs): 82.8 lbs Muscle Mass (lbs): 103 lbs Total Body Water (lbs): 71.6 lbs Visceral Fat Rating : 12   Other Clinical Data Fasting: Yes Labs: no Today's Visit #: 45 Starting Date: 07/20/20    Chief Complaint: OBESITY   History of Present Illness [name] is a 63 year old female with obesity and prediabetes who presents for a follow-up on weight management and emotional eating behaviors.  She is actively managing her obesity through dietary changes and exercise. She journals her intake, aiming for 1200 to 1500 calories and at least 85 grams of protein daily, achieving this about 65% of the time. She focuses on consuming more whole foods but struggles to meet her protein goals and drink enough water. She sometimes skips meals. Her exercise routine includes 30 minutes of activity two to three times a week and walking 10,000 steps daily. She has lost four pounds in the last month.  She is addressing emotional eating behaviors and is currently on Wellbutrin . She experiences dry mouth, which she manages with lozenges and other strategies.  She has a history of vitamin D  deficiency and is on prescription vitamin D , taken every two weeks. Her last vitamin D  level was 80.2.  She has a history of prediabetes and is managing it with diet, exercise, and weight loss. She is also on metformin . No gastrointestinal issues with this medication.  No issues with hunger and no gastrointestinal side effects from metformin . She experiences dry mouth.      PHYSICAL  EXAM:  Blood pressure 133/73, pulse (!) 57, temperature 98.7 F (37.1 C), height 5' 4 (1.626 m), weight 191 lb (86.6 kg), SpO2 97%. Body mass index is 32.79 kg/m.  DIAGNOSTIC DATA REVIEWED:  BMET    Component Value Date/Time   NA 140 09/10/2023 1113   K 4.3 09/10/2023 1113   CL 103 09/10/2023 1113   CO2 23 09/10/2023 1113   GLUCOSE 97 09/10/2023 1113   GLUCOSE 113 (H) 01/10/2023 1423   BUN 14 09/10/2023 1113   CREATININE 0.92 09/10/2023 1113   CREATININE 0.72 02/05/2014 1620   CALCIUM  9.8 09/10/2023 1113   GFRNONAA >60 01/10/2023 1423   GFRAA >60 04/18/2019 0205   Lab Results  Component Value Date   HGBA1C 5.4 09/10/2023   HGBA1C 6.0 (H) 07/20/2020   Lab Results  Component Value Date   INSULIN  6.2 09/10/2023   INSULIN  17.8 07/20/2020   Lab Results  Component Value Date   TSH 2.630 05/30/2022   CBC    Component Value Date/Time   WBC 6.0 01/04/2022 0829   WBC 9.2 04/18/2019 0205   RBC 4.24 01/04/2022 0829   RBC 3.77 (L) 04/18/2019 0205   HGB 13.2 01/04/2022 0829   HCT 39.6 01/04/2022 0829   PLT 310 01/04/2022 0829   MCV 93 01/04/2022 0829   MCH 31.1 01/04/2022 0829   MCH 29.7 04/18/2019 0205   MCHC 33.3 01/04/2022 0829   MCHC 32.0 04/18/2019 0205   RDW 11.8 01/04/2022 0829   Iron Studies    Component Value Date/Time   FERRITIN 1,561 (  H) 04/13/2019 1050   Lipid Panel     Component Value Date/Time   CHOL 163 09/10/2023 1113   TRIG 51 09/10/2023 1113   HDL 55 09/10/2023 1113   CHOLHDL 3.3 12/23/2018 1322   LDLCALC 97 09/10/2023 1113   Hepatic Function Panel     Component Value Date/Time   PROT 6.5 09/10/2023 1113   ALBUMIN 4.2 09/10/2023 1113   AST 13 09/10/2023 1113   ALT 14 09/10/2023 1113   ALKPHOS 62 09/10/2023 1113   BILITOT 0.9 09/10/2023 1113   BILIDIR 0.18 02/20/2018 0914      Component Value Date/Time   TSH 2.630 05/30/2022 0755   Nutritional Lab Results  Component Value Date   VD25OH 80.2 09/10/2023   VD25OH 57.7  04/24/2023   VD25OH 59.8 10/24/2022     Assessment and Plan Assessment & Plan Obesity Obesity with recent weight loss of four pounds over the last month. Adhering to a calorie-restricted diet of 1200-1500 calories, focusing on increasing protein intake to 85 grams per day. Meeting dietary goals approximately 65% of the time. Engaging in regular physical activity, including 30 minutes of exercise 2-3 times per week and walking 10,000 steps daily. Struggling with protein intake and hydration. Most of the four pounds lost was fat (three pounds) and one pound was water. - Continue current dietary plan with focus on increasing protein intake and hydration. - Provide additional recipes, including high-protein soups, for fall. - Encourage continuation of current exercise regimen.  Emotional eating behaviors Emotional eating behaviors managed with bupropion  (Wellbutrin ). Reports well-managed on current regimen and requests a refill. No significant side effects reported, though experiencing dry mouth, which is being managed with lozenges and other strategies. - Refill bupropion  prescription. - Discuss strategies for managing dry mouth, including sugar-free lemon drops and other salivary stimulants.  Vitamin D  deficiency Vitamin D  deficiency managed with prescription vitamin D . Recent level was 80.2, which is adequate and does not require further increase. Current regimen is every two weeks. - Refill vitamin D  prescription for 90 days, continue dosing every two weeks.  Prediabetes Prediabetes managed with lifestyle modifications and metformin . Requests a refill of metformin . No gastrointestinal issues reported with current regimen. - Refill metformin  prescription.      She was informed of the importance of frequent follow up visits to maximize her success with intensive lifestyle modifications for her multiple health conditions.    Louann Penton, MD

## 2023-10-23 ENCOUNTER — Other Ambulatory Visit: Payer: Self-pay | Admitting: Internal Medicine

## 2023-10-23 DIAGNOSIS — I493 Ventricular premature depolarization: Secondary | ICD-10-CM

## 2023-10-24 MED ORDER — METOPROLOL SUCCINATE ER 50 MG PO TB24: ORAL_TABLET | ORAL | 0 refills | Status: DC

## 2023-10-28 ENCOUNTER — Encounter: Payer: Self-pay | Admitting: Internal Medicine

## 2023-10-28 DIAGNOSIS — I493 Ventricular premature depolarization: Secondary | ICD-10-CM

## 2023-10-29 MED ORDER — METOPROLOL SUCCINATE ER 50 MG PO TB24: ORAL_TABLET | ORAL | 3 refills | Status: DC

## 2023-10-30 ENCOUNTER — Other Ambulatory Visit: Payer: Self-pay

## 2023-10-30 ENCOUNTER — Encounter: Payer: Self-pay | Admitting: Internal Medicine

## 2023-10-31 ENCOUNTER — Telehealth: Payer: Self-pay | Admitting: Internal Medicine

## 2023-10-31 NOTE — Telephone Encounter (Signed)
*  STAT* If patient is at the pharmacy, call can be transferred to refill team.   1. Which medications need to be refilled? (please list name of each medication and dose if known) metoprolol  succinate (TOPROL -XL) 50 MG 24 hr tablet    2. Would you like to learn more about the convenience, safety, & potential cost savings by using the [facility]?    3. Are you open to using the [facility] (Type [facility]. ).   4. Which pharmacy/location (including street and city if local pharmacy) is medication to be sent to?  [facility] [address] [place], [place] - [address]     5. Do they need a 30 day or 90 day supply? 90 day

## 2023-11-01 ENCOUNTER — Other Ambulatory Visit: Payer: Self-pay | Admitting: Internal Medicine

## 2023-11-01 DIAGNOSIS — I493 Ventricular premature depolarization: Secondary | ICD-10-CM

## 2023-11-01 MED ORDER — METOPROLOL SUCCINATE ER 50 MG PO TB24: ORAL_TABLET | ORAL | 3 refills | Status: DC

## 2023-11-04 NOTE — Telephone Encounter (Signed)
 Patient is following up. She says she has been completely out of medication since 7/29. She would like a call back to confirm when Rx has been sent to the pharmacy.

## 2023-11-05 MED ORDER — METOPROLOL SUCCINATE ER 50 MG PO TB24: ORAL_TABLET | ORAL | 3 refills | Status: AC

## 2023-11-05 NOTE — Telephone Encounter (Signed)
 Viewed refill that was sent on 11/01/23, however it was put on print instead of going to pharmacy.  Resent in Metoprolol  to [facility].

## 2023-11-05 NOTE — Addendum Note (Signed)
 Addended by: MEMORY DELON POUR on: 11/05/2023 07:43 AM   Modules accepted: Orders

## 2023-11-19 ENCOUNTER — Ambulatory Visit (INDEPENDENT_AMBULATORY_CARE_PROVIDER_SITE_OTHER): Admitting: Family Medicine

## 2023-11-19 ENCOUNTER — Encounter (INDEPENDENT_AMBULATORY_CARE_PROVIDER_SITE_OTHER): Payer: Self-pay | Admitting: Family Medicine

## 2023-11-19 VITALS — BP 119/74 | HR 61 | Temp 98.1°F | Ht 64.0 in | Wt 193.0 lb

## 2023-11-19 DIAGNOSIS — R7303 Prediabetes: Secondary | ICD-10-CM | POA: Diagnosis not present

## 2023-11-19 DIAGNOSIS — F3289 Other specified depressive episodes: Secondary | ICD-10-CM

## 2023-11-19 DIAGNOSIS — G4733 Obstructive sleep apnea (adult) (pediatric): Secondary | ICD-10-CM | POA: Diagnosis not present

## 2023-11-19 DIAGNOSIS — E669 Obesity, unspecified: Secondary | ICD-10-CM

## 2023-11-19 DIAGNOSIS — E785 Hyperlipidemia, unspecified: Secondary | ICD-10-CM

## 2023-11-19 DIAGNOSIS — F5089 Other specified eating disorder: Secondary | ICD-10-CM

## 2023-11-19 DIAGNOSIS — Z6833 Body mass index (BMI) 33.0-33.9, adult: Secondary | ICD-10-CM

## 2023-11-19 MED ORDER — METFORMIN HCL 500 MG PO TABS
500.0000 mg | ORAL_TABLET | Freq: Every day | ORAL | 0 refills | Status: DC
Start: 2023-11-19 — End: 2023-12-19

## 2023-11-19 MED ORDER — BUPROPION HCL ER (SR) 200 MG PO TB12: 200.0000 mg | ORAL_TABLET | Freq: Every morning | ORAL | 0 refills | Status: DC

## 2023-11-19 NOTE — Progress Notes (Signed)
 Office: [phone]  /  Fax: [phone]  WEIGHT SUMMARY AND BIOMETRICS  Anthropometric Measurements Height: 5' 4 (1.626 m) Weight: 193 lb (87.5 kg) BMI (Calculated): 33.11 Weight at Last Visit: 191lb Weight Lost Since Last Visit: 0 Weight Gained Since Last Visit: 2lb Starting Weight: 228lb Total Weight Loss (lbs): 39 lb (17.7 kg) Peak Weight: 234lb   Body Composition  Body Fat %: 44.4 % Fat Mass (lbs): 85.8 lbs Muscle Mass (lbs): 102.2 lbs Total Body Water (lbs): 73.2 lbs Visceral Fat Rating : 13   Other Clinical Data Fasting: no Labs: no Today's Visit #: 89 Starting Date: 07/20/20    Chief Complaint: OBESITY   Discussed the use of AI scribe software for clinical note transcription with the patient, who gave verbal consent to proceed.  History of Present Illness [name] is a 63 year old female with obesity and prediabetes who presents for a follow-up on her treatment plan.  She is adhering to a journaling plan with a caloric intake of 1200 to 1500 calories and a protein goal of 85 or more grams per day, but she struggles to meet her protein goal, achieving it about 65% of the time. She engages in walking and light cardio two to three times a week for 20 to 30 minutes. Despite these efforts, she has gained two pounds in the last three weeks, which she attributes to a busy month involving travel and family bereavements, leading to irregular eating habits.  She is being treated for emotional eating behaviors and is stable on Wellbutrin  SR 200 mg in the morning. Her prediabetes is managed with Glucophage  500 mg daily, alongside dietary and exercise modifications to improve glucose levels.  She experiences issues with sleep, primarily due to difficulties adjusting to her CPAP machine, which she has been using for nearly two years. She initially used a full face mask but now uses a smaller mask that covers just her nose, which she finds uncomfortable. Sleep  quality varies, with good sleep occurring two to three nights a week.  She mentions recent allergy  flare-ups, which have been bothersome. She continues to take over-the-counter aspirin  and vitamins every two weeks.      PHYSICAL EXAM:  Blood pressure 119/74, pulse 61, temperature 98.1 F (36.7 C), height 5' 4 (1.626 m), weight 193 lb (87.5 kg), SpO2 99%. Body mass index is 33.13 kg/m.  DIAGNOSTIC DATA REVIEWED:  BMET    Component Value Date/Time   NA 140 09/10/2023 1113   K 4.3 09/10/2023 1113   CL 103 09/10/2023 1113   CO2 23 09/10/2023 1113   GLUCOSE 97 09/10/2023 1113   GLUCOSE 113 (H) 01/10/2023 1423   BUN 14 09/10/2023 1113   CREATININE 0.92 09/10/2023 1113   CREATININE 0.72 02/05/2014 1620   CALCIUM  9.8 09/10/2023 1113   GFRNONAA >60 01/10/2023 1423   GFRAA >60 04/18/2019 0205   Lab Results  Component Value Date   HGBA1C 5.4 09/10/2023   HGBA1C 6.0 (H) 07/20/2020   Lab Results  Component Value Date   INSULIN  6.2 09/10/2023   INSULIN  17.8 07/20/2020   Lab Results  Component Value Date   TSH 2.630 05/30/2022   CBC    Component Value Date/Time   WBC 6.0 01/04/2022 0829   WBC 9.2 04/18/2019 0205   RBC 4.24 01/04/2022 0829   RBC 3.77 (L) 04/18/2019 0205   HGB 13.2 01/04/2022 0829   HCT 39.6 01/04/2022 0829   PLT 310 01/04/2022 0829   MCV 93 01/04/2022  0829   MCH 31.1 01/04/2022 0829   MCH 29.7 04/18/2019 0205   MCHC 33.3 01/04/2022 0829   MCHC 32.0 04/18/2019 0205   RDW 11.8 01/04/2022 0829   Iron Studies    Component Value Date/Time   FERRITIN 1,561 (H) 04/13/2019 1050   Lipid Panel     Component Value Date/Time   CHOL 163 09/10/2023 1113   TRIG 51 09/10/2023 1113   HDL 55 09/10/2023 1113   CHOLHDL 3.3 12/23/2018 1322   LDLCALC 97 09/10/2023 1113   Hepatic Function Panel     Component Value Date/Time   PROT 6.5 09/10/2023 1113   ALBUMIN 4.2 09/10/2023 1113   AST 13 09/10/2023 1113   ALT 14 09/10/2023 1113   ALKPHOS 62 09/10/2023  1113   BILITOT 0.9 09/10/2023 1113   BILIDIR 0.18 02/20/2018 0914      Component Value Date/Time   TSH 2.630 05/30/2022 0755   Nutritional Lab Results  Component Value Date   VD25OH 80.2 09/10/2023   VD25OH 57.7 04/24/2023   VD25OH 59.8 10/24/2022     Assessment and Plan Assessment & Plan Obesity with emotional eating behaviors Struggling to meet protein intake goals, achieving them about 65% of the time. Recent weight gain of 2 pounds attributed to water retention, likely due to increased sodium intake from 'grab and go' meals during a busy month. Blood pressure remains stable. - Continue journaling with 1200-1500 calories and 85 or more grams of protein daily - Encourage meal planning and preparation to maintain dietary goals - Increase awareness of protein sources, including dairy products  Prediabetes Managed with Glucophage  500 mg daily. Emphasis on diet, exercise, and weight loss to improve glucose levels. Recent lab results from June show no concerning findings. - Refill Glucophage  500 mg daily - Continue diet and exercise regimen to improve glucose control  Emotional Eating Behaviors stable on bupropion  Well-managed on Wellbutrin  SR 200 mg in the morning. No reported issues with current medication regimen. - Refill Wellbutrin  SR 200 mg daily  Obstructive sleep apnea Ongoing difficulty adjusting to CPAP therapy. Current use of nasal mask, but still experiencing discomfort. Discussed potential alternative treatments, including Inspire therapy, and encouraged to consult with her doctor about eligibility. Inspire therapy may be suitable given her body size and sleep apnea severity, but further evaluation is needed. - Encourage consultation with sleep specialist regarding alternative treatments such as Inspire therapy      She was informed of the importance of frequent follow up visits to maximize her success with intensive lifestyle modifications for her multiple health  conditions.    Louann Penton, MD

## 2023-12-06 ENCOUNTER — Other Ambulatory Visit: Payer: Self-pay | Admitting: Family Medicine

## 2023-12-06 DIAGNOSIS — Z1231 Encounter for screening mammogram for malignant neoplasm of breast: Secondary | ICD-10-CM

## 2023-12-19 ENCOUNTER — Encounter (INDEPENDENT_AMBULATORY_CARE_PROVIDER_SITE_OTHER): Payer: Self-pay | Admitting: Family Medicine

## 2023-12-19 ENCOUNTER — Ambulatory Visit (INDEPENDENT_AMBULATORY_CARE_PROVIDER_SITE_OTHER): Admitting: Family Medicine

## 2023-12-19 VITALS — BP 130/78 | HR 55 | Temp 98.4°F | Ht 64.0 in | Wt 193.0 lb

## 2023-12-19 DIAGNOSIS — E559 Vitamin D deficiency, unspecified: Secondary | ICD-10-CM | POA: Diagnosis not present

## 2023-12-19 DIAGNOSIS — Z6833 Body mass index (BMI) 33.0-33.9, adult: Secondary | ICD-10-CM

## 2023-12-19 DIAGNOSIS — E66812 Obesity, class 2: Secondary | ICD-10-CM

## 2023-12-19 DIAGNOSIS — F5089 Other specified eating disorder: Secondary | ICD-10-CM | POA: Diagnosis not present

## 2023-12-19 DIAGNOSIS — R7303 Prediabetes: Secondary | ICD-10-CM | POA: Diagnosis not present

## 2023-12-19 DIAGNOSIS — F3289 Other specified depressive episodes: Secondary | ICD-10-CM

## 2023-12-19 DIAGNOSIS — E669 Obesity, unspecified: Secondary | ICD-10-CM

## 2023-12-19 DIAGNOSIS — G4733 Obstructive sleep apnea (adult) (pediatric): Secondary | ICD-10-CM | POA: Diagnosis not present

## 2023-12-19 MED ORDER — VITAMIN D (ERGOCALCIFEROL) 1.25 MG (50000 UNIT) PO CAPS
50000.0000 [IU] | ORAL_CAPSULE | ORAL | 0 refills | Status: DC
Start: 2023-12-19 — End: 2024-01-21

## 2023-12-19 MED ORDER — METFORMIN HCL 500 MG PO TABS: 500.0000 mg | ORAL_TABLET | Freq: Every day | ORAL | 0 refills | Status: DC

## 2023-12-19 MED ORDER — BUPROPION HCL ER (SR) 200 MG PO TB12: 200.0000 mg | ORAL_TABLET | Freq: Every morning | ORAL | 0 refills | Status: DC

## 2023-12-19 NOTE — Progress Notes (Signed)
 Office: [phone]  /  Fax: [phone]  WEIGHT SUMMARY AND BIOMETRICS  Anthropometric Measurements Height: 5' 4 (1.626 m) Weight: 193 lb (87.5 kg) BMI (Calculated): 33.11 Weight at Last Visit: 193 lb Weight Lost Since Last Visit: 0 Weight Gained Since Last Visit: 0 Starting Weight: 228 lb Total Weight Loss (lbs): 35 lb (15.9 kg) Peak Weight: 234 lb   Body Composition  Body Fat %: 43.8 % Fat Mass (lbs): 84.6 lbs Muscle Mass (lbs): 103 lbs Total Body Water (lbs): 70.6 lbs Visceral Fat Rating : 12   Other Clinical Data Fasting: yes Labs: no Today's Visit #: 47 Starting Date: 07/20/20    Chief Complaint: OBESITY    History of Present Illness [name] is a 63 year old female with obesity and prediabetes who presents for obesity treatment and progress assessment.  She is following a journaling plan with a goal of consuming 120 or more grams of protein, which she meets about 70% of the time. She struggles with protein intake, which she believes may be affecting her metabolism. She walks 30 minutes two to three days per week and has maintained her weight over the last month since her last visit.  She is being treated for prediabetes with metformin  500 mg daily. In addition to medication, she is working on diet, exercise, and weight loss to manage her condition.  She is also being treated for vitamin D  deficiency with prescription vitamin D  50,000 IU per week of ergocalciferol  and requests a refill.  Her emotional eating behaviors are being managed with Wellbutrin  SR 200 mg in the morning, and she requests a refill of this medication as well.  She feels 'stuck' and experiences a lack of motivation, stating that her motivation is 'about gone.' Despite this, she has managed to maintain her weight, which she finds reassuring.  Her sleep remains unchanged.      PHYSICAL EXAM:  Blood pressure 130/78, pulse (!) 55, temperature 98.4 F (36.9 C), height 5'  4 (1.626 m), weight 193 lb (87.5 kg), SpO2 97%. Body mass index is 33.13 kg/m.  DIAGNOSTIC DATA REVIEWED:  BMET    Component Value Date/Time   NA 140 09/10/2023 1113   K 4.3 09/10/2023 1113   CL 103 09/10/2023 1113   CO2 23 09/10/2023 1113   GLUCOSE 97 09/10/2023 1113   GLUCOSE 113 (H) 01/10/2023 1423   BUN 14 09/10/2023 1113   CREATININE 0.92 09/10/2023 1113   CREATININE 0.72 02/05/2014 1620   CALCIUM  9.8 09/10/2023 1113   GFRNONAA >60 01/10/2023 1423   GFRAA >60 04/18/2019 0205   Lab Results  Component Value Date   HGBA1C 5.4 09/10/2023   HGBA1C 6.0 (H) 07/20/2020   Lab Results  Component Value Date   INSULIN  6.2 09/10/2023   INSULIN  17.8 07/20/2020   Lab Results  Component Value Date   TSH 2.630 05/30/2022   CBC    Component Value Date/Time   WBC 6.0 01/04/2022 0829   WBC 9.2 04/18/2019 0205   RBC 4.24 01/04/2022 0829   RBC 3.77 (L) 04/18/2019 0205   HGB 13.2 01/04/2022 0829   HCT 39.6 01/04/2022 0829   PLT 310 01/04/2022 0829   MCV 93 01/04/2022 0829   MCH 31.1 01/04/2022 0829   MCH 29.7 04/18/2019 0205   MCHC 33.3 01/04/2022 0829   MCHC 32.0 04/18/2019 0205   RDW 11.8 01/04/2022 0829   Iron Studies    Component Value Date/Time   FERRITIN 1,561 (H) 04/13/2019 1050  Lipid Panel     Component Value Date/Time   CHOL 163 09/10/2023 1113   TRIG 51 09/10/2023 1113   HDL 55 09/10/2023 1113   CHOLHDL 3.3 12/23/2018 1322   LDLCALC 97 09/10/2023 1113   Hepatic Function Panel     Component Value Date/Time   PROT 6.5 09/10/2023 1113   ALBUMIN 4.2 09/10/2023 1113   AST 13 09/10/2023 1113   ALT 14 09/10/2023 1113   ALKPHOS 62 09/10/2023 1113   BILITOT 0.9 09/10/2023 1113   BILIDIR 0.18 02/20/2018 0914      Component Value Date/Time   TSH 2.630 05/30/2022 0755   Nutritional Lab Results  Component Value Date   VD25OH 80.2 09/10/2023   VD25OH 57.7 04/24/2023   VD25OH 59.8 10/24/2022     Assessment and Plan Assessment & Plan Obesity  with associated emotional eating and obstructive sleep apnea Obesity management focuses on diet, exercise, and weight loss. Emotional eating is managed with Wellbutrin  SR. She reports difficulty maintaining motivation and meeting protein intake goals, affecting metabolism. She walks 30 minutes 2-3 days per week and has maintained her weight over the last month. Obstructive sleep apnea is addressed with weight loss efforts, as weight loss can reduce tongue fat and improve airway obstruction. Zepbound, a GLP-1 receptor agonist, is considered to aid weight loss and improve sleep apnea. It helps with satiety and blood sugar stabilization but may cause constipation and nausea. It is administered as a weekly injection and requires a sleep study for insurance coverage. Discussed Zepbound's mechanism, including its effects on satiety, blood sugar, and potential side effects such as constipation and nausea. Explained the rare association with thyroid cancer in rats and pancreatitis risk in humans. - Continue journaling dietary intake with a focus on protein goals. - Encourage structured meal planning with provided recipes. - Start Zepbound 2.5 mg weekly, pending insurance approval. - Use [facility] for [employer] prescription. - Continue Wellbutrin  SR 200 mg daily. - Ensure sleep study documentation is available for insurance purposes.  Prediabetes Prediabetes is managed with metformin  500 mg daily. The potential addition of Zepbound may further aid in blood sugar stabilization and prevent the onset of type 2 diabetes. - Continue metformin  500 mg daily. - Monitor blood sugar levels. - Evaluate the impact of Zepbound on blood sugar control once initiated.  Vitamin D  deficiency Vitamin D  deficiency is treated with ergocalciferol  50,000 IU weekly. She requests a refill of this medication. - Refill ergocalciferol  50,000 IU weekly.  Emotional Eating Behaviors Mood is managed with Wellbutrin  SR 200  mg daily. She reports feeling stuck and unmotivated, impacting her weight management efforts. - Refill Wellbutrin  SR 200 mg daily. - Continue monitoring mood and motivation levels.  Follow-Up Follow-up appointments are scheduled to monitor progress and adjust treatment plans as necessary. - Follow up on November 4th at 8:00 AM. - Schedule follow-up on December 10th at 9:20 AM.      [name] was informed of the importance of frequent follow up visits to maximize her success with intensive lifestyle modifications for her obesity and obesity related health conditions as recommended by USPSTF and CMS guidelines   Louann Penton, MD

## 2023-12-30 ENCOUNTER — Encounter (INDEPENDENT_AMBULATORY_CARE_PROVIDER_SITE_OTHER): Payer: Self-pay | Admitting: Family Medicine

## 2023-12-30 ENCOUNTER — Ambulatory Visit
Admission: RE | Admit: 2023-12-30 | Discharge: 2023-12-30 | Disposition: A | Discharge status: Home / Self Care | Source: Ambulatory Visit | Attending: Family Medicine | Admitting: Family Medicine

## 2023-12-30 DIAGNOSIS — G4733 Obstructive sleep apnea (adult) (pediatric): Secondary | ICD-10-CM

## 2023-12-30 DIAGNOSIS — Z1231 Encounter for screening mammogram for malignant neoplasm of breast: Secondary | ICD-10-CM

## 2024-01-06 MED ORDER — TIRZEPATIDE-WEIGHT MANAGEMENT 2.5 MG/0.5ML ~~LOC~~ SOAJ: 2.5000 mg | SUBCUTANEOUS | 0 refills | Status: DC

## 2024-01-21 ENCOUNTER — Encounter (INDEPENDENT_AMBULATORY_CARE_PROVIDER_SITE_OTHER): Payer: Self-pay | Admitting: Family Medicine

## 2024-01-21 ENCOUNTER — Ambulatory Visit (INDEPENDENT_AMBULATORY_CARE_PROVIDER_SITE_OTHER): Payer: Self-pay | Admitting: Family Medicine

## 2024-01-21 VITALS — BP 136/86 | HR 63 | Temp 98.7°F | Ht 64.0 in | Wt 193.0 lb

## 2024-01-21 DIAGNOSIS — G4733 Obstructive sleep apnea (adult) (pediatric): Secondary | ICD-10-CM | POA: Diagnosis not present

## 2024-01-21 DIAGNOSIS — E669 Obesity, unspecified: Secondary | ICD-10-CM

## 2024-01-21 DIAGNOSIS — F3289 Other specified depressive episodes: Secondary | ICD-10-CM

## 2024-01-21 DIAGNOSIS — F5089 Other specified eating disorder: Secondary | ICD-10-CM | POA: Diagnosis not present

## 2024-01-21 DIAGNOSIS — R7303 Prediabetes: Secondary | ICD-10-CM

## 2024-01-21 DIAGNOSIS — Z6833 Body mass index (BMI) 33.0-33.9, adult: Secondary | ICD-10-CM

## 2024-01-21 DIAGNOSIS — E559 Vitamin D deficiency, unspecified: Secondary | ICD-10-CM

## 2024-01-21 MED ORDER — VITAMIN D (ERGOCALCIFEROL) 1.25 MG (50000 UNIT) PO CAPS: 50000.0000 [IU] | ORAL_CAPSULE | ORAL | 0 refills | Status: DC

## 2024-01-21 MED ORDER — BUPROPION HCL ER (SR) 200 MG PO TB12: 200.0000 mg | ORAL_TABLET | Freq: Every morning | ORAL | 0 refills | Status: DC

## 2024-01-21 MED ORDER — METFORMIN HCL 500 MG PO TABS: 500.0000 mg | ORAL_TABLET | Freq: Two times a day (BID) | ORAL | 0 refills | Status: DC

## 2024-01-21 NOTE — Progress Notes (Signed)
 Office: [phone]  /  Fax: [phone]  WEIGHT SUMMARY AND BIOMETRICS  Anthropometric Measurements Height: 5' 4 (1.626 m) Weight: 193 lb (87.5 kg) BMI (Calculated): 33.11 Weight at Last Visit: 193 lb Weight Lost Since Last Visit: 0 Weight Gained Since Last Visit: 0 Starting Weight: 228 lb Total Weight Loss (lbs): 35 lb (15.9 kg) Peak Weight: 234 lb   Body Composition  Body Fat %: 44 % Fat Mass (lbs): 85 lbs Muscle Mass (lbs): 102.8 lbs Total Body Water (lbs): 71.8 lbs Visceral Fat Rating : 12   Other Clinical Data Fasting: yes Labs: no Today's Visit #: 47 Starting Date: 07/20/20    Chief Complaint: OBESITY   History of Present Illness [name] is a 63 year old female who presents for obesity treatment and progress assessment.  She is following a category two eating plan with a goal of 1200 calories and 85 or more grams of protein, adhering to this plan about 75% of the time. 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 Her weight has remained stable over the last month since her last visit.  In addition to obesity, she is being treated for prediabetes. She is working on decreasing simple carbohydrates and increasing physical activity. She is currently taking metformin  500 mg once a day and requests a refill. No significant gastrointestinal issues with metformin , though she occasionally experiences constipation, which she manages by increasing her vegetable intake and water consumption.  She has a history of vitamin D  deficiency, treated with prescription ergocalciferol . Her vitamin D  levels increased to 80, so her dosage was adjusted to 50,000 IU every two weeks. She is due for lab checks soon.  She has a diagnosis of emotional eating behaviors, separate from her obesity diagnosis, and is being treated with Wellbutrin  SR 200 mg daily. She requests a refill.  She has obstructive sleep  apnea and has been working on diet, exercise, and weight loss to manage this condition. She was prescribed Zepbound 2.5 mg weekly to treat her sleep apnea and obesity, but her insurance denied coverage for this medication.  She experiences headaches, which she attributes to stress, work, or possibly allergies. The headaches occur in the head area and may be related to dehydration.      PHYSICAL EXAM:  Blood pressure 136/86, pulse 63, temperature 98.7 F (37.1 C), height 5' 4 (1.626 m), weight 193 lb (87.5 kg), SpO2 99%. Body mass index is 33.13 kg/m.  DIAGNOSTIC DATA REVIEWED:  BMET    Component Value Date/Time   NA 140 09/10/2023 1113   K 4.3 09/10/2023 1113   CL 103 09/10/2023 1113   CO2 23 09/10/2023 1113   GLUCOSE 97 09/10/2023 1113   GLUCOSE 113 (H) 01/10/2023 1423   BUN 14 09/10/2023 1113   CREATININE 0.92 09/10/2023 1113   CREATININE 0.72 02/05/2014 1620   CALCIUM  9.8 09/10/2023 1113   GFRNONAA >60 01/10/2023 1423   GFRAA >60 04/18/2019 0205   Lab Results  Component Value Date   HGBA1C 5.4 09/10/2023   HGBA1C 6.0 (H) 07/20/2020   Lab Results  Component Value Date   INSULIN  6.2 09/10/2023   INSULIN  17.8 07/20/2020   Lab Results  Component Value Date   TSH 2.630 05/30/2022   CBC    Component Value Date/Time   WBC 6.0 01/04/2022 0829   WBC 9.2 04/18/2019 0205   RBC 4.24 01/04/2022 0829   RBC 3.77 (L) 04/18/2019  0205   HGB 13.2 01/04/2022 0829   HCT 39.6 01/04/2022 0829   PLT 310 01/04/2022 0829   MCV 93 01/04/2022 0829   MCH 31.1 01/04/2022 0829   MCH 29.7 04/18/2019 0205   MCHC 33.3 01/04/2022 0829   MCHC 32.0 04/18/2019 0205   RDW 11.8 01/04/2022 0829   Iron Studies    Component Value Date/Time   FERRITIN 1,561 (H) 04/13/2019 1050   Lipid Panel     Component Value Date/Time   CHOL 163 09/10/2023 1113   TRIG 51 09/10/2023 1113   HDL 55 09/10/2023 1113   CHOLHDL 3.3 12/23/2018 1322   LDLCALC 97 09/10/2023 1113   Hepatic Function Panel      Component Value Date/Time   PROT 6.5 09/10/2023 1113   ALBUMIN 4.2 09/10/2023 1113   AST 13 09/10/2023 1113   ALT 14 09/10/2023 1113   ALKPHOS 62 09/10/2023 1113   BILITOT 0.9 09/10/2023 1113   BILIDIR 0.18 02/20/2018 0914      Component Value Date/Time   TSH 2.630 05/30/2022 0755   Nutritional Lab Results  Component Value Date   VD25OH 80.2 09/10/2023   VD25OH 57.7 04/24/2023   VD25OH 59.8 10/24/2022     Assessment and Plan Assessment & Plan Obesity Obesity management is ongoing with a focus on dietary modifications and physical activity. She adheres to a 1200 calorie, high-protein diet 75% of the time. Emotional eating behaviors are managed with Wellbutrin  SR 200 mg daily. Challenges with meal planning due to busy schedule and stress were discussed. Emphasized the importance of regular meals to prevent metabolic slowdown. Discussed strategies for managing holiday eating and maintaining weight loss. Insurance denied coverage for Zepbound, and she will continue weight loss efforts despite this. - Continue 1200 calorie, high-protein diet. - Encouraged regular physical activity, including walking and cycling. - Discussed strategies for managing holiday eating, including portion control and reducing leftovers. - Continue weight loss efforts despite insurance denial for Zepbound.  Prediabetes Managed with dietary modifications and metformin  500 mg once daily. She is working on reducing simple carbohydrates and increasing physical activity. Discussed increasing metformin  dosage to improve glycemic control, especially with the inability to use Zepbound. Explained that metformin  helps prevent fat storage and can be beneficial for nighttime hunger. - Increased metformin  to 500 mg twice daily, ideally with breakfast and lunch. - Continue dietary modifications to reduce simple carbohydrates. - Encouraged regular physical activity.  Obstructive sleep apnea Managed with lifestyle  modifications including diet and exercise. Zepbound was prescribed but not covered by insurance. She is focusing on weight loss to improve symptoms. - Continue lifestyle modifications for weight loss. - Focus on weight loss to improve sleep apnea symptoms.  Vitamin D  deficiency Managed with ergocalciferol  50,000 IU every two weeks. Previous levels increased to 80, indicating a risk of over-replacement. - Continue ergocalciferol  50,000 IU every two weeks.  Emotional Eating Behaviors Managed with Wellbutrin  SR 200 mg daily. She is stable on this regimen with no SE noted. He is working on strategies to decrease EEB - Continue Wellbutrin  SR 200 mg daily.   [name] was counseled on the importance of maintaining healthy lifestyle habits, including balanced nutrition, regular physical activity, and behavioral modifications, while taking antiobesity medication.  Patient verbalized understanding that medication is an adjunct to, not a replacement for, lifestyle changes and that the long-term success and weight maintenance depend on continued adherence to these strategies.   [name] was informed of the importance of frequent follow up visits to maximize  her success with intensive lifestyle modifications for her obesity and obesity related health conditions as recommended by USPSTF and CMS guidelines   Louann Penton, MD

## 2024-02-26 ENCOUNTER — Ambulatory Visit (INDEPENDENT_AMBULATORY_CARE_PROVIDER_SITE_OTHER): Payer: Self-pay | Admitting: Family Medicine

## 2024-02-26 ENCOUNTER — Encounter (INDEPENDENT_AMBULATORY_CARE_PROVIDER_SITE_OTHER): Payer: Self-pay | Admitting: Family Medicine

## 2024-02-26 VITALS — BP 141/74 | HR 58 | Temp 98.2°F | Ht 64.0 in | Wt 192.0 lb

## 2024-02-26 DIAGNOSIS — F3289 Other specified depressive episodes: Secondary | ICD-10-CM

## 2024-02-26 DIAGNOSIS — I10 Essential (primary) hypertension: Secondary | ICD-10-CM

## 2024-02-26 DIAGNOSIS — F5089 Other specified eating disorder: Secondary | ICD-10-CM

## 2024-02-26 DIAGNOSIS — E669 Obesity, unspecified: Secondary | ICD-10-CM

## 2024-02-26 DIAGNOSIS — E66812 Obesity, class 2: Secondary | ICD-10-CM

## 2024-02-26 DIAGNOSIS — R7303 Prediabetes: Secondary | ICD-10-CM

## 2024-02-26 DIAGNOSIS — Z6832 Body mass index (BMI) 32.0-32.9, adult: Secondary | ICD-10-CM | POA: Diagnosis not present

## 2024-02-26 DIAGNOSIS — E559 Vitamin D deficiency, unspecified: Secondary | ICD-10-CM | POA: Diagnosis not present

## 2024-02-26 DIAGNOSIS — G4733 Obstructive sleep apnea (adult) (pediatric): Secondary | ICD-10-CM

## 2024-02-26 DIAGNOSIS — E7849 Other hyperlipidemia: Secondary | ICD-10-CM | POA: Diagnosis not present

## 2024-02-26 MED ORDER — BUPROPION HCL ER (SR) 200 MG PO TB12: 200.0000 mg | ORAL_TABLET | Freq: Every morning | ORAL | 0 refills | Status: AC

## 2024-02-26 MED ORDER — METFORMIN HCL 500 MG PO TABS: 500.0000 mg | ORAL_TABLET | Freq: Two times a day (BID) | ORAL | 0 refills | Status: DC

## 2024-02-26 MED ORDER — VITAMIN D (ERGOCALCIFEROL) 1.25 MG (50000 UNIT) PO CAPS: 50000.0000 [IU] | ORAL_CAPSULE | ORAL | 0 refills | Status: DC

## 2024-02-26 NOTE — Addendum Note (Signed)
 Addended by: LAFE BAKER CROME on: 02/26/2024 12:57 PM   Modules accepted: Orders

## 2024-02-26 NOTE — Addendum Note (Signed)
 Addended by: LAFE BAKER CROME on: 02/26/2024 10:11 AM   Modules accepted: Level of Service

## 2024-02-26 NOTE — Progress Notes (Signed)
 Office: [phone]  /  Fax: [phone]  WEIGHT SUMMARY AND BIOMETRICS  Anthropometric Measurements Height: 5' 4 (1.626 m) Weight: 192 lb (87.1 kg) BMI (Calculated): 32.94 Weight at Last Visit: 193 lb Weight Lost Since Last Visit: 1 lb Weight Gained Since Last Visit: 0 Starting Weight: 228 lb Total Weight Loss (lbs): 36 lb (16.3 kg) Peak Weight: 234 lb   Body Composition  Body Fat %: 44.3 % Fat Mass (lbs): 85.2 lbs Muscle Mass (lbs): 101.8 lbs Total Body Water (lbs): 70.8 lbs Visceral Fat Rating : 12   Other Clinical Data Fasting: yes Labs: no Today's Visit #: 48 Starting Date: 07/20/20    Chief Complaint: OBESITY    History of Present Illness [name] is a 63 year old female with obesity, emotional eating behavior, vitamin D  deficiency, prediabetes, and hypertension who presents for obesity treatment and fiber assessment.  She is following a category two eating plan with journaling for dinner, aiming for 500 calories and 35 or more grams of protein. Adherence to this plan is about 60%, and she has lost one pound over the last month, which she considers positive given the Thanksgiving holiday. She is addressing emotional eating behaviors and is on Wellbutrin  SR 200 mg per day, requesting a refill.  She has a diagnosis of vitamin D  deficiency and is on prescription ergocalciferol  50,000 IU per week, requesting a refill. She also has prediabetes and is working on diet, exercise, and weight loss to manage her blood sugars. She is on metformin  500 mg twice daily and requests a refill. She experiences gastrointestinal side effects from metformin , including diarrhea and queasiness, which sometimes affect her appetite.  She is currently on chlorthalidone  25 mg per day and metoprolol  50 mg per day, in addition to lifestyle modifications for hypertension management. She did not take her blood pressure medication this morning but reports good control at  home.  She also has hyperlipidemia and is working on diet, exercise, and weight loss.  Her stress level is currently good, but her sleep is disrupted. She experiences tiredness in the morning and sometimes naps during the day. She uses a CPAP machine but struggles with dry mouth and discomfort, sometimes removing it during the night. She aims to get at least five hours of sleep per night.      PHYSICAL EXAM:  Blood pressure (!) 141/74, pulse (!) 58, temperature 98.2 F (36.8 C), height 5' 4 (1.626 m), weight 192 lb (87.1 kg), SpO2 100%. Body mass index is 32.96 kg/m.  DIAGNOSTIC DATA REVIEWED:  BMET    Component Value Date/Time   NA 140 09/10/2023 1113   K 4.3 09/10/2023 1113   CL 103 09/10/2023 1113   CO2 23 09/10/2023 1113   GLUCOSE 97 09/10/2023 1113   GLUCOSE 113 (H) 01/10/2023 1423   BUN 14 09/10/2023 1113   CREATININE 0.92 09/10/2023 1113   CREATININE 0.72 02/05/2014 1620   CALCIUM  9.8 09/10/2023 1113   GFRNONAA >60 01/10/2023 1423   GFRAA >60 04/18/2019 0205   Lab Results  Component Value Date   HGBA1C 5.4 09/10/2023   HGBA1C 6.0 (H) 07/20/2020   Lab Results  Component Value Date   INSULIN  6.2 09/10/2023   INSULIN  17.8 07/20/2020   Lab Results  Component Value Date   TSH 2.630 05/30/2022   CBC    Component Value Date/Time   WBC 6.0 01/04/2022 0829   WBC 9.2 04/18/2019 0205   RBC 4.24 01/04/2022 0829   RBC 3.77 (L)  04/18/2019 0205   HGB 13.2 01/04/2022 0829   HCT 39.6 01/04/2022 0829   PLT 310 01/04/2022 0829   MCV 93 01/04/2022 0829   MCH 31.1 01/04/2022 0829   MCH 29.7 04/18/2019 0205   MCHC 33.3 01/04/2022 0829   MCHC 32.0 04/18/2019 0205   RDW 11.8 01/04/2022 0829   Iron Studies    Component Value Date/Time   FERRITIN 1,561 (H) 04/13/2019 1050   Lipid Panel     Component Value Date/Time   CHOL 163 09/10/2023 1113   TRIG 51 09/10/2023 1113   HDL 55 09/10/2023 1113   CHOLHDL 3.3 12/23/2018 1322   LDLCALC 97 09/10/2023 1113    Hepatic Function Panel     Component Value Date/Time   PROT 6.5 09/10/2023 1113   ALBUMIN 4.2 09/10/2023 1113   AST 13 09/10/2023 1113   ALT 14 09/10/2023 1113   ALKPHOS 62 09/10/2023 1113   BILITOT 0.9 09/10/2023 1113   BILIDIR 0.18 02/20/2018 0914      Component Value Date/Time   TSH 2.630 05/30/2022 0755   Nutritional Lab Results  Component Value Date   VD25OH 80.2 09/10/2023   VD25OH 57.7 04/24/2023   VD25OH 59.8 10/24/2022     Assessment and Plan Assessment & Plan Obesity Management is ongoing with a focus on dietary changes and weight loss. She adheres to the category two eating plan 60% of the time, with a weight loss of one pound over the last month. Challenges include gastrointestinal side effects from metformin , such as diarrhea and queasiness, which have affected her dietary adherence. She is considering strategies to manage holiday temptations and maintain weight loss. - Continue category two eating plan with journaling for dinner, 500 calories, and 35 or more grams of protein. - Adjust metformin  dosing to one pill in the morning and a half pill later to mitigate gastrointestinal side effects. - Will reassess weight management plan in January after the holiday season. - Will consider rechecking metabolism in January if weight loss stalls.  Emotional eating behavior Managed with Wellbutrin  SR 200 mg per day. She requests a refill and reports doing well with the medication. - Refilled Wellbutrin  SR 200 mg per day.  Prediabetes Managed with metformin  500 mg twice daily, diet, exercise, and weight loss. She experiences gastrointestinal side effects from metformin , which have impacted her dietary adherence. - Adjust metformin  dosing to one pill in the morning and a half pill later to mitigate gastrointestinal side effects. - Ordered labs to check blood sugar levels.  Essential hypertension Blood pressure is elevated today at 143/81 and 141/74. She is on  chlorthalidone  25 mg per day and metoprolol  50 mg per day. Home blood pressure readings are generally good. - Continue chlorthalidone  25 mg per day and metoprolol  50 mg per day. - Encouraged diet, exercise, and weight loss to improve hypertension.  Other hyperlipidemia Managed with diet, exercise, and weight loss. She is due for labs to assess cholesterol levels. - Ordered labs to check cholesterol levels. - Continue diet, exercise and weight loss as discussed today as an important part of the treatment plan   Vitamin D  deficiency Managed with ergocalciferol  50,000 IU per week. She requests a refill and reports doing well with the medication. - Refilled ergocalciferol  50,000 IU per week. - Ordered labs to check vitamin D  levels.  Obstructive sleep apnea Managed with CPAP therapy. She reports difficulty maintaining consistent use due to dry mouth and discomfort, despite using a humidifier. She aims to achieve at least  five hours of CPAP use per night. - Continue CPAP therapy with efforts to improve comfort and adherence. - Continue diet, exercise and weight loss as discussed today as an important part of the treatment plan       Patients who are on anti-obesity medications are counseled on the importance of maintaining healthy lifestyle habits, including balanced nutrition, regular physical activity, and behavioral modifications,  Medication is an adjunct to, not a replacement for, lifestyle changes and that the long-term success and weight maintenance depend on continued adherence to these strategies.   [name] was informed of the importance of frequent follow up visits to maximize her success with intensive lifestyle modifications for her obesity and obesity related health conditions as recommended by USPSTF and CMS guidelines  Louann Penton, MD

## 2024-02-27 LAB — CMP14+EGFR
ALT: 15 IU/L (ref 0–32)
AST: 14 IU/L (ref 0–40)
Albumin: 4.5 g/dL (ref 3.9–4.9)
Alkaline Phosphatase: 61 IU/L (ref 49–135)
BUN/Creatinine Ratio: 17 (ref 12–28)
BUN: 15 mg/dL (ref 8–27)
Bilirubin Total: 0.8 mg/dL (ref 0.0–1.2)
CO2: 25 mmol/L (ref 20–29)
Calcium: 9.9 mg/dL (ref 8.7–10.3)
Chloride: 103 mmol/L (ref 96–106)
Creatinine, Ser: 0.89 mg/dL (ref 0.57–1.00)
Globulin, Total: 2.3 g/dL (ref 1.5–4.5)
Glucose: 105 mg/dL — ABNORMAL HIGH (ref 70–99)
Potassium: 4 mmol/L (ref 3.5–5.2)
Sodium: 142 mmol/L (ref 134–144)
Total Protein: 6.8 g/dL (ref 6.0–8.5)
eGFR: 72 mL/min/1.73 (ref >59)

## 2024-02-27 LAB — LIPID PANEL WITH LDL/HDL RATIO
Cholesterol, Total: 208 mg/dL — ABNORMAL HIGH (ref 100–199)
HDL: 63 mg/dL (ref >39)
LDL Chol Calc (NIH): 135 mg/dL — ABNORMAL HIGH (ref 0–99)
LDL/HDL Ratio: 2.1 ratio (ref 0.0–3.2)
Triglycerides: 58 mg/dL (ref 0–149)
VLDL Cholesterol Cal: 10 mg/dL (ref 5–40)

## 2024-02-27 LAB — VITAMIN D 25 HYDROXY (VIT D DEFICIENCY, FRACTURES): Vit D, 25-Hydroxy: 51.8 ng/mL (ref 30.0–100.0)

## 2024-02-27 LAB — INSULIN, RANDOM: INSULIN: 10.4 u[IU]/mL (ref 2.6–24.9)

## 2024-02-27 LAB — HEMOGLOBIN A1C
Est. average glucose Bld gHb Est-mCnc: 111 mg/dL
Hgb A1c MFr Bld: 5.5 % (ref 4.8–5.6)

## 2024-02-27 LAB — VITAMIN B12: Vitamin B-12: 630 pg/mL (ref 232–1245)

## 2024-03-25 ENCOUNTER — Ambulatory Visit (INDEPENDENT_AMBULATORY_CARE_PROVIDER_SITE_OTHER): Admitting: Family Medicine

## 2024-03-26 ENCOUNTER — Telehealth: Payer: Self-pay

## 2024-03-26 NOTE — Telephone Encounter (Signed)
 SABRA

## 2024-03-27 ENCOUNTER — Ambulatory Visit: Attending: Cardiology | Admitting: Cardiology

## 2024-03-27 ENCOUNTER — Encounter: Payer: Self-pay | Admitting: Cardiology

## 2024-03-27 VITALS — BP 138/78 | HR 53 | Ht 64.0 in | Wt 199.7 lb

## 2024-03-27 DIAGNOSIS — I10 Essential (primary) hypertension: Secondary | ICD-10-CM

## 2024-03-27 DIAGNOSIS — G4733 Obstructive sleep apnea (adult) (pediatric): Secondary | ICD-10-CM | POA: Diagnosis not present

## 2024-03-27 NOTE — Progress Notes (Signed)
 "     Date:  03/27/2024   ID:  [name], DOB [birth year], MRN [id number]  PCP:  Dyane Anthony RAMAN, FNP   [facility] Providers Cardiologist:  Wilbert Bihari, MD Electrophysiologist:  Danelle Birmingham, MD     Evaluation Performed:  Follow-Up Visit  Chief Complaint:  OSA  History of Present Illness:    [name] is a 64 y.o. 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 This improved with reduction of HRT. She had a 2D echo in 2014 that showed normal LVEF at 65%, trivial AI and trivial MR with grade 1 DD. She is on medical therapy for her HTN and DLD with Toprol  , HCTZ and simvastatin. Both chronic conditions are followed by her PCP. Dr. Niels Web.  She was seen by Dr. Birmingham with EP in Sept for her PVCs and is on Flecainide  and metoprolol  for suppression.    She had a home sleep study April 2021 showing mild OSA with an AHI of 12.7/hr and O2 sats as low as 76%.   She ultimately was placed on CPAP at 13 cm H2O.  She is doing well with her PAP device but struggles some when she gets hot flashes at night. .  She tolerates the under the nose full face mask and feels the pressure is adequate.  Since going on PAP she feels rested in the am and has no significant daytime sleepiness.  She denies any significant  nasal dryness or nasal congestion.  She does have mouth dryness.  She does not think that she snores. An Epworth Sleepiness Scale score was calculated the office today and this endorsed at 4 arguing against residual daytime sleepiness. Patient denies any episodes of bruxism, restless legs, Hypnogognic hallucinations or cataplectic events.    Past Medical History:  Diagnosis Date   Arthritis    low back   Asthma    Back pain    Baker's cyst of knee, right    Constipation    GERD (gastroesophageal reflux disease)    Heart burn    Hyperlipidemia    Hypertension    IBS (irritable bowel syndrome)    Joint pain    Lactose  intolerance    Muscle pain    Other fatigue    Palpitations    Pre-diabetes    PVC (premature ventricular contraction)    Ringing in ear    Sleep apnea    SOB (shortness of breath) on exertion    Urticaria    Vitamin D  deficiency    Past Surgical History:  Procedure Laterality Date   APPENDECTOMY     carpal tunn     CESAREAN SECTION     CYST EXCISION Left 01/15/2023   Procedure: EXCISION LEFT RING FINGER ANNULAR LIGAMENT CYST;  Surgeon: Murrell Drivers, MD;  Location: [facility];  Service: Orthopedics;  Laterality: Left;  30 MIN   KNEE ARTHROSCOPY WITH LATERAL MENISECTOMY Right 03/08/2021   Procedure: RIGHT KNEE PARTIAL LATERAL MENISCECTOMY;  Surgeon: Jerri Kay HERO, MD;  Location:  SURGERY CENTER;  Service: Orthopedics;  Laterality: Right;   NEUROMA SURGERY     SEPTOPLASTY     SINOSCOPY     TONSILLECTOMY     TOTAL ABDOMINAL HYSTERECTOMY W/ BILATERAL SALPINGOOPHORECTOMY     TUBAL LIGATION       Current Meds  Medication Sig   aspirin  81 MG chewable tablet Chew 1 tablet (81 mg total) by  mouth daily.   atorvastatin  (LIPITOR) 40 MG tablet Take 1 tablet by mouth once daily   buPROPion  (WELLBUTRIN  SR) 200 MG 12 hr tablet Take 1 tablet (200 mg total) by mouth every morning.   carboxymethylcellulose (REFRESH PLUS) 0.5 % SOLN 1 drop daily as needed. 2 drops each eye   chlorthalidone  (HYGROTON ) 25 MG tablet Take 1 tablet by mouth once daily   cyclobenzaprine (FLEXERIL) 10 MG tablet Take 10 mg by mouth 2 (two) times daily as needed.   estradiol (VIVELLE-DOT) 0.05 MG/24HR patch Place 1 patch onto the skin 2 (two) times a week.    flecainide  (TAMBOCOR ) 150 MG tablet Take 1/2 tablet by mouth 2 times per day.  Please call [phone] to schedule an August appointment for future refills. Thank you.   levalbuterol  (XOPENEX  HFA) 45 MCG/ACT inhaler Inhale 2 puffs into the lungs every 6 (six) hours as needed for wheezing.   metFORMIN  (GLUCOPHAGE ) 500 MG tablet Take 1 tablet  (500 mg total) by mouth 2 (two) times daily with a meal.   metoprolol  succinate (TOPROL -XL) 50 MG 24 hr tablet TAKE 1 TABLET BY MOUTH ONCE DAILY WITH MEALS OR  IMMEDIATELY  AFTER.   potassium chloride  SA (KLOR-CON  M) 20 MEQ tablet Take 1 tablet (20 mEq total) by mouth daily.   Probiotic Product (PROBIOTIC-10 PO) Take by mouth as needed.   Vitamin D , Ergocalciferol , (DRISDOL ) 1.25 MG (50000 UNIT) CAPS capsule Take 1 capsule (50,000 Units total) by mouth every 14 (fourteen) days.     Allergies:   Buprenorphine hcl, Dust mite extract, Grass pollen([id number]-swt vern), Morphine and codeine, Morphine sulfate, Other, and Short ragweed pollen ext   Social History   Tobacco Use   Smoking status: Never   Smokeless tobacco: Never  Vaping Use   Vaping status: Never Used  Substance Use Topics   Alcohol use: Not Currently   Drug use: Never     Family Hx: The patient's family history includes Alcoholism in her father; Allergic rhinitis in her maternal grandmother; Asthma in her father; Cancer in her father; Hyperlipidemia in her brother and sister; Hypertension in her father; Stroke in her father; Sudden death in her mother. There is no history of Eczema, Urticaria, or Breast cancer.  ROS:   Please see the history of present illness.     All other systems reviewed and are negative.   Prior Sleep studies:   The following studies were reviewed today:  PAP compliance download  Labs/Other Tests and Data Reviewed:     Recent Labs: 02/26/2024: ALT 15; BUN 15; Creatinine, Ser 0.89; Potassium 4.0; Sodium 142   Wt Readings from Last 3 Encounters:  03/27/24 199 lb 11.2 oz (90.6 kg)  02/26/24 192 lb (87.1 kg)  01/21/24 193 lb (87.5 kg)     Risk Assessment/Calculations:      Objective:    Vital Signs:  BP 138/78   Pulse (!) 53   Ht 5' 4 (1.626 m)   Wt 199 lb 11.2 oz (90.6 kg)   SpO2 96%   BMI 34.28 kg/m    VITAL SIGNS:  reviewed GEN:  no acute distress EYES:  sclerae anicteric,  EOMI - Extraocular Movements Intact RESPIRATORY:  normal respiratory effort, symmetric expansion CARDIOVASCULAR:  no peripheral edema SKIN:  no rash, lesions or ulcers. MUSCULOSKELETAL:  no obvious deformities. NEURO:  alert and oriented x 3, no obvious focal deficit PSYCH:  normal affect  ASSESSMENT & PLAN:    OSA - The patient is tolerating PAP therapy  well without any problems. The PAP download performed by his DME was personally reviewed and interpreted by me today and showed an AHI of 0.2 /hr on 13 cm H2O with 47 % compliance in using more than 4 hours nightly.  The patient has been using and benefiting from PAP use and will continue to benefit from therapy.  - I encouraged her to be more compliant with her device - encouraged her to adjust the humidity in her device for mouth dryness  Hypertension -BP controlled on exam today -Continue chlorthalidone  25 mg daily, Toprol  XL 50 mg daily with as needed refills    Medication Adjustments/Labs and Tests Ordered: Current medicines are reviewed at length with the patient today.  Concerns regarding medicines are outlined above.   Tests Ordered: No orders of the defined types were placed in this encounter.   Medication Changes: No orders of the defined types were placed in this encounter.   Follow Up:  In Person in 1 year(s)  Signed, Wilbert Bihari, MD  03/27/2024 9:50 AM    [facility] "

## 2024-03-27 NOTE — Patient Instructions (Signed)
 Medication Instructions:  Your physician recommends that you continue on your current medications as directed. Please refer to the Current Medication list given to you today.  *If you need a refill on your cardiac medications before your next appointment, please call your pharmacy*  Lab Work: None.  If you have labs (blood work) drawn today and your tests are completely normal, you will receive your results only by: MyChart Message (if you have MyChart) OR A paper copy in the mail If you have any lab test that is abnormal or we need to change your treatment, we will call you to review the results.  Testing/Procedures: None.  Follow-Up: At [facility], you and your health needs are our priority.  As part of our continuing mission to provide you with exceptional heart care, our providers are all part of one team.  This team includes your primary Cardiologist (physician) and Advanced Practice Providers or APPs (Physician Assistants and Nurse Practitioners) who all work together to provide you with the care you need, when you need it.  Your next appointment:   1 year(s)  Provider:   Wilbert Bihari, MD    We recommend signing up for the patient portal called MyChart.  Sign up information is provided on this After Visit Summary.  MyChart is used to connect with patients for Virtual Visits (Telemedicine).  Patients are able to view lab/test results, encounter notes, upcoming appointments, etc.  Non-urgent messages can be sent to your provider as well.   To learn more about what you can do with MyChart, go to [email or web address].

## 2024-04-12 ENCOUNTER — Other Ambulatory Visit: Payer: Self-pay | Admitting: Cardiology

## 2024-04-12 ENCOUNTER — Other Ambulatory Visit: Payer: Self-pay | Admitting: Internal Medicine

## 2024-04-12 DIAGNOSIS — E7849 Other hyperlipidemia: Secondary | ICD-10-CM

## 2024-04-16 ENCOUNTER — Encounter (INDEPENDENT_AMBULATORY_CARE_PROVIDER_SITE_OTHER): Payer: Self-pay | Admitting: Family Medicine

## 2024-04-16 ENCOUNTER — Ambulatory Visit (INDEPENDENT_AMBULATORY_CARE_PROVIDER_SITE_OTHER): Admitting: Family Medicine

## 2024-04-16 VITALS — BP 113/71 | HR 69 | Temp 98.4°F | Ht 64.0 in | Wt 193.0 lb

## 2024-04-16 DIAGNOSIS — E669 Obesity, unspecified: Secondary | ICD-10-CM | POA: Diagnosis not present

## 2024-04-16 DIAGNOSIS — E559 Vitamin D deficiency, unspecified: Secondary | ICD-10-CM | POA: Diagnosis not present

## 2024-04-16 DIAGNOSIS — Z6833 Body mass index (BMI) 33.0-33.9, adult: Secondary | ICD-10-CM

## 2024-04-16 DIAGNOSIS — R7303 Prediabetes: Secondary | ICD-10-CM | POA: Diagnosis not present

## 2024-04-16 DIAGNOSIS — F3289 Other specified depressive episodes: Secondary | ICD-10-CM

## 2024-04-16 MED ORDER — VITAMIN D (ERGOCALCIFEROL) 1.25 MG (50000 UNIT) PO CAPS: 50000.0000 [IU] | ORAL_CAPSULE | ORAL | 0 refills | Status: AC

## 2024-04-16 MED ORDER — METFORMIN HCL 500 MG PO TABS: 500.0000 mg | ORAL_TABLET | Freq: Two times a day (BID) | ORAL | 0 refills | Status: AC

## 2024-04-16 MED ORDER — WEGOVY 1.5 MG PO TABS: 1.5000 mg | ORAL_TABLET | Freq: Every day | ORAL | 0 refills | Status: AC

## 2024-04-16 NOTE — Progress Notes (Signed)
 "  Office: [phone]  /  Fax: [phone]  WEIGHT SUMMARY AND BIOMETRICS  Anthropometric Measurements Height: 5' 4 (1.626 m) Weight: 193 lb (87.5 kg) BMI (Calculated): 33.11 Weight at Last Visit: 192 lb Weight Lost Since Last Visit: 0 Weight Gained Since Last Visit: 1 lb Starting Weight: 228 lb Total Weight Loss (lbs): 35 lb (15.9 kg) Peak Weight: 234 lb   Body Composition  Body Fat %: 44.6 % Fat Mass (lbs): 86.4 lbs Muscle Mass (lbs): 102 lbs Total Body Water (lbs): 70.6 lbs Visceral Fat Rating : 13   Other Clinical Data Fasting: no Labs: no Today's Visit #: 49 Starting Date: 07/20/20    Chief Complaint: OBESITY    History of Present Illness [name] is a 64 year old female with obesity, vitamin D  deficiency, and prediabetes who presents for obesity treatment.  She has been following a dietary plan of 1200 to 1500 calories with 85 mg of protein, achieving these goals about 50% of the time, including over the holidays. She exercises three to four days a week for 15 minutes on her stair stepper. Despite these efforts, she has gained one pound over the last six weeks. She feels 'stuck' and not accomplishing her goals, partly due to her husband's eating habits, which have influenced her own meal preparation and motivation.  She is being treated for vitamin D  deficiency with ergocalciferol  50,000 IU every two weeks. Her vitamin D  levels were previously at 80, and her current level is 51. She requests a refill for this medication.  She is managing prediabetes by decreasing simple carbohydrates, increasing exercise, and aiming for weight loss. She is on metformin  500 mg twice a day but is experiencing issues with pharmacy refills. Her fasting glucose was 105, and her hemoglobin A1c is 5.5. She has maintained a 35-pound weight loss over the past three years.  Her recent lab results show electrolytes, kidney and liver function, and triglyceride levels. However,  her LDL cholesterol has increased, possibly due to missed doses of Lipitor 40 mg. She uses a pill box to organize her medications but occasionally forgets doses.      PHYSICAL EXAM:  Blood pressure 113/71, pulse 69, temperature 98.4 F (36.9 C), height 5' 4 (1.626 m), weight 193 lb (87.5 kg), SpO2 97%. Body mass index is 33.13 kg/m.  DIAGNOSTIC DATA REVIEWED BY MYSELF TODAY:  BMET    Component Value Date/Time   NA 142 02/26/2024 0953   K 4.0 02/26/2024 0953   CL 103 02/26/2024 0953   CO2 25 02/26/2024 0953   GLUCOSE 105 (H) 02/26/2024 0953   GLUCOSE 113 (H) 01/10/2023 1423   BUN 15 02/26/2024 0953   CREATININE 0.89 02/26/2024 0953   CREATININE 0.72 02/05/2014 1620   CALCIUM  9.9 02/26/2024 0953   GFRNONAA >60 01/10/2023 1423   GFRAA >60 04/18/2019 0205   Lab Results  Component Value Date   HGBA1C 5.5 02/26/2024   HGBA1C 6.0 (H) 07/20/2020   Lab Results  Component Value Date   INSULIN  10.4 02/26/2024   INSULIN  17.8 07/20/2020   Lab Results  Component Value Date   TSH 2.630 05/30/2022   CBC    Component Value Date/Time   WBC 6.0 01/04/2022 0829   WBC 9.2 04/18/2019 0205   RBC 4.24 01/04/2022 0829   RBC 3.77 (L) 04/18/2019 0205   HGB 13.2 01/04/2022 0829   HCT 39.6 01/04/2022 0829   PLT 310 01/04/2022 0829   MCV 93 01/04/2022 0829   MCH 31.1  01/04/2022 0829   MCH 29.7 04/18/2019 0205   MCHC 33.3 01/04/2022 0829   MCHC 32.0 04/18/2019 0205   RDW 11.8 01/04/2022 0829   Iron Studies    Component Value Date/Time   FERRITIN 1,561 (H) 04/13/2019 1050   Lipid Panel     Component Value Date/Time   CHOL 208 (H) 02/26/2024 0953   TRIG 58 02/26/2024 0953   HDL 63 02/26/2024 0953   CHOLHDL 3.3 12/23/2018 1322   LDLCALC 135 (H) 02/26/2024 0953   Hepatic Function Panel     Component Value Date/Time   PROT 6.8 02/26/2024 0953   ALBUMIN 4.5 02/26/2024 0953   AST 14 02/26/2024 0953   ALT 15 02/26/2024 0953   ALKPHOS 61 02/26/2024 0953   BILITOT 0.8  02/26/2024 0953   BILIDIR 0.18 02/20/2018 0914      Component Value Date/Time   TSH 2.630 05/30/2022 0755   Nutritional Lab Results  Component Value Date   VD25OH 51.8 02/26/2024   VD25OH 80.2 09/10/2023   VD25OH 57.7 04/24/2023     Assessment and Plan Assessment & Plan Obesity Management is ongoing with a focus on dietary modifications and exercise. She has gained one pound over the last six months, including over the holidays, and is working on getting back on track. She exercises three to four days a week using a stair stepper for 15 minutes. She is interested in trying a new GLP-1 pill for weight loss, which is expected to help with weight loss, cardiovascular health, fatty liver, and inflammation. The pill requires strict timing for absorption and may cause queasiness or constipation if not taken properly. - Continue current dietary plan of 1200-1500 calories and 85 mg of protein. - Encouraged meal planning and prepping to meet nutritional requirements. - Prescribed GLP-1 pill for weight loss, ensuring proper timing for absorption. - Continue exercise regimen, considering increasing frequency rather than duration. - Encouraged journaling or tracking food intake to aid weight loss.  Prediabetes Managed with dietary modifications, increased exercise, and weight loss. She is on metformin  500 mg twice daily and is experiencing issues with pharmacy refills. Fasting glucose was slightly elevated at 105 mg/dL, but hemoglobin J8r is well-controlled at 5.5%. - Sent metformin  prescription to pharmacy for a 90-day supply. - Continue dietary modifications and exercise to manage blood glucose levels. - Monitor for gastrointestinal side effects of metformin , especially with carbohydrate intake.  Vitamin D  deficiency Managed with ergocalciferol  50,000 IU every two weeks. Recent labs show vitamin D  levels at 51 ng/mL, which is within the target range of 50-60 ng/mL. - Continue ergocalciferol   50,000 IU every two weeks.      Patients who are on anti-obesity medications are counseled on the importance of maintaining healthy lifestyle habits, including balanced nutrition, regular physical activity, and behavioral modifications,  Medication is an adjunct to, not a replacement for, lifestyle changes and that the long-term success and weight maintenance depend on continued adherence to these strategies.   [name] was informed of the importance of frequent follow up visits to maximize her success with intensive lifestyle modifications for her obesity and obesity related health conditions as recommended by USPSTF and CMS guidelines  Louann Penton, MD   "

## 2024-04-22 ENCOUNTER — Ambulatory Visit (INDEPENDENT_AMBULATORY_CARE_PROVIDER_SITE_OTHER): Admitting: Family Medicine

## 2024-05-13 ENCOUNTER — Ambulatory Visit (INDEPENDENT_AMBULATORY_CARE_PROVIDER_SITE_OTHER): Admitting: Family Medicine

## 2024-06-10 ENCOUNTER — Ambulatory Visit (INDEPENDENT_AMBULATORY_CARE_PROVIDER_SITE_OTHER): Admitting: Family Medicine
